# Patient Record
Sex: Female | Born: 1950 | Race: Black or African American | Hispanic: No | Smoking: Never smoker
Health system: Southern US, Community
[De-identification: ages and names within clinical notes are randomized; demographics above are authoritative.]

## PROBLEM LIST (undated history)

## (undated) DIAGNOSIS — J189 Pneumonia, unspecified organism: Secondary | ICD-10-CM

## (undated) DIAGNOSIS — E785 Hyperlipidemia, unspecified: Secondary | ICD-10-CM

## (undated) DIAGNOSIS — Z7902 Long term (current) use of antithrombotics/antiplatelets: Secondary | ICD-10-CM

## (undated) DIAGNOSIS — I2510 Atherosclerotic heart disease of native coronary artery without angina pectoris: Secondary | ICD-10-CM

## (undated) DIAGNOSIS — I447 Left bundle-branch block, unspecified: Secondary | ICD-10-CM

## (undated) DIAGNOSIS — R001 Bradycardia, unspecified: Secondary | ICD-10-CM

## (undated) DIAGNOSIS — K862 Cyst of pancreas: Secondary | ICD-10-CM

## (undated) DIAGNOSIS — M810 Age-related osteoporosis without current pathological fracture: Secondary | ICD-10-CM

## (undated) DIAGNOSIS — I441 Atrioventricular block, second degree: Secondary | ICD-10-CM

## (undated) DIAGNOSIS — I110 Hypertensive heart disease with heart failure: Secondary | ICD-10-CM

## (undated) DIAGNOSIS — D649 Anemia, unspecified: Secondary | ICD-10-CM

## (undated) DIAGNOSIS — Z7982 Long term (current) use of aspirin: Secondary | ICD-10-CM

## (undated) DIAGNOSIS — I5020 Unspecified systolic (congestive) heart failure: Secondary | ICD-10-CM

## (undated) DIAGNOSIS — R519 Headache, unspecified: Secondary | ICD-10-CM

## (undated) DIAGNOSIS — R5382 Chronic fatigue, unspecified: Secondary | ICD-10-CM

## (undated) DIAGNOSIS — I2089 Other forms of angina pectoris: Secondary | ICD-10-CM

## (undated) DIAGNOSIS — M47814 Spondylosis without myelopathy or radiculopathy, thoracic region: Secondary | ICD-10-CM

## (undated) DIAGNOSIS — D490 Neoplasm of unspecified behavior of digestive system: Secondary | ICD-10-CM

## (undated) DIAGNOSIS — Z950 Presence of cardiac pacemaker: Secondary | ICD-10-CM

## (undated) DIAGNOSIS — I509 Heart failure, unspecified: Secondary | ICD-10-CM

## (undated) DIAGNOSIS — I480 Paroxysmal atrial fibrillation: Secondary | ICD-10-CM

## (undated) DIAGNOSIS — M069 Rheumatoid arthritis, unspecified: Secondary | ICD-10-CM

## (undated) DIAGNOSIS — E559 Vitamin D deficiency, unspecified: Secondary | ICD-10-CM

## (undated) DIAGNOSIS — I10 Essential (primary) hypertension: Secondary | ICD-10-CM

## (undated) DIAGNOSIS — K8689 Other specified diseases of pancreas: Secondary | ICD-10-CM

## (undated) DIAGNOSIS — N6002 Solitary cyst of left breast: Secondary | ICD-10-CM

## (undated) DIAGNOSIS — I700 Atherosclerosis of aorta: Secondary | ICD-10-CM

## (undated) DIAGNOSIS — K219 Gastro-esophageal reflux disease without esophagitis: Secondary | ICD-10-CM

## (undated) DIAGNOSIS — I4729 Other ventricular tachycardia: Secondary | ICD-10-CM

## (undated) DIAGNOSIS — M1990 Unspecified osteoarthritis, unspecified site: Secondary | ICD-10-CM

## (undated) DIAGNOSIS — G720 Drug-induced myopathy: Secondary | ICD-10-CM

## (undated) HISTORY — PX: OTHER SURGICAL HISTORY: SHX169

## (undated) HISTORY — DX: Cyst of pancreas: K86.2

## (undated) HISTORY — DX: Essential (primary) hypertension: I10

## (undated) HISTORY — PX: HAND SURGERY: SHX662

## (undated) HISTORY — DX: Heart failure, unspecified: I50.9

---

## 1983-09-23 HISTORY — PX: ABDOMINAL HYSTERECTOMY: SHX81

## 2000-04-08 ENCOUNTER — Ambulatory Visit (HOSPITAL_COMMUNITY): Admission: RE | Admit: 2000-04-08 | Discharge: 2000-04-08 | Payer: Self-pay | Admitting: Internal Medicine

## 2000-09-22 HISTORY — PX: BREAST BIOPSY: SHX20

## 2002-08-31 ENCOUNTER — Encounter: Payer: Self-pay | Admitting: Internal Medicine

## 2002-08-31 ENCOUNTER — Ambulatory Visit (HOSPITAL_COMMUNITY): Admission: RE | Admit: 2002-08-31 | Discharge: 2002-08-31 | Payer: Self-pay | Admitting: Internal Medicine

## 2004-07-12 ENCOUNTER — Other Ambulatory Visit: Payer: Self-pay

## 2004-07-19 ENCOUNTER — Ambulatory Visit: Payer: Self-pay | Admitting: Internal Medicine

## 2004-08-22 ENCOUNTER — Ambulatory Visit: Payer: Self-pay | Admitting: Specialist

## 2005-02-03 ENCOUNTER — Ambulatory Visit: Payer: Self-pay | Admitting: Internal Medicine

## 2005-02-04 ENCOUNTER — Ambulatory Visit: Payer: Self-pay | Admitting: Internal Medicine

## 2005-03-12 ENCOUNTER — Ambulatory Visit: Payer: Self-pay

## 2006-01-22 ENCOUNTER — Ambulatory Visit: Payer: Self-pay

## 2006-02-12 ENCOUNTER — Ambulatory Visit: Payer: Self-pay | Admitting: Gastroenterology

## 2006-02-16 ENCOUNTER — Ambulatory Visit: Payer: Self-pay | Admitting: Vascular Surgery

## 2006-03-19 ENCOUNTER — Ambulatory Visit: Payer: Self-pay | Admitting: Gastroenterology

## 2006-04-08 ENCOUNTER — Ambulatory Visit: Payer: Self-pay

## 2006-12-10 ENCOUNTER — Ambulatory Visit: Payer: Self-pay

## 2007-04-06 ENCOUNTER — Ambulatory Visit: Payer: Self-pay

## 2007-05-15 ENCOUNTER — Emergency Department: Payer: Self-pay | Admitting: Emergency Medicine

## 2007-09-25 ENCOUNTER — Ambulatory Visit: Payer: Self-pay | Admitting: Family Medicine

## 2007-12-24 ENCOUNTER — Observation Stay: Payer: Self-pay | Admitting: Internal Medicine

## 2007-12-24 ENCOUNTER — Other Ambulatory Visit: Payer: Self-pay

## 2008-04-04 ENCOUNTER — Ambulatory Visit: Payer: Self-pay | Admitting: Internal Medicine

## 2008-09-22 HISTORY — PX: INCISION AND DRAINAGE: SHX5863

## 2009-04-18 ENCOUNTER — Ambulatory Visit: Payer: Self-pay | Admitting: Family Medicine

## 2009-07-10 ENCOUNTER — Ambulatory Visit: Payer: Self-pay

## 2009-12-13 ENCOUNTER — Ambulatory Visit: Payer: Self-pay | Admitting: Internal Medicine

## 2010-04-18 ENCOUNTER — Ambulatory Visit: Payer: Self-pay | Admitting: Internal Medicine

## 2010-07-01 ENCOUNTER — Ambulatory Visit: Payer: Self-pay | Admitting: Internal Medicine

## 2010-09-25 ENCOUNTER — Emergency Department: Payer: Self-pay | Admitting: Emergency Medicine

## 2011-04-22 ENCOUNTER — Ambulatory Visit: Payer: Self-pay | Admitting: Internal Medicine

## 2011-06-10 ENCOUNTER — Emergency Department: Payer: Self-pay | Admitting: Unknown Physician Specialty

## 2011-07-21 ENCOUNTER — Ambulatory Visit: Payer: Self-pay | Admitting: Gastroenterology

## 2011-08-25 ENCOUNTER — Ambulatory Visit: Payer: Self-pay | Admitting: Gastroenterology

## 2011-11-12 IMAGING — NM NUCLEAR MEDICINE HEPATOHBILIARY INCLUDE GB
3 series · 20 of 20 positions shown · non-contrast
Comparison: none

REASON FOR EXAM: abd pain LUQ  unspecified constipation  nausea w vomiting
COMMENTS:

[Series 1000: gallbladder dynamic · 4.80mm/px · 6 of 60 frames shown]
[frame 6/60]
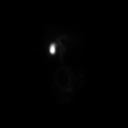
[frame 16/60]
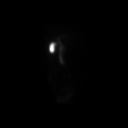
[frame 26/60]
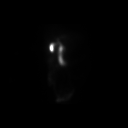
[frame 36/60]
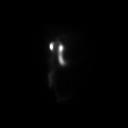
[frame 46/60]
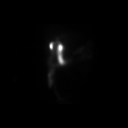
[frame 56/60]
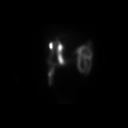

[Series 1000: gallbladder dynamic (results) · 4.80mm/px · 6 of 60 frames shown]
[frame 6/60]
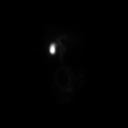
[frame 16/60]
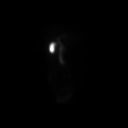
[frame 26/60]
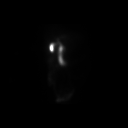
[frame 36/60]
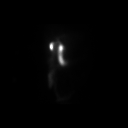
[frame 46/60]
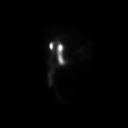
[frame 56/60]
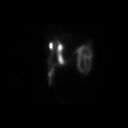

[Series 1000: gallbladder statics · 4.80mm/px · 8 of 8 slices shown]
[im 1/8]
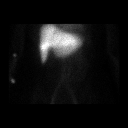
[im 2/8]
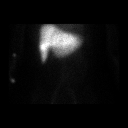
[im 3/8]
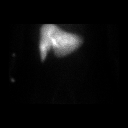
[im 4/8]
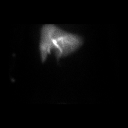
[im 5/8]
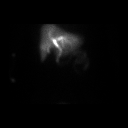
[im 6/8]
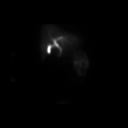
[im 7/8]
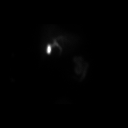
[im 8/8]
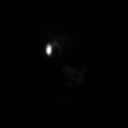

[20 of 20 positions shown; findings below may reference images not displayed]

PROCEDURE:     KNM - KNM HEPATO W/GB EJECT FRACTION  - July 21, 2011 [DATE]

RESULT:     Following intravenous administration of 8.29 mCi technetium 99m
Choletec, there is noted prompt visualization of tracer activity in the
liver at 3 minutes. At 30 minutes, tracer activity is visualized in the
gallbladder, common duct and proximal small bowel.

The gallbladder ejection fraction measures 85% which is in the normal range.
IMPRESSION: 1.  Normal Hepatobiliary Scan.
2.  Gallbladder ejection fraction measures 85% which is in the normal range.

## 2012-03-24 ENCOUNTER — Ambulatory Visit: Payer: Self-pay | Admitting: Sports Medicine

## 2012-04-22 ENCOUNTER — Ambulatory Visit: Payer: Self-pay | Admitting: Internal Medicine

## 2012-12-22 ENCOUNTER — Ambulatory Visit: Payer: Self-pay | Admitting: Family Medicine

## 2013-01-11 ENCOUNTER — Ambulatory Visit: Payer: Self-pay | Admitting: Internal Medicine

## 2013-03-31 ENCOUNTER — Ambulatory Visit: Payer: Self-pay | Admitting: Internal Medicine

## 2013-04-26 ENCOUNTER — Ambulatory Visit: Payer: Self-pay | Admitting: Internal Medicine

## 2013-12-23 ENCOUNTER — Ambulatory Visit: Payer: Self-pay | Admitting: Internal Medicine

## 2013-12-28 ENCOUNTER — Ambulatory Visit: Payer: Self-pay | Admitting: Internal Medicine

## 2014-07-12 ENCOUNTER — Ambulatory Visit: Payer: Self-pay | Admitting: Internal Medicine

## 2014-07-13 ENCOUNTER — Ambulatory Visit: Payer: Self-pay | Admitting: Internal Medicine

## 2014-07-13 DIAGNOSIS — I639 Cerebral infarction, unspecified: Secondary | ICD-10-CM

## 2014-07-13 HISTORY — DX: Cerebral infarction, unspecified: I63.9

## 2014-07-21 DIAGNOSIS — I340 Nonrheumatic mitral (valve) insufficiency: Secondary | ICD-10-CM | POA: Insufficient documentation

## 2014-07-28 ENCOUNTER — Ambulatory Visit: Payer: Self-pay | Admitting: Internal Medicine

## 2014-08-11 DIAGNOSIS — Z8739 Personal history of other diseases of the musculoskeletal system and connective tissue: Secondary | ICD-10-CM | POA: Insufficient documentation

## 2014-09-01 ENCOUNTER — Ambulatory Visit: Payer: Self-pay | Admitting: Internal Medicine

## 2014-09-08 DIAGNOSIS — R1013 Epigastric pain: Secondary | ICD-10-CM | POA: Insufficient documentation

## 2014-09-08 DIAGNOSIS — K8689 Other specified diseases of pancreas: Secondary | ICD-10-CM | POA: Insufficient documentation

## 2014-09-08 DIAGNOSIS — R932 Abnormal findings on diagnostic imaging of liver and biliary tract: Secondary | ICD-10-CM | POA: Insufficient documentation

## 2014-09-13 DIAGNOSIS — R001 Bradycardia, unspecified: Secondary | ICD-10-CM | POA: Insufficient documentation

## 2014-09-18 ENCOUNTER — Ambulatory Visit: Payer: Self-pay | Admitting: Internal Medicine

## 2014-09-18 ENCOUNTER — Ambulatory Visit: Payer: Self-pay | Admitting: Unknown Physician Specialty

## 2014-09-22 HISTORY — PX: FOOT SURGERY: SHX648

## 2014-10-19 ENCOUNTER — Ambulatory Visit: Payer: Self-pay | Admitting: Gastroenterology

## 2014-10-23 LAB — AMYLASE, BODY FLUID: Amylase, Body Fluid: 9970 U/L

## 2014-12-25 DIAGNOSIS — K862 Cyst of pancreas: Secondary | ICD-10-CM | POA: Insufficient documentation

## 2015-01-15 LAB — SURGICAL PATHOLOGY

## 2015-01-15 LAB — CYTOLOGY - NON PAP

## 2015-03-07 DIAGNOSIS — I10 Essential (primary) hypertension: Secondary | ICD-10-CM | POA: Insufficient documentation

## 2015-03-20 DIAGNOSIS — R42 Dizziness and giddiness: Secondary | ICD-10-CM | POA: Insufficient documentation

## 2015-03-20 DIAGNOSIS — I493 Ventricular premature depolarization: Secondary | ICD-10-CM | POA: Insufficient documentation

## 2015-03-28 ENCOUNTER — Encounter: Payer: Self-pay | Admitting: General Surgery

## 2015-03-28 ENCOUNTER — Ambulatory Visit (INDEPENDENT_AMBULATORY_CARE_PROVIDER_SITE_OTHER): Payer: PRIVATE HEALTH INSURANCE | Admitting: General Surgery

## 2015-03-28 VITALS — BP 140/80 | HR 74 | Resp 12 | Ht 66.0 in | Wt 193.0 lb

## 2015-03-28 DIAGNOSIS — N611 Abscess of the breast and nipple: Secondary | ICD-10-CM

## 2015-03-28 DIAGNOSIS — N61 Inflammatory disorders of breast: Secondary | ICD-10-CM

## 2015-03-28 NOTE — Progress Notes (Signed)
Patient ID: Sara Hodges, female   DOB: 12/31/1950, 64 y.o.   MRN: [id number]  Chief Complaint  Patient presents with  . Other    cyst under left breast    HPI Sara Hodges is a 64 y.o. female here for a recurrent inflamed sebaceous cyst under left breast. She is currently on antibiotics for this. Patient states she noticed this about a week ago. The area is sore but not draining. Patient has had this area drained at [facility] about 5 years ago.  HPI  Past Medical History  Diagnosis Date  . Pancreatic cyst   . Hypertension   . CHF (congestive heart failure)     Past Surgical History  Procedure Laterality Date  . Abdominal hysterectomy  1985  . Hand surgery    . Foot surgery  2016  . Incision and drainage Left 2010    breast     Family History  Problem Relation Age of Onset  . Lung cancer Mother     Social History History  Substance Use Topics  . Smoking status: Never Smoker   . Smokeless tobacco: Never Used  . Alcohol Use: No    Allergies  Allergen Reactions  . Amoxicillin-Pot Clavulanate Nausea Only    Current Outpatient Prescriptions  Medication Sig Dispense Refill  . aspirin EC 81 MG tablet Take 81 mg by mouth daily.     Marland Kitchen doxycycline (VIBRAMYCIN) 100 MG capsule Take 100 mg by mouth 2 (two) times daily.     . folic acid (FOLVITE) 1 MG tablet Take 1 mg by mouth daily.     . furosemide (LASIX) 20 MG tablet Take 20 mg by mouth as needed.     Marland Kitchen lisinopril (PRINIVIL,ZESTRIL) 20 MG tablet Take 20 mg by mouth daily.     Marland Kitchen scopolamine (TRANSDERM-SCOP) 1 MG/3DAYS Place 1 patch onto the skin once as needed.     Marland Kitchen spironolactone (ALDACTONE) 25 MG tablet Take 25 mg by mouth daily.     . Vitamin D, Ergocalciferol, (DRISDOL) 50000 UNITS CAPS capsule Take by mouth.     No current facility-administered medications for this visit.    Review of Systems Review of Systems  Constitutional: Negative.   Respiratory: Negative.   Cardiovascular: Negative.     Blood pressure  140/80, pulse 74, resp. rate 12, height  (1.676 m), weight 193 lb (87.544 kg).  Physical Exam Physical Exam  Constitutional: She is oriented to person, place, and time. She appears well-developed and well-nourished.  HENT:  Mouth/Throat: Oropharynx is clear and moist.  Eyes: Conjunctivae are normal. No scleral icterus.  Neck: Neck supple.  Pulmonary/Chest: Effort normal and breath sounds normal. Left breast exhibits mass. Left breast exhibits no inverted nipple, no nipple discharge and no skin change.    Lymphadenopathy:    She has no cervical adenopathy.    She has axillary adenopathy (left mid-axillary node 2.5 cm firm, mobile. Mildly tender.).  Neurological: She is alert and oriented to person, place, and time.  Skin: Skin is warm and dry.    Data Reviewed    Assessment    Abscess of left breast-likely cutaneous cyst.  I and D recommended and pt was agreeable.    Plan   Procedure note.                               The abscess area was prepped with chloro prep and draped. 1ml 1%  xylocaine instilled. Cruciate incision was made and 3ml of thick yellow pus and some cyst contents drained. Corners of incision trimmed.  Dressed with 4/4s.  Continue doxycycline BID x 10 days.      PCP: Dr Fraser DinJeffrey Sparks   Hudsyn Barich G 03/28/2015, 6:29 PM

## 2015-03-28 NOTE — Patient Instructions (Signed)
Continue antibiotics. Keep dressing on it. The patient is aware to call back for any questions or concerns.

## 2015-04-11 ENCOUNTER — Encounter: Payer: Self-pay | Admitting: General Surgery

## 2015-04-11 ENCOUNTER — Ambulatory Visit (INDEPENDENT_AMBULATORY_CARE_PROVIDER_SITE_OTHER): Payer: PRIVATE HEALTH INSURANCE | Admitting: General Surgery

## 2015-04-11 VITALS — BP 152/84 | HR 82 | Resp 12 | Ht 66.0 in | Wt 192.0 lb

## 2015-04-11 DIAGNOSIS — L723 Sebaceous cyst: Secondary | ICD-10-CM | POA: Diagnosis not present

## 2015-04-11 DIAGNOSIS — L089 Local infection of the skin and subcutaneous tissue, unspecified: Secondary | ICD-10-CM

## 2015-04-11 MED ORDER — DOXYCYCLINE HYCLATE 100 MG PO CAPS: 100.0000 mg | ORAL_CAPSULE | Freq: Two times a day (BID) | ORAL | Status: DC

## 2015-04-11 NOTE — Patient Instructions (Signed)
The patient is aware to call back for any questions or concerns.  

## 2015-04-11 NOTE — Progress Notes (Signed)
Here today for follow up left breast abscess. She has finished the antibiotics. She states the area is still draining some, soreness is improving. The prior large abscess is now down to a 1.5cm cutaneous cyst-small opening where some cyst contents noted. Also saw a couple of drops of pus.  Infected sebaceous cyst.  Doxycycline 100 mg for 2 weeks. Follow up in 2 weeks for excision.

## 2015-04-26 ENCOUNTER — Ambulatory Visit (INDEPENDENT_AMBULATORY_CARE_PROVIDER_SITE_OTHER): Payer: PRIVATE HEALTH INSURANCE | Admitting: General Surgery

## 2015-04-26 ENCOUNTER — Encounter: Payer: Self-pay | Admitting: General Surgery

## 2015-04-26 VITALS — BP 142/64 | HR 80 | Resp 12 | Ht 66.0 in | Wt 195.0 lb

## 2015-04-26 DIAGNOSIS — N6082 Other benign mammary dysplasias of left breast: Secondary | ICD-10-CM

## 2015-04-26 NOTE — Patient Instructions (Addendum)
Monitor the area Call if symptoms appear The patient is aware to call back for any questions or concerns.

## 2015-04-26 NOTE — Progress Notes (Signed)
Here today for excision of a left breast mass. Pt states the area in left breast is no longer visible or palpable. Exam shows no residual cyst in left breast where she had recent infection.  Only a 4mm scar is seen at site of prior drainage.No underlying palpable finding. Advised to call if  there is any recurrence of the same.

## 2015-06-03 DIAGNOSIS — K862 Cyst of pancreas: Secondary | ICD-10-CM | POA: Insufficient documentation

## 2015-08-02 DIAGNOSIS — I43 Cardiomyopathy in diseases classified elsewhere: Secondary | ICD-10-CM

## 2015-08-02 DIAGNOSIS — I110 Hypertensive heart disease with heart failure: Secondary | ICD-10-CM | POA: Insufficient documentation

## 2015-08-31 ENCOUNTER — Other Ambulatory Visit: Payer: Self-pay | Admitting: Internal Medicine

## 2015-08-31 DIAGNOSIS — Z1231 Encounter for screening mammogram for malignant neoplasm of breast: Secondary | ICD-10-CM

## 2015-09-20 ENCOUNTER — Ambulatory Visit
Admission: RE | Admit: 2015-09-20 | Discharge: 2015-09-20 | Disposition: A | Discharge status: Home / Self Care | Payer: Medicare Other | Source: Ambulatory Visit | Attending: Internal Medicine | Admitting: Internal Medicine

## 2015-09-20 DIAGNOSIS — Z1231 Encounter for screening mammogram for malignant neoplasm of breast: Secondary | ICD-10-CM | POA: Diagnosis present

## 2015-11-19 ENCOUNTER — Other Ambulatory Visit: Payer: Self-pay | Admitting: Emergency Medicine

## 2015-11-20 LAB — CULTURE, BLOOD (ROUTINE X 2)
CULTURE: NO GROWTH
CULTURE: NO GROWTH
Culture: NO GROWTH
Culture: NO GROWTH

## 2015-11-20 LAB — URINE CULTURE: Culture: >=100000

## 2015-11-21 LAB — CULTURE, BLOOD (ROUTINE X 2)
CULTURE: NO GROWTH
CULTURE: NO GROWTH
Culture: NO GROWTH
Culture: NO GROWTH
Culture: NO GROWTH

## 2015-11-22 LAB — URINE CULTURE
Culture: >=100000
Culture: >=100000

## 2015-11-23 LAB — CULTURE, BLOOD (ROUTINE X 2)
CULTURE: NO GROWTH
Culture: NO GROWTH

## 2015-11-24 LAB — CULTURE, BLOOD (ROUTINE X 2)
CULTURE: NO GROWTH
CULTURE: NO GROWTH
CULTURE: NO GROWTH
Culture: NO GROWTH

## 2016-03-25 ENCOUNTER — Encounter: Payer: Self-pay | Admitting: Emergency Medicine

## 2016-03-25 ENCOUNTER — Emergency Department: Payer: Medicare HMO

## 2016-03-25 ENCOUNTER — Inpatient Hospital Stay
Admission: EM | Admit: 2016-03-25 | Discharge: 2016-03-27 | DRG: 871 | Disposition: A | Discharge status: Home / Self Care | Payer: Medicare HMO | Attending: Internal Medicine | Admitting: Internal Medicine

## 2016-03-25 DIAGNOSIS — J189 Pneumonia, unspecified organism: Secondary | ICD-10-CM | POA: Diagnosis present

## 2016-03-25 DIAGNOSIS — I110 Hypertensive heart disease with heart failure: Secondary | ICD-10-CM | POA: Diagnosis present

## 2016-03-25 DIAGNOSIS — K862 Cyst of pancreas: Secondary | ICD-10-CM | POA: Diagnosis present

## 2016-03-25 DIAGNOSIS — A419 Sepsis, unspecified organism: Secondary | ICD-10-CM | POA: Diagnosis present

## 2016-03-25 DIAGNOSIS — E785 Hyperlipidemia, unspecified: Secondary | ICD-10-CM | POA: Diagnosis present

## 2016-03-25 DIAGNOSIS — I2510 Atherosclerotic heart disease of native coronary artery without angina pectoris: Secondary | ICD-10-CM | POA: Diagnosis present

## 2016-03-25 DIAGNOSIS — Z801 Family history of malignant neoplasm of trachea, bronchus and lung: Secondary | ICD-10-CM | POA: Diagnosis not present

## 2016-03-25 DIAGNOSIS — I5042 Chronic combined systolic (congestive) and diastolic (congestive) heart failure: Secondary | ICD-10-CM | POA: Diagnosis present

## 2016-03-25 DIAGNOSIS — I10 Essential (primary) hypertension: Secondary | ICD-10-CM | POA: Diagnosis present

## 2016-03-25 DIAGNOSIS — J690 Pneumonitis due to inhalation of food and vomit: Secondary | ICD-10-CM

## 2016-03-25 DIAGNOSIS — K529 Noninfective gastroenteritis and colitis, unspecified: Secondary | ICD-10-CM

## 2016-03-25 DIAGNOSIS — Z880 Allergy status to penicillin: Secondary | ICD-10-CM

## 2016-03-25 DIAGNOSIS — Z7982 Long term (current) use of aspirin: Secondary | ICD-10-CM | POA: Diagnosis not present

## 2016-03-25 HISTORY — DX: Hyperlipidemia, unspecified: E78.5

## 2016-03-25 HISTORY — DX: Atherosclerotic heart disease of native coronary artery without angina pectoris: I25.10

## 2016-03-25 HISTORY — DX: Other specified diseases of pancreas: K86.89

## 2016-03-25 LAB — COMPREHENSIVE METABOLIC PANEL
ALBUMIN: 3.7 g/dL (ref 3.5–5.0)
ALK PHOS: 71 U/L (ref 38–126)
ALT: 56 U/L — AB (ref 14–54)
ANION GAP: 13 (ref 5–15)
AST: 54 U/L — AB (ref 15–41)
BUN: 16 mg/dL (ref 6–20)
CALCIUM: 8.9 mg/dL (ref 8.9–10.3)
CO2: 24 mmol/L (ref 22–32)
CREATININE: 1.06 mg/dL — AB (ref 0.44–1.00)
Chloride: 98 mmol/L — ABNORMAL LOW (ref 101–111)
GFR calc Af Amer: >60 mL/min (ref >60)
GFR calc non Af Amer: 54 mL/min — ABNORMAL LOW (ref >60)
GLUCOSE: 118 mg/dL — AB (ref 65–99)
Potassium: 3.8 mmol/L (ref 3.5–5.1)
SODIUM: 135 mmol/L (ref 135–145)
Total Bilirubin: 1.2 mg/dL (ref 0.3–1.2)
Total Protein: 8.6 g/dL — ABNORMAL HIGH (ref 6.5–8.1)

## 2016-03-25 LAB — URINALYSIS COMPLETE WITH MICROSCOPIC (ARMC ONLY)
Bilirubin Urine: NEGATIVE
Cellular Cast, UA: 3
Glucose, UA: 50 mg/dL — AB
Nitrite: NEGATIVE
PH: 5 (ref 5.0–8.0)
SPECIFIC GRAVITY, URINE: 1.024 (ref 1.005–1.030)

## 2016-03-25 LAB — CBC WITH DIFFERENTIAL/PLATELET
Basophils Absolute: 0 10*3/uL (ref 0–0.1)
Basophils Relative: 0 %
EOS ABS: 0 10*3/uL (ref 0–0.7)
Eosinophils Relative: 0 %
HCT: 38 % (ref 35.0–47.0)
HEMOGLOBIN: 13 g/dL (ref 12.0–16.0)
LYMPHS ABS: 0.5 10*3/uL — AB (ref 1.0–3.6)
Lymphocytes Relative: 6 %
MCH: 32.6 pg (ref 26.0–34.0)
MCHC: 34.3 g/dL (ref 32.0–36.0)
MCV: 95.1 fL (ref 80.0–100.0)
MONO ABS: 0.7 10*3/uL (ref 0.2–0.9)
MONOS PCT: 9 %
NEUTROS PCT: 85 %
Neutro Abs: 6.3 10*3/uL (ref 1.4–6.5)
Platelets: 164 10*3/uL (ref 150–440)
RBC: 4 MIL/uL (ref 3.80–5.20)
RDW: 12.9 % (ref 11.5–14.5)
WBC: 7.4 10*3/uL (ref 3.6–11.0)

## 2016-03-25 LAB — TROPONIN I
TROPONIN I: 0.06 ng/mL — AB (ref <0.03)
Troponin I: <0.03 ng/mL (ref <0.03)

## 2016-03-25 LAB — LACTIC ACID, PLASMA: LACTIC ACID, VENOUS: 0.7 mmol/L (ref 0.5–1.9)

## 2016-03-25 LAB — BRAIN NATRIURETIC PEPTIDE: B NATRIURETIC PEPTIDE 5: 133 pg/mL — AB (ref 0.0–100.0)

## 2016-03-25 LAB — LIPASE, BLOOD: Lipase: 22 U/L (ref 11–51)

## 2016-03-25 MED ORDER — ENOXAPARIN SODIUM 40 MG/0.4ML ~~LOC~~ SOLN
40.0000 mg | SUBCUTANEOUS | Status: DC
  Administered 2016-03-25 – 2016-03-26 (×2): 40 mg via SUBCUTANEOUS
  Filled 2016-03-25 (×2): qty 0.4

## 2016-03-25 MED ORDER — ONDANSETRON 8 MG PO TBDP: 8.0000 mg | ORAL_TABLET | Freq: Once | ORAL | Status: DC

## 2016-03-25 MED ORDER — ONDANSETRON HCL 4 MG PO TABS: 4.0000 mg | ORAL_TABLET | Freq: Four times a day (QID) | ORAL | Status: DC | PRN

## 2016-03-25 MED ORDER — ONDANSETRON HCL 4 MG/2ML IJ SOLN
4.0000 mg | Freq: Once | INTRAMUSCULAR | Status: AC
  Administered 2016-03-25: 4 mg via INTRAVENOUS
  Filled 2016-03-25: qty 2

## 2016-03-25 MED ORDER — ACETAMINOPHEN 650 MG RE SUPP: 650.0000 mg | Freq: Four times a day (QID) | RECTAL | Status: DC | PRN

## 2016-03-25 MED ORDER — BENZONATATE 100 MG PO CAPS
200.0000 mg | ORAL_CAPSULE | Freq: Three times a day (TID) | ORAL | Status: DC | PRN
  Administered 2016-03-26: 200 mg via ORAL
  Filled 2016-03-25: qty 2

## 2016-03-25 MED ORDER — ACETAMINOPHEN 325 MG PO TABS
650.0000 mg | ORAL_TABLET | Freq: Four times a day (QID) | ORAL | Status: DC | PRN
Start: 2016-03-25 — End: 2016-03-27
  Administered 2016-03-26 – 2016-03-27 (×2): 650 mg via ORAL
  Filled 2016-03-25 (×2): qty 2

## 2016-03-25 MED ORDER — DEXTROSE 5 % IV SOLN
500.0000 mg | INTRAVENOUS | Status: DC
  Administered 2016-03-26: 23:00:00 500 mg via INTRAVENOUS
  Filled 2016-03-25 (×2): qty 500

## 2016-03-25 MED ORDER — SODIUM CHLORIDE 0.9% FLUSH
3.0000 mL | Freq: Two times a day (BID) | INTRAVENOUS | Status: DC
  Administered 2016-03-25 – 2016-03-26 (×2): 3 mL via INTRAVENOUS

## 2016-03-25 MED ORDER — SODIUM CHLORIDE 0.9 % IV BOLUS (SEPSIS)
1000.0000 mL | Freq: Once | INTRAVENOUS | Status: AC
  Administered 2016-03-25: 1000 mL via INTRAVENOUS

## 2016-03-25 MED ORDER — AZITHROMYCIN 500 MG IV SOLR
500.0000 mg | Freq: Once | INTRAVENOUS | Status: AC
  Administered 2016-03-25: 500 mg via INTRAVENOUS
  Filled 2016-03-25: qty 500

## 2016-03-25 MED ORDER — ONDANSETRON HCL 4 MG/2ML IJ SOLN: 4.0000 mg | Freq: Four times a day (QID) | INTRAMUSCULAR | Status: DC | PRN

## 2016-03-25 MED ORDER — ACETAMINOPHEN 325 MG PO TABS
650.0000 mg | ORAL_TABLET | Freq: Once | ORAL | Status: AC
  Administered 2016-03-25: 650 mg via ORAL
  Filled 2016-03-25: qty 2

## 2016-03-25 MED ORDER — DEXTROSE 5 % IV SOLN
1.0000 g | Freq: Once | INTRAVENOUS | Status: AC
  Administered 2016-03-25: 1 g via INTRAVENOUS
  Filled 2016-03-25: qty 10

## 2016-03-25 MED ORDER — DEXTROSE 5 % IV SOLN
1.0000 g | INTRAVENOUS | Status: DC
  Administered 2016-03-26: 1 g via INTRAVENOUS
  Filled 2016-03-25 (×2): qty 10

## 2016-03-25 MED ORDER — IPRATROPIUM-ALBUTEROL 0.5-2.5 (3) MG/3ML IN SOLN: 3.0000 mL | RESPIRATORY_TRACT | Status: DC | PRN

## 2016-03-25 MED ORDER — OXYCODONE HCL 5 MG PO TABS
5.0000 mg | ORAL_TABLET | ORAL | Status: DC | PRN
Start: 2016-03-25 — End: 2016-03-27

## 2016-03-25 NOTE — ED Notes (Signed)
Pt reports having had a fever at home since Saturday that has been treatment with tylenol with short periods of decrease but fever continues to come back.

## 2016-03-25 NOTE — ED Notes (Signed)
Pt presents with vomiting x 3 days. Pt reports fever and chills, stating she took tylenol last night. Pt states she cannot keep anything down. Congestion, cough, body aches also. NAD noted.

## 2016-03-25 NOTE — Progress Notes (Signed)
ANTIBIOTIC CONSULT NOTE - INITIAL  Pharmacy Consult for Ceftriaxone  Indication: pneumonia  Allergies  Allergen Reactions  . Amoxicillin-Pot Clavulanate Nausea Only and Other (See Comments)    Has patient had a PCN reaction causing immediate rash, facial/tongue/throat swelling, SOB or lightheadedness with hypotension: No Has patient had a PCN reaction causing severe rash involving mucus membranes or skin necrosis: No Has patient had a PCN reaction that required hospitalization No Has patient had a PCN reaction occurring within the last 10 years: unknown If all of the above answers are "NO", then may proceed with Cephalosporin use.     Patient Measurements: Height: 5' 6.5" (168.9 cm) Weight: 195 lb (88.451 kg) IBW/kg (Calculated) : 60.45 Adjusted Body Weight:   Vital Signs: Temp: 98.6 F (37 C) (07/04 2230) Temp Source: Oral (07/04 2230) BP: 124/62 mmHg (07/04 2230) Pulse Rate: 78 (07/04 2230) Intake/Output from previous day:   Intake/Output from this shift:    Labs:  Recent Labs  03/25/16 1837  WBC 7.4  HGB 13.0  PLT 164  CREATININE 1.06*   Estimated Creatinine Clearance: 59.9 mL/min (by C-G formula based on Cr of 1.06). No results for input(s): VANCOTROUGH, VANCOPEAK, VANCORANDOM, GENTTROUGH, GENTPEAK, GENTRANDOM, TOBRATROUGH, TOBRAPEAK, TOBRARND, AMIKACINPEAK, AMIKACINTROU, AMIKACIN in the last 72 hours.   Microbiology: No results found for this or any previous visit (from the past 720 hour(s)).  Medical History: Past Medical History  Diagnosis Date  . Pancreatic cyst   . Hypertension   . CHF (congestive heart failure) (HCC)   . Pancreatic mass   . HLD (hyperlipidemia)   . CAD (coronary artery disease)     Medications:  Prescriptions prior to admission  Medication Sig Dispense Refill Last Dose  . aspirin EC 81 MG tablet Take 81 mg by mouth daily.    03/24/2016 at Unknown time  . ENTRESTO 97-103 MG Take 1 tablet by mouth 2 (two) times daily.   03/24/2016  at Unknown time  . folic acid (FOLVITE) 1 MG tablet Take 1 mg by mouth daily.   03/24/2016 at Unknown time  . furosemide (LASIX) 20 MG tablet Take 20 mg by mouth daily as needed for fluid.   prn at prn  . spironolactone (ALDACTONE) 25 MG tablet Take 25 mg by mouth daily.   03/24/2016 at Unknown time  . Vitamin D, Ergocalciferol, (DRISDOL) 50000 UNITS CAPS capsule Take 50,000 Units by mouth every 7 (seven) days. Takes on Sunday   Past Week at Unknown time   Assessment: CrCl = 59.9 ml/min   Goal of Therapy:  resolution of infection  Plan:  Ceftriaxone 1 gm IV Q24H currently ordered.  No dose adjustment needed.   Sara Hodges D 03/25/2016,10:33 PM

## 2016-03-25 NOTE — ED Provider Notes (Addendum)
[facility] Emergency Department Provider Note  ____________________________________________   I have reviewed the triage vital signs and the nursing notes.   HISTORY  Chief Complaint Emesis    HPI Sara Hodges is a 65 y.o. female with a history of CHF, and a cyst on her pancreas which is not known to be cancerous, presents today with pain with nausea vomiting diarrhea for 3 days. She has had fever and chills. No recent travel or recent antibiotics. Patient has also had a runny nose and cough. She states she does not feels markedly short of breath. She has had multiple episodes of nonbloody and non-melanotic diarrhea,and she has had multiple episodes of nonbloody nonbilious vomiting. She vomited a few times today, had 1 episode of loose stool today. She denies any chest pain or shortness of breath. Denies significant headache. Denies numbness or weakness. States that she has no particular abdominal pain just occasional cramping before the diarrhea. No sick contacts.      Past Medical History  Diagnosis Date  . Pancreatic cyst   . Hypertension   . CHF (congestive heart failure) (HCC)   . Pancreatic mass     There are no active problems to display for this patient.   Past Surgical History  Procedure Laterality Date  . Abdominal hysterectomy  1985  . Hand surgery    . Foot surgery  2016  . Incision and drainage Left 2010    breast   . Breast biopsy Right 2002    negative    Current Outpatient Rx  Name  Route  Sig  Dispense  Refill  . aspirin EC 81 MG tablet   Oral   Take 81 mg by mouth daily.          . Vitamin D, Ergocalciferol, (DRISDOL) 50000 UNITS CAPS capsule   Oral   Take by mouth.         . doxycycline (VIBRAMYCIN) 100 MG capsule   Oral   Take 1 capsule (100 mg total) by mouth 2 (two) times daily.   14 capsule   0   . EXPIRED: furosemide (LASIX) 20 MG tablet   Oral   Take 20 mg by mouth as needed.          Marland Kitchen. EXPIRED:  lisinopril (PRINIVIL,ZESTRIL) 20 MG tablet   Oral   Take 20 mg by mouth daily.          Marland Kitchen. EXPIRED: spironolactone (ALDACTONE) 25 MG tablet   Oral   Take 25 mg by mouth daily.            Allergies Amoxicillin-pot clavulanate  Family History  Problem Relation Age of Onset  . Lung cancer Mother     Social History Social History  Substance Use Topics  . Smoking status: Never Smoker   . Smokeless tobacco: Never Used  . Alcohol Use: No    Review of Systems Constitutional: Positive fever/chills Eyes: No visual changes. ENT: No sore throat. No stiff neck no neck pain Cardiovascular: Denies chest pain. Respiratory: Denies shortness of breath. Positive cough Gastrointestinal:   See history of present illness Genitourinary: Negative for dysuria. Musculoskeletal: Negative lower extremity swelling Skin: Negative for rash. Neurological: Negative for headaches, focal weakness or numbness. 10-point ROS otherwise negative.  ____________________________________________   PHYSICAL EXAM:  VITAL SIGNS: ED Triage Vitals  Enc Vitals Group     BP 03/25/16 1713 144/75 mmHg     Pulse Rate 03/25/16 1713 107     Resp  03/25/16 1713 20     Temp 03/25/16 1713 101.4 F (38.6 C)     Temp Source 03/25/16 1713 Oral     SpO2 03/25/16 1713 98 %     Weight 03/25/16 1713 195 lb (88.451 kg)     Height 03/25/16 1713 5' 6.5" (1.689 m)     Head Cir --      Peak Flow --      Pain Score 03/25/16 1714 8     Pain Loc --      Pain Edu? --      Excl. in GC? --     Constitutional: Alert and oriented. Well appearing and in no acute distress.Appears that she does not feel well but does not appear toxic Eyes: Conjunctivae are normal. PERRL. EOMI. Head: Atraumatic. Nose: No congestion/rhinnorhea. Mouth/Throat: Mucous membranes are Somewhat dry.  Oropharynx non-erythematous. Neck: No stridor.   Nontender with no meningismus Cardiovascular: Normal rate, regular rhythm. Grossly normal heart sounds.   Good peripheral circulation. Respiratory: Normal respiratory effort.  No retractions. Lungs CTAB. Occasional cough noted no focal abnormality detected Abdominal: Soft and nontender. No distention. No guarding no rebound Back:  There is no focal tenderness or step off there is no midline tenderness there are no lesions noted. there is no CVA tenderness Musculoskeletal: No lower extremity tenderness. No joint effusions, no DVT signs strong distal pulses no edema Neurologic:  Normal speech and language. No gross focal neurologic deficits are appreciated.  Skin:  Skin is warm, dry and intact. No rash noted. Psychiatric: Mood and affect are normal. Speech and behavior are normal.  ____________________________________________   LABS (all labs ordered are listed, but only abnormal results are displayed)  Labs Reviewed  COMPREHENSIVE METABOLIC PANEL  LIPASE, BLOOD  CBC WITH DIFFERENTIAL/PLATELET  TROPONIN I   ____________________________________________  EKG  I personally interpreted any EKGs ordered by me or triage Sinus rhythm at 90 bpm left bundle-branch block, no acute ST elevation or depression ____________________________________________  RADIOLOGY  I reviewed any imaging ordered by me or triage that were performed during my shift and, if possible, patient and/or family made aware of any abnormal findings. ____________________________________________   PROCEDURES  Procedure(s) performed: None  Critical Care performed: None  ____________________________________________   INITIAL IMPRESSION / ASSESSMENT AND PLAN / ED COURSE  Pertinent labs & imaging results that were available during my care of the patient were reviewed by me and considered in my medical decision making (see chart for details).  Patient with nausea vomiting diarrhea and fever. Most likely viral. She also has a cough. Abdomen does not betray any focal abdominal pathology despite 3 days of symptoms, and at  this time we will defer CT scan. We will give her IV fluid, I'll check a chest x-ray because she has had a cough, we'll give her nausea medication, Tylenol for the fever, and we'll check diffuse blood work as a precaution and reassess. Patient nontoxic  ----------------------------------------- 7:43 PM on 03/25/2016 ----------------------------------------- Patient appears to have a pneumonia, her sats are good but she is febrile having diarrhea and vomiting. She feels dehydrated and weak. We will reassess now that she has gotten fluids.  ____________________________________________   FINAL CLINICAL IMPRESSION(S) / ED DIAGNOSES  Final diagnoses:  None      This chart was dictated using voice recognition software.  Despite best efforts to proofread,  errors can occur which can change meaning.     Jeanmarie PlantJames A Meilech Virts, MD 03/25/16 1806  Jeanmarie PlantJames A Luisdaniel Kenton, MD 03/25/16  1943 

## 2016-03-25 NOTE — H&P (Signed)
[facility] [place] [facility]   PATIENT NAME: Sara Hodges    MR#:  [id number]  DATE OF BIRTH:  Aug 23, 1951  DATE OF ADMISSION:  03/25/2016  PRIMARY CARE PHYSICIAN: Marguarite ArbourSPARKS,JEFFREY D, MD   REQUESTING/REFERRING PHYSICIAN: Alphonzo LemmingsMcShane, MD  CHIEF COMPLAINT:   Chief Complaint  Patient presents with  . Emesis    HISTORY OF PRESENT ILLNESS:  Sara Hodges  is a 65 y.o. female who presents with 4 days progressive symptoms. She began having malaise, chills, diarrhea, productive cough about 4 days ago. Symptoms are gotten worse over this time, and she decided to come to the ED today for evaluation. Here she was found to be febrile, tachycardic, and on chest x-ray showed have pneumonia. Hospitalists were called for admission.  PAST MEDICAL HISTORY:   Past Medical History  Diagnosis Date  . Pancreatic cyst   . Hypertension   . CHF (congestive heart failure) (HCC)   . Pancreatic mass   . HLD (hyperlipidemia)   . CAD (coronary artery disease)     PAST SURGICAL HISTORY:   Past Surgical History  Procedure Laterality Date  . Abdominal hysterectomy  1985  . Hand surgery    . Foot surgery  2016  . Incision and drainage Left 2010    breast   . Breast biopsy Right 2002    negative    SOCIAL HISTORY:   Social History  Substance Use Topics  . Smoking status: Never Smoker   . Smokeless tobacco: Never Used  . Alcohol Use: No    FAMILY HISTORY:   Family History  Problem Relation Age of Onset  . Lung cancer Mother     DRUG ALLERGIES:   Allergies  Allergen Reactions  . Amoxicillin-Pot Clavulanate Nausea Only and Other (See Comments)    Has patient had a PCN reaction causing immediate rash, facial/tongue/throat swelling, SOB or lightheadedness with hypotension: No Has patient had a PCN reaction causing severe rash involving mucus membranes or skin necrosis: No Has patient had a PCN reaction that required hospitalization No Has patient had a PCN  reaction occurring within the last 10 years: unknown If all of the above answers are "NO", then may proceed with Cephalosporin use.     MEDICATIONS AT HOME:   Prior to Admission medications   Medication Sig Start Date End Date Taking? Authorizing Provider  aspirin EC 81 MG tablet Take 81 mg by mouth daily.  01/25/15  Yes Historical Provider, MD  Vitamin D, Ergocalciferol, (DRISDOL) 50000 UNITS CAPS capsule Take by mouth.   Yes Historical Provider, MD  ENTRESTO 97-103 MG Take 1 tablet by mouth 2 (two) times daily. 03/24/16   Historical Provider, MD  folic acid (FOLVITE) 1 MG tablet Take 1 mg by mouth daily. 02/23/16   Historical Provider, MD  furosemide (LASIX) 20 MG tablet Take 20 mg by mouth as needed.  04/21/14 04/21/15  Historical Provider, MD  spironolactone (ALDACTONE) 25 MG tablet Take 25 mg by mouth daily.  03/20/15 03/19/16  Historical Provider, MD    REVIEW OF SYSTEMS:  Review of Systems  Constitutional: Positive for fever, chills and malaise/fatigue. Negative for weight loss.  HENT: Negative for ear pain, hearing loss and tinnitus.   Eyes: Negative for blurred vision, double vision, pain and redness.  Respiratory: Positive for cough and sputum production. Negative for hemoptysis and shortness of breath.   Cardiovascular: Negative for chest pain, palpitations, orthopnea and leg swelling.  Gastrointestinal: Positive for nausea and diarrhea. Negative for vomiting,  abdominal pain and constipation.  Genitourinary: Negative for dysuria, frequency and hematuria.  Musculoskeletal: Negative for back pain, joint pain and neck pain.  Skin:       No acne, rash, or lesions  Neurological: Negative for dizziness, tremors, focal weakness and weakness.  Endo/Heme/Allergies: Negative for polydipsia. Does not bruise/bleed easily.  Psychiatric/Behavioral: Negative for depression. The patient is not nervous/anxious and does not have insomnia.      VITAL SIGNS:   Filed Vitals:   03/25/16 1713 03/25/16  1916 03/25/16 2116  BP: 144/75 127/73 119/76  Pulse: 107 92 83  Temp: 101.4 F (38.6 C)    TempSrc: Oral    Resp: Height: 5' 6.5" (1.689 m)    Weight: 88.451 kg (195 lb)    SpO2: 98% 97% 96%   Wt Readings from Last 3 Encounters:  03/25/16 88.451 kg (195 lb)  04/26/15 88.451 kg (195 lb)  04/11/15 87.091 kg (192 lb)    PHYSICAL EXAMINATION:  Physical Exam  Vitals reviewed. Constitutional: She is oriented to person, place, and time. She appears well-developed and well-nourished. No distress.  HENT:  Head: Normocephalic and atraumatic.  Mouth/Throat: Oropharynx is clear and moist.  Eyes: Conjunctivae and EOM are normal. Pupils are equal, round, and reactive to light. No scleral icterus.  Neck: Normal range of motion. Neck supple. No JVD present. No thyromegaly present.  Cardiovascular: Normal rate, regular rhythm and intact distal pulses.  Exam reveals no gallop and no friction rub.   No murmur heard. Respiratory: Effort normal. No respiratory distress. She has no wheezes. She has no rales.  Course breath sounds  GI: Soft. Bowel sounds are normal. She exhibits no distension. There is tenderness.  Musculoskeletal: Normal range of motion. She exhibits no edema.  No arthritis, no gout  Lymphadenopathy:    She has no cervical adenopathy.  Neurological: She is alert and oriented to person, place, and time. No cranial nerve deficit.  No dysarthria, no aphasia  Skin: Skin is warm and dry. No rash noted. No erythema.  Psychiatric: She has a normal mood and affect. Her behavior is normal. Judgment and thought content normal.    LABORATORY PANEL:   CBC  Recent Labs Lab 03/25/16 1837  WBC 7.4  HGB 13.0  HCT 38.0  PLT 164   ------------------------------------------------------------------------------------------------------------------  Chemistries   Recent Labs Lab 03/25/16 1837  NA 135  K 3.8  CL 98*  CO2 24  GLUCOSE 118*  BUN 16  CREATININE 1.06*   CALCIUM 8.9  AST 54*  ALT 56*  ALKPHOS 71  BILITOT 1.2   ------------------------------------------------------------------------------------------------------------------  Cardiac Enzymes  Recent Labs Lab 03/25/16 1837  TROPONINI 0.06*   ------------------------------------------------------------------------------------------------------------------  RADIOLOGY:  Dg Chest 2 View  03/25/2016  CLINICAL DATA:  Cough. Nausea, vomiting and diarrhea for 3 days. Fever and chills. EXAM: CHEST  2 VIEW COMPARISON:  Report from radiographs 11/19/2015, images not available. FINDINGS: Heart is normal in size. Mild tortuosity atherosclerosis of the thoracic aorta. Ill-defined patchy opacity in the left midlung zone. Linear opacity at the left lung base and lingula. Right lung is clear. No pulmonary edema, pleural effusion or pneumothorax. No acute osseous abnormalities are seen. IMPRESSION: 1. Ill-defined patchy opacities in the left mid lung suspicious for pneumonia. Followup PA and lateral chest X-ray is recommended in 3-4 weeks following trial of antibiotic therapy to ensure resolution and exclude underlying malignancy. 2. Mild aortic atherosclerosis. Electronically Signed   By: Rubye Oaks M.D.   On:  03/25/2016 18:28    EKG:   Orders placed or performed during the hospital encounter of 03/25/16  . ED EKG  . ED EKG  . EKG 12-Lead  . EKG 12-Lead    IMPRESSION AND PLAN:  Principal Problem:   Sepsis (HCC) - hemodynamically stable, IV antibiotics started in the ED and continued on admission. We'll check a lactic acid. Cultures sent from the ED. Active Problems:   CAP (community acquired pneumonia) - antibiotics as above. When necessary duo nebs. When necessary antitussives. She had sputum culture.   Chronic combined systolic and diastolic CHF (congestive heart failure) (HCC) - continue home meds   HTN (hypertension) - continue home meds   HLD (hyperlipidemia) - continue home meds  All  the records are reviewed and case discussed with ED provider. Management plans discussed with the patient and/or family.  DVT PROPHYLAXIS: SubQ lovenox  GI PROPHYLAXIS: None  ADMISSION STATUS: Inpatient  CODE STATUS: Full Code Status History    This patient does not have a recorded code status. Please follow your organizational policy for patients in this situation.      TOTAL TIME TAKING CARE OF THIS PATIENT: 45 minutes.    Elmin Wiederholt FIELDING 03/25/2016, 9:26 PM  [facility]  Office  [phone]  CC: Primary care physician; Marguarite ArbourSPARKS,JEFFREY D, MD

## 2016-03-26 ENCOUNTER — Inpatient Hospital Stay
Admit: 2016-03-26 | Discharge: 2016-03-26 | Disposition: A | Discharge status: Home / Self Care | Payer: Medicare HMO | Attending: Internal Medicine | Admitting: Internal Medicine

## 2016-03-26 LAB — CBC
HEMATOCRIT: 35.7 % (ref 35.0–47.0)
HEMOGLOBIN: 12.5 g/dL (ref 12.0–16.0)
MCH: 33 pg (ref 26.0–34.0)
MCHC: 35 g/dL (ref 32.0–36.0)
MCV: 94.4 fL (ref 80.0–100.0)
PLATELETS: 163 10*3/uL (ref 150–440)
RBC: 3.78 MIL/uL — AB (ref 3.80–5.20)
RDW: 12.7 % (ref 11.5–14.5)
WBC: 6.1 10*3/uL (ref 3.6–11.0)

## 2016-03-26 LAB — BASIC METABOLIC PANEL
Anion gap: 9 (ref 5–15)
BUN: 16 mg/dL (ref 6–20)
CHLORIDE: 100 mmol/L — AB (ref 101–111)
CO2: 28 mmol/L (ref 22–32)
CREATININE: 1.19 mg/dL — AB (ref 0.44–1.00)
Calcium: 8.4 mg/dL — ABNORMAL LOW (ref 8.9–10.3)
GFR calc non Af Amer: 47 mL/min — ABNORMAL LOW (ref >60)
GFR, EST AFRICAN AMERICAN: 54 mL/min — AB (ref >60)
Glucose, Bld: 110 mg/dL — ABNORMAL HIGH (ref 65–99)
POTASSIUM: 3.5 mmol/L (ref 3.5–5.1)
SODIUM: 137 mmol/L (ref 135–145)

## 2016-03-26 LAB — TROPONIN I: Troponin I: <0.03 ng/mL (ref <0.03)

## 2016-03-26 LAB — LACTIC ACID, PLASMA: Lactic Acid, Venous: 1.7 mmol/L (ref 0.5–1.9)

## 2016-03-26 MED ORDER — FUROSEMIDE 20 MG PO TABS: 20.0000 mg | ORAL_TABLET | Freq: Every day | ORAL | Status: DC | PRN

## 2016-03-26 MED ORDER — VITAMIN D (ERGOCALCIFEROL) 1.25 MG (50000 UNIT) PO CAPS: 50000.0000 [IU] | ORAL_CAPSULE | ORAL | Status: DC

## 2016-03-26 MED ORDER — SPIRONOLACTONE 25 MG PO TABS
25.0000 mg | ORAL_TABLET | Freq: Every day | ORAL | Status: DC
  Administered 2016-03-26 – 2016-03-27 (×2): 25 mg via ORAL
  Filled 2016-03-26 (×3): qty 1

## 2016-03-26 MED ORDER — ASPIRIN EC 81 MG PO TBEC
81.0000 mg | DELAYED_RELEASE_TABLET | Freq: Every day | ORAL | Status: DC
  Administered 2016-03-26 – 2016-03-27 (×2): 81 mg via ORAL
  Filled 2016-03-26 (×3): qty 1

## 2016-03-26 MED ORDER — SACUBITRIL-VALSARTAN 97-103 MG PO TABS
1.0000 | ORAL_TABLET | Freq: Two times a day (BID) | ORAL | Status: DC
  Filled 2016-03-26: qty 1

## 2016-03-26 MED ORDER — SACUBITRIL-VALSARTAN 49-51 MG PO TABS
2.0000 | ORAL_TABLET | Freq: Two times a day (BID) | ORAL | Status: DC
Start: 2016-03-26 — End: 2016-03-27
  Administered 2016-03-26: 23:00:00 2 via ORAL
  Administered 2016-03-26: 1 via ORAL
  Administered 2016-03-27: 2 via ORAL
  Filled 2016-03-26 (×4): qty 2

## 2016-03-26 MED ORDER — FOLIC ACID 1 MG PO TABS
1.0000 mg | ORAL_TABLET | Freq: Every day | ORAL | Status: DC
  Administered 2016-03-26 – 2016-03-27 (×2): 1 mg via ORAL
  Filled 2016-03-26 (×3): qty 1

## 2016-03-26 NOTE — Progress Notes (Signed)
*  PRELIMINARY RESULTS* Echocardiogram 2D Echocardiogram has been performed.  Cristela BlueHege, Alann Avey 03/26/2016, 9:25 AM

## 2016-03-26 NOTE — Progress Notes (Signed)
Pt oriented to room, sputum instructions given.  Pt is independent/low falls risk.

## 2016-03-26 NOTE — Progress Notes (Signed)
[facility] - [place] at [facility]   PATIENT NAME: Sara Hodges    MR#:  [id number]  DATE OF BIRTH:  July 19, 1951  SUBJECTIVE:   Patient here with weakness and cough. She is also had diarrhea over the past few days   REVIEW OF SYSTEMS:    Review of Systems  Constitutional: Positive for fever and malaise/fatigue. Negative for chills.  HENT: Negative for ear discharge, ear pain, hearing loss, nosebleeds and sore throat.   Eyes: Negative for blurred vision and pain.  Respiratory: Positive for cough and shortness of breath. Negative for hemoptysis and wheezing.   Cardiovascular: Negative for chest pain, palpitations and leg swelling.  Gastrointestinal: Positive for diarrhea. Negative for nausea, vomiting, abdominal pain and blood in stool.  Genitourinary: Negative for dysuria.  Musculoskeletal: Negative for back pain.  Neurological: Positive for weakness. Negative for dizziness, tremors, speech change, focal weakness, seizures and headaches.  Endo/Heme/Allergies: Does not bruise/bleed easily.  Psychiatric/Behavioral: Negative for depression, suicidal ideas and hallucinations.    Tolerating Diet:yes      DRUG ALLERGIES:   Allergies  Allergen Reactions  . Amoxicillin-Pot Clavulanate Nausea Only and Other (See Comments)    Has patient had a PCN reaction causing immediate rash, facial/tongue/throat swelling, SOB or lightheadedness with hypotension: No Has patient had a PCN reaction causing severe rash involving mucus membranes or skin necrosis: No Has patient had a PCN reaction that required hospitalization No Has patient had a PCN reaction occurring within the last 10 years: unknown If all of the above answers are "NO", then may proceed with Cephalosporin use.     VITALS:  Blood pressure 120/55, pulse 80, temperature 101.2 F (38.4 C), temperature source Oral, resp. rate 18, height 5' 6.5" (1.689 m), weight 88.451 kg (195 lb), SpO2 100 %.  PHYSICAL  EXAMINATION:   Physical Exam  Constitutional: She is oriented to person, place, and time and well-developed, well-nourished, and in no distress. No distress.  HENT:  Head: Normocephalic.  Eyes: No scleral icterus.  Neck: Normal range of motion. Neck supple. No JVD present. No tracheal deviation present.  Cardiovascular: Normal rate, regular rhythm and normal heart sounds.  Exam reveals no gallop and no friction rub.   No murmur heard. Pulmonary/Chest: Effort normal and breath sounds normal. No respiratory distress. She has no wheezes. She has no rales. She exhibits no tenderness.  Abdominal: Soft. Bowel sounds are normal. She exhibits no distension and no mass. There is no tenderness. There is no rebound and no guarding.  Musculoskeletal: Normal range of motion. She exhibits no edema.  Neurological: She is alert and oriented to person, place, and time.  Skin: Skin is warm. No rash noted. No erythema.  Psychiatric: Affect and judgment normal.      LABORATORY PANEL:   CBC  Recent Labs Lab 03/26/16 0420  WBC 6.1  HGB 12.5  HCT 35.7  PLT 163   ------------------------------------------------------------------------------------------------------------------  Chemistries   Recent Labs Lab 03/25/16 1837 03/26/16 0420  NA 135 137  K 3.8 3.5  CL 98* 100*  CO2 24 28  GLUCOSE 118* 110*  BUN 16 16  CREATININE 1.06* 1.19*  CALCIUM 8.9 8.4*  AST 54*  --   ALT 56*  --   ALKPHOS 71  --   BILITOT 1.2  --    ------------------------------------------------------------------------------------------------------------------  Cardiac Enzymes  Recent Labs Lab 03/25/16 1837 03/25/16 2238 03/26/16 0420  TROPONINI 0.06* <0.03 <0.03   ------------------------------------------------------------------------------------------------------------------  RADIOLOGY:  Dg Chest 2 View  03/25/2016  CLINICAL DATA:  Cough. Nausea, vomiting and diarrhea for 3 days. Fever and chills. EXAM:  CHEST  2 VIEW COMPARISON:  Report from radiographs 11/19/2015, images not available. FINDINGS: Heart is normal in size. Mild tortuosity atherosclerosis of the thoracic aorta. Ill-defined patchy opacity in the left midlung zone. Linear opacity at the left lung base and lingula. Right lung is clear. No pulmonary edema, pleural effusion or pneumothorax. No acute osseous abnormalities are seen. IMPRESSION: 1. Ill-defined patchy opacities in the left mid lung suspicious for pneumonia. Followup PA and lateral chest X-ray is recommended in 3-4 weeks following trial of antibiotic therapy to ensure resolution and exclude underlying malignancy. 2. Mild aortic atherosclerosis. Electronically Signed   By: Rubye OaksMelanie  Ehinger M.D.   On: 03/25/2016 18:28     ASSESSMENT AND PLAN:    65 year old female with a history essential hypertension who presented with cough and fever and found to have community-acquired pneumonia.  1. Sepsis: Patient presents with tachycardia and fever due to left midlung pneumonia. Sepsis is improving. Continue Rocephin and azithromycin. Follow up on blood culture.  2. CAP: Antibiotics as above. She needs repeat CXR in 4 weeks.  3. Chronic combined systolic and diastolic heart failure: Echocardiogram was ordered by admitting physician which I will follow-up on. No signs of exacerbation at this time. Continue Entresto, Aldactone and Lasix.      Management plans discussed with the patient and she is in agreement.  CODE STATUS: full  TOTAL TIME TAKING CARE OF THIS PATIENT: 30 minutes.     POSSIBLE D/C 1-2 days, DEPENDING ON CLINICAL CONDITION.   Reyli Schroth M.D on 03/26/2016 at 11:19 AM  Between 7am to 6pm - Pager - [phone] After 6pm go to [email or web address] - password [employer] [email or web address]  [facility] [place]  Office  [phone]  CC: Primary care physician; Marguarite ArbourSPARKS,JEFFREY D, MD  Note: This dictation was prepared with Dragon dictation along with smaller phrase technology.  Any transcriptional errors that result from this process are unintentional.

## 2016-03-27 LAB — ECHOCARDIOGRAM COMPLETE
AVAREAVTI: 2.25 cm2
AVPG: 7 mmHg
AVPKVEL: 133 cm/s
Ao pk vel: 0.72 m/s
CHL CUP AV PEAK INDEX: 1.13
EERAT: 12.25
EWDT: 169 ms
FS: 30 % (ref 28–44)
Height: 66.5 in
IVS/LV PW RATIO, ED: 0.92
LA diam end sys: 37 mm
LA vol: 45.2 mL
LADIAMINDEX: 1.86 cm/m2
LASIZE: 37 mm
LAVOLA4C: 38.4 mL
LAVOLIN: 22.7 mL/m2
LV E/e' medial: 12.25
LV TDI E'MEDIAL: 6.31
LV e' LATERAL: 4.79 cm/s
LVEEAVG: 12.25
LVOT area: 3.14 cm2
LVOT diameter: 20 mm
LVOTPV: 95.1 cm/s
MV Dec: 169
MV pk A vel: 90.2 m/s
MVPKEVEL: 58.7 m/s
PW: 14.5 mm — AB (ref 0.6–1.1)
RV TAPSE: 12.6 mm
TDI e' lateral: 4.79
Weight: 3120 oz

## 2016-03-27 LAB — BASIC METABOLIC PANEL
ANION GAP: 7 (ref 5–15)
BUN: 13 mg/dL (ref 6–20)
CHLORIDE: 101 mmol/L (ref 101–111)
CO2: 27 mmol/L (ref 22–32)
Calcium: 8.3 mg/dL — ABNORMAL LOW (ref 8.9–10.3)
Creatinine, Ser: 1.04 mg/dL — ABNORMAL HIGH (ref 0.44–1.00)
GFR, EST NON AFRICAN AMERICAN: 55 mL/min — AB (ref >60)
Glucose, Bld: 105 mg/dL — ABNORMAL HIGH (ref 65–99)
POTASSIUM: 3.6 mmol/L (ref 3.5–5.1)
SODIUM: 135 mmol/L (ref 135–145)

## 2016-03-27 MED ORDER — LEVOFLOXACIN 750 MG PO TABS: 750.0000 mg | ORAL_TABLET | Freq: Every day | ORAL | Status: DC

## 2016-03-27 MED ORDER — BENZONATATE 200 MG PO CAPS: 200.0000 mg | ORAL_CAPSULE | Freq: Three times a day (TID) | ORAL | Status: DC | PRN

## 2016-03-27 NOTE — Care Management Important Message (Signed)
Important Message  Patient Details  Name: Sara Hodges MRN: [id number] Date of Birth: Apr 06, 1951   Medicare Important Message Given:  Yes    Olegario MessierKathy A Kirti Carl 03/27/2016, 10:29 AM

## 2016-03-27 NOTE — Discharge Summary (Signed)
[facility] - [place] at [facility]   PATIENT NAME: Howard Pouchnnie Hosman    MR#:  [id number]  DATE OF BIRTH:  Apr 16, 1951  DATE OF ADMISSION:  03/25/2016 ADMITTING PHYSICIAN: Oralia Manisavid Willis, MD  DATE OF DISCHARGE: 03/27/2016  PRIMARY CARE PHYSICIAN: SPARKS,JEFFREY D, MD    ADMISSION DIAGNOSIS:  Gastroenteritis [K52.9] Aspiration pneumonia, unspecified aspiration pneumonia type, unspecified laterality, unspecified part of lung (HCC) [J69.0]  DISCHARGE DIAGNOSIS:  Principal Problem:   Sepsis (HCC) Active Problems:   CAP (community acquired pneumonia)   Chronic combined systolic and diastolic CHF (congestive heart failure) (HCC)   HTN (hypertension)   HLD (hyperlipidemia)   SECONDARY DIAGNOSIS:   Past Medical History  Diagnosis Date  . Pancreatic cyst   . Hypertension   . CHF (congestive heart failure) (HCC)   . Pancreatic mass   . HLD (hyperlipidemia)   . CAD (coronary artery disease)     HOSPITAL COURSE:    65 year old female with a history essential hypertension who presented with cough and fever and found to have community-acquired pneumonia.  1. Sepsis: Patient presented with tachycardia and fever due to left midlung pneumonia. Sepsis has improved she was initially placed on Rocephin and azithromycin.  She will be discharged on Levaquin. 2. CAP: Antibiotics as above. She needs repeat CXR in 4 weeks.  3. Chronic combined systolic and diastolic heart failure: Echocardiogram shows normal ejection fraction without major valvular abnormalities.  Continue Entresto, Aldactone and Lasix.   DISCHARGE CONDITIONS AND DIET:   Stable for discharge on heart healthy diet  CONSULTS OBTAINED:     DRUG ALLERGIES:   Allergies  Allergen Reactions  . Amoxicillin-Pot Clavulanate Nausea Only and Other (See Comments)    Has patient had a PCN reaction causing immediate rash, facial/tongue/throat swelling, SOB or lightheadedness with hypotension: No Has patient had a  PCN reaction causing severe rash involving mucus membranes or skin necrosis: No Has patient had a PCN reaction that required hospitalization No Has patient had a PCN reaction occurring within the last 10 years: unknown If all of the above answers are "NO", then may proceed with Cephalosporin use.     DISCHARGE MEDICATIONS:   Current Discharge Medication List    START taking these medications   Details  benzonatate (TESSALON) 200 MG capsule Take 1 capsule (200 mg total) by mouth 3 (three) times daily as needed for cough. Qty: 20 capsule, Refills: 0    levofloxacin (LEVAQUIN) 750 MG tablet Take 1 tablet (750 mg total) by mouth daily. Qty: 5 tablet, Refills: 0      CONTINUE these medications which have NOT CHANGED   Details  aspirin EC 81 MG tablet Take 81 mg by mouth daily.     ENTRESTO 97-103 MG Take 1 tablet by mouth 2 (two) times daily.    folic acid (FOLVITE) 1 MG tablet Take 1 mg by mouth daily.    furosemide (LASIX) 20 MG tablet Take 20 mg by mouth daily as needed for fluid.    spironolactone (ALDACTONE) 25 MG tablet Take 25 mg by mouth daily.    Vitamin D, Ergocalciferol, (DRISDOL) 50000 UNITS CAPS capsule Take 50,000 Units by mouth every 7 (seven) days. Takes on Sunday              Today   CHIEF COMPLAINT:  Patient doing well this morning. She has a cough and mild headache but overall has improved.   VITAL SIGNS:  Blood pressure 111/62, pulse 72, temperature 99.3 F (37.4 C), temperature source  Oral, resp. rate 18, height 5' 6.5" (1.689 m), weight 88.451 kg (195 lb), SpO2 100 %.   REVIEW OF SYSTEMS:  Review of Systems  Constitutional: Negative for fever, chills and malaise/fatigue.  HENT: Negative for ear discharge, ear pain, hearing loss, nosebleeds and sore throat.   Eyes: Negative for blurred vision and pain.  Respiratory: Positive for cough. Negative for hemoptysis, shortness of breath and wheezing.   Cardiovascular: Negative for chest pain,  palpitations and leg swelling.  Gastrointestinal: Negative for nausea, vomiting, abdominal pain, diarrhea and blood in stool.  Genitourinary: Negative for dysuria.  Musculoskeletal: Negative for back pain.  Neurological: Positive for headaches. Negative for dizziness, tremors, speech change, focal weakness and seizures.  Endo/Heme/Allergies: Does not bruise/bleed easily.  Psychiatric/Behavioral: Negative for depression, suicidal ideas and hallucinations.     PHYSICAL EXAMINATION:  GENERAL:  65 y.o.-year-old patient lying in the bed with no acute distress.  NECK:  Supple, no jugular venous distention. No thyroid enlargement, no tenderness.  LUNGS: Normal breath sounds bilaterally, no wheezing, rales,rhonchi  No use of accessory muscles of respiration.  CARDIOVASCULAR: S1, S2 normal. No murmurs, rubs, or gallops.  ABDOMEN: Soft, non-tender, non-distended. Bowel sounds present. No organomegaly or mass.  EXTREMITIES: No pedal edema, cyanosis, or clubbing.  PSYCHIATRIC: The patient is alert and oriented x 3.  SKIN: No obvious rash, lesion, or ulcer.   DATA REVIEW:   CBC  Recent Labs Lab 03/26/16 0420  WBC 6.1  HGB 12.5  HCT 35.7  PLT 163    Chemistries   Recent Labs Lab 03/25/16 1837  03/27/16 0506  NA 135  < > 135  K 3.8  < > 3.6  CL 98*  < > 101  CO2 24  < > 27  GLUCOSE 118*  < > 105*  BUN 16  < > 13  CREATININE 1.06*  < > 1.04*  CALCIUM 8.9  < > 8.3*  AST 54*  --   --   ALT 56*  --   --   ALKPHOS 71  --   --   BILITOT 1.2  --   --   < > = values in this interval not displayed.  Cardiac Enzymes  Recent Labs Lab 03/25/16 1837 03/25/16 2238 03/26/16 0420  TROPONINI 0.06* <0.03 <0.03    Microbiology Results  @  RADIOLOGY:  Dg Chest 2 View  03/25/2016  CLINICAL DATA:  Cough. Nausea, vomiting and diarrhea for 3 days. Fever and chills. EXAM: CHEST  2 VIEW COMPARISON:  Report from radiographs 11/19/2015, images not available. FINDINGS: Heart is  normal in size. Mild tortuosity atherosclerosis of the thoracic aorta. Ill-defined patchy opacity in the left midlung zone. Linear opacity at the left lung base and lingula. Right lung is clear. No pulmonary edema, pleural effusion or pneumothorax. No acute osseous abnormalities are seen. IMPRESSION: 1. Ill-defined patchy opacities in the left mid lung suspicious for pneumonia. Followup PA and lateral chest X-ray is recommended in 3-4 weeks following trial of antibiotic therapy to ensure resolution and exclude underlying malignancy. 2. Mild aortic atherosclerosis. Electronically Signed   By: Rubye Oaks M.D.   On: 03/25/2016 18:28      Management plans discussed with the patient and She is in agreement. Stable for discharge home  Patient should follow up with PCP 1 week CODE STATUS:     Code Status Orders        Start     Ordered   03/25/16 2224  Full code  Continuous     03/25/16 2224    Code Status History    Date Active Date Inactive Code Status Order ID Comments User Context   This patient has a current code status but no historical code status.    Advance Directive Documentation        Most Recent Value   Type of Advance Directive  Living will   Pre-existing out of facility DNR order (yellow form or pink MOST form)     "MOST" Form in Place?        TOTAL TIME TAKING CARE OF THIS PATIENT: 35 minutes.    Note: This dictation was prepared with Dragon dictation along with smaller phrase technology. Any transcriptional errors that result from this process are unintentional.  Louanne Calvillo M.D on 03/27/2016 at 11:05 AM  Between 7am to 6pm - Pager - [phone] After 6pm go to [email or web address] - password [email or web address]  [facility]  Office  [phone]  CC: Primary care physician; Marguarite ArbourSPARKS,JEFFREY D, MD

## 2016-03-27 NOTE — Progress Notes (Signed)
Pt given d/c instructions r/t activity, diet, self care, weights daily, when to call MD and medications. Pt informed that 2 prescriptions were submitted to [facility], voiced understanding, pt d/c home via wheelchair escorted by staff and family

## 2016-03-30 LAB — CULTURE, BLOOD (ROUTINE X 2)
CULTURE: NO GROWTH
Culture: NO GROWTH

## 2016-04-22 ENCOUNTER — Encounter: Payer: Medicare HMO | Attending: Cardiovascular Disease | Admitting: Respiratory Therapy

## 2016-04-22 VITALS — Ht 64.5 in | Wt 198.0 lb

## 2016-04-22 DIAGNOSIS — E785 Hyperlipidemia, unspecified: Secondary | ICD-10-CM | POA: Insufficient documentation

## 2016-04-22 DIAGNOSIS — I2510 Atherosclerotic heart disease of native coronary artery without angina pectoris: Secondary | ICD-10-CM | POA: Diagnosis present

## 2016-04-22 DIAGNOSIS — Z7982 Long term (current) use of aspirin: Secondary | ICD-10-CM | POA: Diagnosis not present

## 2016-04-22 DIAGNOSIS — I10 Essential (primary) hypertension: Secondary | ICD-10-CM | POA: Insufficient documentation

## 2016-04-22 DIAGNOSIS — I5042 Chronic combined systolic (congestive) and diastolic (congestive) heart failure: Secondary | ICD-10-CM

## 2016-04-22 NOTE — Progress Notes (Signed)
Pulmonary Individual Treatment Plan  Patient Details  Name: Sara Hodges MRN: [id number] Date of Birth: 27-Jan-1951 Referring Provider:   Flowsheet Row Pulmonary Rehab from 04/22/2016 in [facility] Cardiac and Pulmonary Rehab  Referring Provider  Youlanda Mighty MD      Initial Encounter Date:  Flowsheet Row Pulmonary Rehab from 04/22/2016 in [facility] Cardiac and Pulmonary Rehab  Date  04/22/16  Referring Provider  Youlanda Mighty MD      Visit Diagnosis: Chronic combined systolic and diastolic congestive heart failure (HCC)  Patient's Home Medications on Admission:  Current Outpatient Prescriptions:    aspirin EC 81 MG tablet, Take 81 mg by mouth daily. , Disp: , Rfl:    benzonatate (TESSALON) 200 MG capsule, Take 1 capsule (200 mg total) by mouth 3 (three) times daily as needed for cough., Disp: 20 capsule, Rfl: 0   ENTRESTO 97-103 MG, Take 1 tablet by mouth 2 (two) times daily., Disp: , Rfl:    folic acid (FOLVITE) 1 MG tablet, Take 1 mg by mouth daily., Disp: , Rfl:    furosemide (LASIX) 20 MG tablet, Take 20 mg by mouth daily as needed for fluid., Disp: , Rfl:    levofloxacin (LEVAQUIN) 750 MG tablet, Take 1 tablet (750 mg total) by mouth daily., Disp: 5 tablet, Rfl: 0   spironolactone (ALDACTONE) 25 MG tablet, Take 25 mg by mouth daily., Disp: , Rfl:    Vitamin D, Ergocalciferol, (DRISDOL) 50000 UNITS CAPS capsule, Take 50,000 Units by mouth every 7 (seven) days. Takes on Sunday, Disp: , Rfl:   Past Medical History: Past Medical History:  Diagnosis Date   CAD (coronary artery disease)    CHF (congestive heart failure) (HCC)    HLD (hyperlipidemia)    Hypertension    Pancreatic cyst    Pancreatic mass     Tobacco Use: History  Smoking Status   Never Smoker  Smokeless Tobacco   Never Used    Labs: Recent Review Flowsheet Data    There is no flowsheet data to display.       ADL UCSD:     Pulmonary Assessment Scores    Row Name 04/22/16 1225          ADL UCSD   ADL Phase Entry     SOB Score total 13     Rest 0     Walk 1     Stairs 2     Bath 0     Dress 0     Shop 1        Pulmonary Function Assessment:     Pulmonary Function Assessment - 04/22/16 1224      Pulmonary Function Tests   FVC% 99 %   FEV1% 109 %   FEV1/FVC Ratio 86.1     Initial Spirometry Results   Comments Test date 04/23/2016     Breath   Bilateral Breath Sounds Clear   Shortness of Breath No      Exercise Target Goals: Date: 04/22/16  Exercise Program Goal: Individual exercise prescription set with THRR, safety & activity barriers. Participant demonstrates ability to understand and report RPE using BORG scale, to self-measure pulse accurately, and to acknowledge the importance of the exercise prescription.  Exercise Prescription Goal: Starting with aerobic activity 30 plus minutes a day, 3 days per week for initial exercise prescription. Provide home exercise prescription and guidelines that participant acknowledges understanding prior to discharge.  Activity Barriers & Risk Stratification:     Activity Barriers & Cardiac  Risk Stratification - 04/22/16 1222      Activity Barriers & Cardiac Risk Stratification   Activity Barriers Deconditioning;Arthritis   Cardiac Risk Stratification Low      6 Minute Walk:     6 Minute Walk    Row Name 04/22/16 1312         6 Minute Walk   Distance 1546 feet     Walk Time 6 minutes     # of Rest Breaks 0     MPH 2.93     METS 3.8     RPE 12     Perceived Dyspnea  2     VO2 Peak 13.3     Symptoms Yes (comment)     Comments Right knee pain 4/10     Resting HR 80 bpm     Resting BP 120/70     Max Ex. HR 148 bpm     Max Ex. BP 140/70       Interval HR   Baseline HR 80     3 Minute HR 148     6 Minute HR 91     2 Minute Post HR 82     Interval Heart Rate? Yes       Interval Oxygen   Interval Oxygen? Yes     Baseline Oxygen Saturation % 100 %     Baseline Liters of Oxygen 0 L   Room Air     1 Minute Liters of Oxygen 0 L     2 Minute Liters of Oxygen 0 L     3 Minute Oxygen Saturation % 94 %     3 Minute Liters of Oxygen 0 L     4 Minute Liters of Oxygen 0 L     5 Minute Liters of Oxygen 0 L     6 Minute Oxygen Saturation % 100 %     6 Minute Liters of Oxygen 0 L     2 Minute Post Oxygen Saturation % 100 %     2 Minute Post Liters of Oxygen 0 L        Initial Exercise Prescription:     Initial Exercise Prescription - 04/22/16 1300      Date of Initial Exercise RX and Referring Provider   Date 04/22/16   Referring Provider Youlanda Mighty MD     Treadmill   MPH 2.5   Grade 1   Minutes 15   METs 3.26     NuStep   Level 2   Minutes 15   METs 2     Recumbant Elliptical   Level 1   RPM 50   Minutes 15   METs 2     Prescription Details   Frequency (times per week) 3   Duration Progress to 45 minutes of aerobic exercise without signs/symptoms of physical distress     Intensity   THRR 40-80% of Max Heartrate 110-140   Ratings of Perceived Exertion 11-15   Perceived Dyspnea 0-4     Progression   Progression Continue to progress workloads to maintain intensity without signs/symptoms of physical distress.     Resistance Training   Training Prescription Yes   Weight 2 lbs   Reps 10-12      Perform Capillary Blood Glucose checks as needed.  Exercise Prescription Changes:   Exercise Comments:   Discharge Exercise Prescription (Final Exercise Prescription Changes):    Nutrition:  Target Goals: Understanding of nutrition guidelines, daily intake of sodium 1500mg ,  cholesterol 200mg , calories 30% from fat and 7% or less from saturated fats, daily to have 5 or more servings of fruits and vegetables.  Biometrics:     Pre Biometrics - 04/22/16 1322      Pre Biometrics   Height 5' 4.5" (1.638 m)   Weight 198 lb (89.8 kg)   Waist Circumference 38 inches   Hip Circumference 46 inches   Waist to Hip Ratio 0.83 %   BMI  (Calculated) 33.5       Nutrition Therapy Plan and Nutrition Goals:     Nutrition Therapy & Goals - 04/22/16 1231      Nutrition Therapy   Diet Sara Hodges may meet with the dietitian. She rarely drinks soda, watches her salt, and loves vegetables. She states that exercising will help her loss weight.      Nutrition Discharge: Rate Your Plate Scores:   Psychosocial: Target Goals: Acknowledge presence or absence of depression, maximize coping skills, provide positive support system. Participant is able to verbalize types and ability to use techniques and skills needed for reducing stress and depression.  Initial Review & Psychosocial Screening:     Initial Psych Review & Screening - 04/22/16 1241      Family Dynamics   Good Support System? Yes   Comments Sara Hodges states she does not have depression. She does care for her husband who does have alzheimer's, but has help from her daughter and friends. She is ready to feel better and be more active better and      Barriers   Psychosocial barriers to participate in program The patient should benefit from training in stress management and relaxation.     Screening Interventions   Interventions Encouraged to exercise;Program counselor consult      Quality of Life Scores:     Quality of Life - 04/22/16 1246      Quality of Life Scores   Health/Function Pre 20.6 %   Socioeconomic Pre 19.67 %   Psych/Spiritual Pre 20.71 %   Family Pre 21 %   GLOBAL Pre 20.52 %      PHQ-9: Recent Review Flowsheet Data    Depression screen [facility] 2/9 04/22/2016   Decreased Interest 0   Down, Depressed, Hopeless 0   PHQ - 2 Score 0   Altered sleeping 0   Tired, decreased energy 1   Change in appetite 1   Feeling bad or failure about yourself  0   Trouble concentrating 0   Moving slowly or fidgety/restless 0   Suicidal thoughts 0   PHQ-9 Score 2   Difficult doing work/chores Not difficult at all      Psychosocial Evaluation and  Intervention:   Psychosocial Re-Evaluation:  Education: Education Goals: Education classes will be provided on a weekly basis, covering required topics. Participant will state understanding/return demonstration of topics presented.  Learning Barriers/Preferences:     Learning Barriers/Preferences - 04/22/16 1223      Learning Barriers/Preferences   Learning Barriers None   Learning Preferences Group Instruction;Individual Instruction;Pictoral;Skilled Demonstration;Verbal Instruction;Video;Written Material      Education Topics: Initial Evaluation Education: - Verbal, written and demonstration of respiratory meds, RPE/PD scales, oximetry and breathing techniques. Instruction on use of nebulizers and MDIs: cleaning and proper use, rinsing mouth with steroid doses and importance of monitoring MDI activations. Flowsheet Row Pulmonary Rehab from 04/22/2016 in [facility] Cardiac and Pulmonary Rehab  Date  04/22/16  Educator  LB  Instruction Review Code  2- meets goals/outcomes  General Nutrition Guidelines/Fats and Fiber: -Group instruction provided by verbal, written material, models and posters to present the general guidelines for heart healthy nutrition. Gives an explanation and review of dietary fats and fiber.   Controlling Sodium/Reading Food Labels: -Group verbal and written material supporting the discussion of sodium use in heart healthy nutrition. Review and explanation with models, verbal and written materials for utilization of the food label.   Exercise Physiology & Risk Factors: - Group verbal and written instruction with models to review the exercise physiology of the cardiovascular system and associated critical values. Details cardiovascular disease risk factors and the goals associated with each risk factor.   Aerobic Exercise & Resistance Training: - Gives group verbal and written discussion on the health impact of inactivity. On the components of aerobic and  resistive training programs and the benefits of this training and how to safely progress through these programs.   Flexibility, Balance, General Exercise Guidelines: - Provides group verbal and written instruction on the benefits of flexibility and balance training programs. Provides general exercise guidelines with specific guidelines to those with heart or lung disease. Demonstration and skill practice provided.   Stress Management: - Provides group verbal and written instruction about the health risks of elevated stress, cause of high stress, and healthy ways to reduce stress.   Depression: - Provides group verbal and written instruction on the correlation between heart/lung disease and depressed mood, treatment options, and the stigmas associated with seeking treatment.   Exercise & Equipment Safety: - Individual verbal instruction and demonstration of equipment use and safety with use of the equipment.   Infection Prevention: - Provides verbal and written material to individual with discussion of infection control including proper hand washing and proper equipment cleaning during exercise session. Flowsheet Row Pulmonary Rehab from 04/22/2016 in [facility] Cardiac and Pulmonary Rehab  Date  04/22/16  Educator  LB  Instruction Review Code  2- meets goals/outcomes      Falls Prevention: - Provides verbal and written material to individual with discussion of falls prevention and safety. Flowsheet Row Pulmonary Rehab from 04/22/2016 in [facility] Cardiac and Pulmonary Rehab  Date  04/22/16  Educator  LB  Instruction Review Code  2- meets goals/outcomes      Diabetes: - Individual verbal and written instruction to review signs/symptoms of diabetes, desired ranges of glucose level fasting, after meals and with exercise. Advice that pre and post exercise glucose checks will be done for 3 sessions at entry of program.   Chronic Lung Diseases: - Group verbal and written instruction to review  new updates, new respiratory medications, new advancements in procedures and treatments. Provide informative websites and "800" numbers of self-education.   Lung Procedures: - Group verbal and written instruction to describe testing methods done to diagnose lung disease. Review the outcome of test results. Describe the treatment choices: Pulmonary Function Tests, ABGs and oximetry.   Energy Conservation: - Provide group verbal and written instruction for methods to conserve energy, plan and organize activities. Instruct on pacing techniques, use of adaptive equipment and posture/positioning to relieve shortness of breath.   Triggers: - Group verbal and written instruction to review types of environmental controls: home humidity, furnaces, filters, dust mite/pet prevention, HEPA vacuums. To discuss weather changes, air quality and the benefits of nasal washing.   Exacerbations: - Group verbal and written instruction to provide: warning signs, infection symptoms, calling MD promptly, preventive modes, and value of vaccinations. Review: effective airway clearance, coughing and/or vibration techniques. Create an  Action Plan.   Oxygen: - Individual and group verbal and written instruction on oxygen therapy. Includes supplement oxygen, available portable oxygen systems, continuous and intermittent flow rates, oxygen safety, concentrators, and Medicare reimbursement for oxygen.   Respiratory Medications: - Group verbal and written instruction to review medications for lung disease. Drug class, frequency, complications, importance of spacers, rinsing mouth after steroid MDI's, and proper cleaning methods for nebulizers.   AED/CPR: - Group verbal and written instruction with the use of models to demonstrate the basic use of the AED with the basic ABC's of resuscitation.   Breathing Retraining: - Provides individuals verbal and written instruction on purpose, frequency, and proper technique of  diaphragmatic breathing and pursed-lipped breathing. Applies individual practice skills.   Anatomy and Physiology of the Lungs: - Group verbal and written instruction with the use of models to provide basic lung anatomy and physiology related to function, structure and complications of lung disease.   Heart Failure: - Group verbal and written instruction on the basics of heart failure: signs/symptoms, treatments, explanation of ejection fraction, enlarged heart and cardiomyopathy.   Sleep Apnea: - Individual verbal and written instruction to review Obstructive Sleep Apnea. Review of risk factors, methods for diagnosing and types of masks and machines for OSA.   Anxiety: - Provides group, verbal and written instruction on the correlation between heart/lung disease and anxiety, treatment options, and management of anxiety.   Relaxation: - Provides group, verbal and written instruction about the benefits of relaxation for patients with heart/lung disease. Also provides patients with examples of relaxation techniques.   Knowledge Questionnaire Score:     Knowledge Questionnaire Score - 04/22/16 1223      Knowledge Questionnaire Score   Pre Score 7/10       Core Components/Risk Factors/Patient Goals at Admission:     Personal Goals and Risk Factors at Admission - 04/22/16 1236      Core Components/Risk Factors/Patient Goals on Admission    Weight Management Yes   Intervention Weight Management: Develop a combined nutrition and exercise program designed to reach desired caloric intake, while maintaining appropriate intake of nutrient and fiber, sodium and fats, and appropriate energy expenditure required for the weight goal.;Weight Management: Provide education and appropriate resources to help participant work on and attain dietary goals.   Admit Weight 198 lb (89.8 kg)   Goal Weight: Short Term 190 lb (86.2 kg)   Goal Weight: Long Term 158 lb (71.7 kg)   Expected Outcomes  Short Term: Continue to assess and modify interventions until short term weight is achieved;Long Term: Adherence to nutrition and physical activity/exercise program aimed toward attainment of established weight goal;Understanding recommendations for meals to include 15-35% energy as protein, 25-35% energy from fat, 35-60% energy from carbohydrates, less than 200mg  of dietary cholesterol, 20-35 gm of total fiber daily;Understanding of distribution of calorie intake throughout the day with the consumption of 4-5 meals/snacks   Sedentary Yes  Sara Hodges has joined the [employer] and plans continue exercise at their facility. She also has a treadmill at home.   Intervention Provide advice, education, support and counseling about physical activity/exercise needs.;Develop an individualized exercise prescription for aerobic and resistive training based on initial evaluation findings, risk stratification, comorbidities and participant's personal goals.   Expected Outcomes Achievement of increased cardiorespiratory fitness and enhanced flexibility, muscular endurance and strength shown through measurements of functional capacity and personal statement of participant.   Increase Strength and Stamina Yes   Intervention Provide advice, education, support and counseling  about physical activity/exercise needs.;Develop an individualized exercise prescription for aerobic and resistive training based on initial evaluation findings, risk stratification, comorbidities and participant's personal goals.   Expected Outcomes Achievement of increased cardiorespiratory fitness and enhanced flexibility, muscular endurance and strength shown through measurements of functional capacity and personal statement of participant.   Improve shortness of breath with ADL's Yes   Intervention Provide education, individualized exercise plan and daily activity instruction to help decrease symptoms of SOB with activities of daily living.   Expected  Outcomes Short Term: Achieves a reduction of symptoms when performing activities of daily living.   Develop more efficient breathing techniques such as purse lipped breathing and diaphragmatic breathing; and practicing self-pacing with activity Yes   Intervention Provide education, demonstration and support about specific breathing techniuqes utilized for more efficient breathing. Include techniques such as pursed lipped breathing, diaphragmatic breathing and self-pacing activity.   Expected Outcomes Short Term: Participant will be able to demonstrate and use breathing techniques as needed throughout daily activities.   Heart Failure Yes   Intervention Provide a combined exercise and nutrition program that is supplemented with education, support and counseling about heart failure. Directed toward relieving symptoms such as shortness of breath, decreased exercise tolerance, and extremity edema.   Expected Outcomes Improve functional capacity of life;Short term: Attendance in program 2-3 days a week with increased exercise capacity. Reported lower sodium intake. Reported increased fruit and vegetable intake. Reports medication compliance.;Short term: Daily weights obtained and reported for increase. Utilizing diuretic protocols set by physician.;Long term: Adoption of self-care skills and reduction of barriers for early signs and symptoms recognition and intervention leading to self-care maintenance.      Core Components/Risk Factors/Patient Goals Review:    Core Components/Risk Factors/Patient Goals at Discharge (Final Review):    ITP Comments:   Comments: Sara Hodges plans to start [facility] on 04/22/2016 and attend 2 days/week. She does have a co-pay with each session and will graduate early. 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

## 2016-04-22 NOTE — Patient Instructions (Signed)
Patient Instructions  Patient Details  Name: Sara Hodges MRN: [id number] Date of Birth: 09/03/1951 Referring Provider:  Francesco Runner, MD  Below are the personal goals you chose as well as exercise and nutrition goals. Our goal is to help you keep on track towards obtaining and maintaining your goals. We will be discussing your progress on these goals with you throughout the program.  Initial Exercise Prescription:     Initial Exercise Prescription - 04/22/16 1300      Date of Initial Exercise RX and Referring Provider   Date 04/22/16   Referring Provider Sara Mighty MD     Treadmill   MPH 2.5   Grade 1   Minutes 15   METs 3.26     NuStep   Level 2   Minutes 15   METs 2     Recumbant Elliptical   Level 1   RPM 50   Minutes 15   METs 2     Prescription Details   Frequency (times per week) 3   Duration Progress to 45 minutes of aerobic exercise without signs/symptoms of physical distress     Intensity   THRR 40-80% of Max Heartrate 110-140   Ratings of Perceived Exertion 11-15   Perceived Dyspnea 0-4     Progression   Progression Continue to progress workloads to maintain intensity without signs/symptoms of physical distress.     Resistance Training   Training Prescription Yes   Weight 2 lbs   Reps 10-12      Exercise Goals: Frequency: Be able to perform aerobic exercise three times per week working toward 3-5 days per week.  Intensity: Work with a perceived exertion of 11 (fairly light) - 15 (hard) as tolerated. Follow your new exercise prescription and watch for changes in prescription as you progress with the program. Changes will be reviewed with you when they are made.  Duration: You should be able to do 30 minutes of continuous aerobic exercise in addition to a 5 minute warm-up and a 5 minute cool-down routine.  Nutrition Goals: Your personal nutrition goals will be established when you do your nutrition analysis with the dietician.  The  following are nutrition guidelines to follow: Cholesterol < 200mg /day Sodium < 1500mg /day Fiber: Women over 50 yrs - 21 grams per day  Personal Goals:     Personal Goals and Risk Factors at Admission - 04/22/16 1236      Core Components/Risk Factors/Patient Goals on Admission    Weight Management Yes   Intervention Weight Management: Develop a combined nutrition and exercise program designed to reach desired caloric intake, while maintaining appropriate intake of nutrient and fiber, sodium and fats, and appropriate energy expenditure required for the weight goal.;Weight Management: Provide education and appropriate resources to help participant work on and attain dietary goals.   Admit Weight 198 lb (89.8 kg)   Goal Weight: Short Term 190 lb (86.2 kg)   Goal Weight: Long Term 158 lb (71.7 kg)   Expected Outcomes Short Term: Continue to assess and modify interventions until short term weight is achieved;Long Term: Adherence to nutrition and physical activity/exercise program aimed toward attainment of established weight goal;Understanding recommendations for meals to include 15-35% energy as protein, 25-35% energy from fat, 35-60% energy from carbohydrates, less than 200mg  of dietary cholesterol, 20-35 gm of total fiber daily;Understanding of distribution of calorie intake throughout the day with the consumption of 4-5 meals/snacks   Sedentary Yes  Sara Hodges has joined the [facility] and plans  continue exercise at their facility. She also has a treadmill at home.   Intervention Provide advice, education, support and counseling about physical activity/exercise needs.;Develop an individualized exercise prescription for aerobic and resistive training based on initial evaluation findings, risk stratification, comorbidities and participant's personal goals.   Expected Outcomes Achievement of increased cardiorespiratory fitness and enhanced flexibility, muscular endurance and strength shown through  measurements of functional capacity and personal statement of participant.   Increase Strength and Stamina Yes   Intervention Provide advice, education, support and counseling about physical activity/exercise needs.;Develop an individualized exercise prescription for aerobic and resistive training based on initial evaluation findings, risk stratification, comorbidities and participant's personal goals.   Expected Outcomes Achievement of increased cardiorespiratory fitness and enhanced flexibility, muscular endurance and strength shown through measurements of functional capacity and personal statement of participant.   Improve shortness of breath with ADL's Yes   Intervention Provide education, individualized exercise plan and daily activity instruction to help decrease symptoms of SOB with activities of daily living.   Expected Outcomes Short Term: Achieves a reduction of symptoms when performing activities of daily living.   Develop more efficient breathing techniques such as purse lipped breathing and diaphragmatic breathing; and practicing self-pacing with activity Yes   Intervention Provide education, demonstration and support about specific breathing techniuqes utilized for more efficient breathing. Include techniques such as pursed lipped breathing, diaphragmatic breathing and self-pacing activity.   Expected Outcomes Short Term: Participant will be able to demonstrate and use breathing techniques as needed throughout daily activities.   Heart Failure Yes   Intervention Provide a combined exercise and nutrition program that is supplemented with education, support and counseling about heart failure. Directed toward relieving symptoms such as shortness of breath, decreased exercise tolerance, and extremity edema.   Expected Outcomes Improve functional capacity of life;Short term: Attendance in program 2-3 days a week with increased exercise capacity. Reported lower sodium intake. Reported increased  fruit and vegetable intake. Reports medication compliance.;Short term: Daily weights obtained and reported for increase. Utilizing diuretic protocols set by physician.;Long term: Adoption of self-care skills and reduction of barriers for early signs and symptoms recognition and intervention leading to self-care maintenance.      Tobacco Use Initial Evaluation: History  Smoking Status   Never Smoker  Smokeless Tobacco   Never Used    Copy of goals given to participant.

## 2016-04-28 ENCOUNTER — Encounter: Payer: Medicare HMO | Admitting: *Deleted

## 2016-04-28 DIAGNOSIS — I5042 Chronic combined systolic (congestive) and diastolic (congestive) heart failure: Secondary | ICD-10-CM

## 2016-04-28 DIAGNOSIS — E785 Hyperlipidemia, unspecified: Secondary | ICD-10-CM | POA: Diagnosis not present

## 2016-04-28 NOTE — Progress Notes (Signed)
Daily Session Note  Patient Details  Name: Sara Hodges MRN: [id number] Date of Birth: 1951/09/19 Referring Provider:   Flowsheet Row Pulmonary Rehab from 04/22/2016 in [facility]  Referring Provider  Cloretta Ned MD      Encounter Date: 04/28/2016  Check In:     Session Check In - 04/28/16 1410      Check-In   Staff Present Carson Myrtle, BS, RRT, Respiratory Bertis Ruddy, BS, ACSM CEP, Exercise Physiologist;Jessica Luan Pulling, MA, ACSM RCEP, Exercise Physiologist   Supervising physician immediately available to respond to emergencies [facility] immediately available ER MD   Physician(s) Burlene Arnt and Kinner   Medication changes reported     No   Fall or balance concerns reported    No   Warm-up and Cool-down Performed on first and last piece of equipment   Resistance Training Performed Yes   VAD Patient? No     Pain Assessment   Currently in Pain? No/denies   Multiple Pain Sites No           Exercise Prescription Changes - 04/28/16 1400      Response to Exercise   Blood Pressure (Admit) 110/64   Blood Pressure (Exercise) 128/60   Blood Pressure (Exit) 126/64   Heart Rate (Admit) 80 bpm   Heart Rate (Exercise) 112 bpm   Heart Rate (Exit) 81 bpm   Oxygen Saturation (Admit) 100 %   Oxygen Saturation (Exercise) 100 %   Oxygen Saturation (Exit) 100 %   Rating of Perceived Exertion (Exercise) 11   Perceived Dyspnea (Exercise) 2   Duration Progress to 30 minutes of continuous aerobic without signs/symptoms of physical distress   Intensity THRR unchanged  110-140     Progression   Progression Continue progressive overload as per policy without signs/symptoms or physical distress.   Average METs 2.4     Resistance Training   Training Prescription Yes   Weight 2   Reps 10-12     Treadmill   MPH 2.5   Grade 1   Minutes 15   METs 3.26     NuStep   Level 2   Minutes 15   METs 2     Recumbant Elliptical   Level 1   RPM 50   Minutes 15   METs 2      Goals Met:  Proper associated with RPD/PD & O2 Sat Exercise tolerated well Personal goals reviewed Queuing for purse lip breathing Strength training completed today  Goals Unmet:  Not Applicable  Comments: First full day of exercise!  Patient was oriented to gym and equipment including functions, settings, policies, and procedures.  Patient's individual exercise prescription and treatment plan were reviewed.  All starting workloads were established based on the results of the 6 minute walk test done at initial orientation visit.  The plan for exercise progression was also introduced and progression will be customized based on patient's performance and goals.    Dr. Emily Filbert is Medical Director for [place] and [facility].

## 2016-04-28 NOTE — Progress Notes (Signed)
Pulmonary Individual Treatment Plan  Patient Details  Name: Sara Hodges MRN: [id number] Date of Birth: 01-03-1951 Referring Provider:   Flowsheet Row Pulmonary Rehab from 04/22/2016 in [facility] Cardiac and Pulmonary Rehab  Referring Provider  Cloretta Ned MD      Initial Encounter Date:  Flowsheet Row Pulmonary Rehab from 04/22/2016 in [facility] Cardiac and Pulmonary Rehab  Date  04/22/16  Referring Provider  Cloretta Ned MD      Visit Diagnosis: Chronic combined systolic and diastolic congestive heart failure ([place])  Patient's Home Medications on Admission:  Current Outpatient Prescriptions:  .  aspirin EC 81 MG tablet, Take 81 mg by mouth daily. , Disp: , Rfl:  .  benzonatate (TESSALON) 200 MG capsule, Take 1 capsule (200 mg total) by mouth 3 (three) times daily as needed for cough., Disp: 20 capsule, Rfl: 0 .  ENTRESTO 97-103 MG, Take 1 tablet by mouth 2 (two) times daily., Disp: , Rfl:  .  folic acid (FOLVITE) 1 MG tablet, Take 1 mg by mouth daily., Disp: , Rfl:  .  furosemide (LASIX) 20 MG tablet, Take 20 mg by mouth daily as needed for fluid., Disp: , Rfl:  .  levofloxacin (LEVAQUIN) 750 MG tablet, Take 1 tablet (750 mg total) by mouth daily., Disp: 5 tablet, Rfl: 0 .  spironolactone (ALDACTONE) 25 MG tablet, Take 25 mg by mouth daily., Disp: , Rfl:  .  Vitamin D, Ergocalciferol, (DRISDOL) 50000 UNITS CAPS capsule, Take 50,000 Units by mouth every 7 (seven) days. Takes on Sunday, Disp: , Rfl:   Past Medical History: Past Medical History:  Diagnosis Date  . CAD (coronary artery disease)   . CHF (congestive heart failure) ([place])   . HLD (hyperlipidemia)   . Hypertension   . Pancreatic cyst   . Pancreatic mass     Tobacco Use: History  Smoking Status  . Never Smoker  Smokeless Tobacco  . Never Used    Labs: Recent Review Flowsheet Data    There is no flowsheet data to display.       ADL UCSD:     Pulmonary Assessment Scores    Row Name 04/22/16 1225          ADL UCSD   ADL Phase Entry     SOB Score total 13     Rest 0     Walk 1     Stairs 2     Bath 0     Dress 0     Shop 1        Pulmonary Function Assessment:     Pulmonary Function Assessment - 04/22/16 1224      Pulmonary Function Tests   FVC% 99 %   FEV1% 109 %   FEV1/FVC Ratio 86.1     Initial Spirometry Results   Comments Test date 04/23/2016     Breath   Bilateral Breath Sounds Clear   Shortness of Breath No      Exercise Target Goals:    Exercise Program Goal: Individual exercise prescription set with THRR, safety & activity barriers. Participant demonstrates ability to understand and report RPE using BORG scale, to self-measure pulse accurately, and to acknowledge the importance of the exercise prescription.  Exercise Prescription Goal: Starting with aerobic activity 30 plus minutes a day, 3 days per week for initial exercise prescription. Provide home exercise prescription and guidelines that participant acknowledges understanding prior to discharge.  Activity Barriers & Risk Stratification:     Activity Barriers & Cardiac  Risk Stratification - 04/22/16 1222      Activity Barriers & Cardiac Risk Stratification   Activity Barriers Deconditioning;Arthritis   Cardiac Risk Stratification Low      6 Minute Walk:     6 Minute Walk    Row Name 04/22/16 1312         6 Minute Walk   Distance 1546 feet     Walk Time 6 minutes     # of Rest Breaks 0     MPH 2.93     METS 3.8     RPE 12     Perceived Dyspnea  2     VO2 Peak 13.3     Symptoms Yes (comment)     Comments Right knee pain 4/10     Resting HR 80 bpm     Resting BP 120/70     Max Ex. HR 148 bpm     Max Ex. BP 140/70       Interval HR   Baseline HR 80     3 Minute HR 148     6 Minute HR 91     2 Minute Post HR 82     Interval Heart Rate? Yes       Interval Oxygen   Interval Oxygen? Yes     Baseline Oxygen Saturation % 100 %     Baseline Liters of Oxygen 0 L  Room Air     1  Minute Liters of Oxygen 0 L     2 Minute Liters of Oxygen 0 L     3 Minute Oxygen Saturation % 94 %     3 Minute Liters of Oxygen 0 L     4 Minute Liters of Oxygen 0 L     5 Minute Liters of Oxygen 0 L     6 Minute Oxygen Saturation % 100 %     6 Minute Liters of Oxygen 0 L     2 Minute Post Oxygen Saturation % 100 %     2 Minute Post Liters of Oxygen 0 L        Initial Exercise Prescription:     Initial Exercise Prescription - 04/22/16 1300      Date of Initial Exercise RX and Referring Provider   Date 04/22/16   Referring Provider Cloretta Ned MD     Treadmill   MPH 2.5   Grade 1   Minutes 15   METs 3.26     NuStep   Level 2   Minutes 15   METs 2     Recumbant Elliptical   Level 1   RPM 50   Minutes 15   METs 2     Prescription Details   Frequency (times per week) 3   Duration Progress to 45 minutes of aerobic exercise without signs/symptoms of physical distress     Intensity   THRR 40-80% of Max Heartrate 110-140   Ratings of Perceived Exertion 11-15   Perceived Dyspnea 0-4     Progression   Progression Continue to progress workloads to maintain intensity without signs/symptoms of physical distress.     Resistance Training   Training Prescription Yes   Weight 2 lbs   Reps 10-12      Perform Capillary Blood Glucose checks as needed.  Exercise Prescription Changes:     Exercise Prescription Changes    Row Name 04/28/16 1400             Response to Exercise  Blood Pressure (Admit) 110/64       Blood Pressure (Exercise) 128/60       Blood Pressure (Exit) 126/64       Heart Rate (Admit) 80 bpm       Heart Rate (Exercise) 112 bpm       Heart Rate (Exit) 81 bpm       Oxygen Saturation (Admit) 100 %       Oxygen Saturation (Exercise) 100 %       Oxygen Saturation (Exit) 100 %       Rating of Perceived Exertion (Exercise) 11       Perceived Dyspnea (Exercise) 2       Duration Progress to 30 minutes of continuous aerobic without  signs/symptoms of physical distress       Intensity THRR unchanged  110-140         Progression   Progression Continue progressive overload as per policy without signs/symptoms or physical distress.       Average METs 2.4         Resistance Training   Training Prescription Yes       Weight 2       Reps 10-12         Treadmill   MPH 2.5       Grade 1       Minutes 15       METs 3.26         NuStep   Level 2       Minutes 15       METs 2         Recumbant Elliptical   Level 1       RPM 50       Minutes 15       METs 2          Exercise Comments:     Exercise Comments    Row Name 04/28/16 1423           Exercise Comments First full day of exercise!  Patient was oriented to gym and equipment including functions, settings, policies, and procedures.  Patient's individual exercise prescription and treatment plan were reviewed.  All starting workloads were established based on the results of the 6 minute walk test done at initial orientation visit.  The plan for exercise progression was also introduced and progression will be customized based on patient's performance and goals          Discharge Exercise Prescription (Final Exercise Prescription Changes):     Exercise Prescription Changes - 04/28/16 1400      Response to Exercise   Blood Pressure (Admit) 110/64   Blood Pressure (Exercise) 128/60   Blood Pressure (Exit) 126/64   Heart Rate (Admit) 80 bpm   Heart Rate (Exercise) 112 bpm   Heart Rate (Exit) 81 bpm   Oxygen Saturation (Admit) 100 %   Oxygen Saturation (Exercise) 100 %   Oxygen Saturation (Exit) 100 %   Rating of Perceived Exertion (Exercise) 11   Perceived Dyspnea (Exercise) 2   Duration Progress to 30 minutes of continuous aerobic without signs/symptoms of physical distress   Intensity THRR unchanged  110-140     Progression   Progression Continue progressive overload as per policy without signs/symptoms or physical distress.   Average METs  2.4     Resistance Training   Training Prescription Yes   Weight 2   Reps 10-12     Treadmill   MPH 2.5  Grade 1   Minutes 15   METs 3.26     NuStep   Level 2   Minutes 15   METs 2     Recumbant Elliptical   Level 1   RPM 50   Minutes 15   METs 2       Nutrition:  Target Goals: Understanding of nutrition guidelines, daily intake of sodium 1500mg , cholesterol 200mg , calories 30% from fat and 7% or less from saturated fats, daily to have 5 or more servings of fruits and vegetables.  Biometrics:     Pre Biometrics - 04/22/16 1322      Pre Biometrics   Height 5' 4.5" (1.638 m)   Weight 198 lb (89.8 kg)   Waist Circumference 38 inches   Hip Circumference 46 inches   Waist to Hip Ratio 0.83 %   BMI (Calculated) 33.5       Nutrition Therapy Plan and Nutrition Goals:     Nutrition Therapy & Goals - 04/22/16 1231      Nutrition Therapy   Diet Ms Paris LoreWiley may meet with the dietitian. She rarely drinks soda, watches her salt, and loves vegetables. She states that exercising will help her loss weight.      Nutrition Discharge: Rate Your Plate Scores:   Psychosocial: Target Goals: Acknowledge presence or absence of depression, maximize coping skills, provide positive support system. Participant is able to verbalize types and ability to use techniques and skills needed for reducing stress and depression.  Initial Review & Psychosocial Screening:     Initial Psych Review & Screening - 04/22/16 1241      Family Dynamics   Good Support System? Yes   Comments Ms Paris LoreWiley states she does not have depression. She does care for her husband who does have alzheimer's, but has help from her daughter and friends. She is ready to feel better and be more active better and      Barriers   Psychosocial barriers to participate in program The patient should benefit from training in stress management and relaxation.     Screening Interventions   Interventions Encouraged to  exercise;Program counselor consult      Quality of Life Scores:     Quality of Life - 04/22/16 1246      Quality of Life Scores   Health/Function Pre 20.6 %   Socioeconomic Pre 19.67 %   Psych/Spiritual Pre 20.71 %   Family Pre 21 %   GLOBAL Pre 20.52 %      PHQ-9: Recent Review Flowsheet Data    [facility] 04/[facility]   Decreased Interest 0   Down, Depressed, Hopeless 0   PHQ - 2 Score 0   Altered sleeping 0   Tired, decreased energy 1   Change in appetite 1   Feeling bad or failure about yourself  0   Trouble concentrating 0   Moving slowly or fidgety/restless 0   Suicidal thoughts 0   PHQ-9 Score 2   Difficult doing work/chores Not difficult at all      Psychosocial Evaluation and Intervention:   Psychosocial Re-Evaluation:  Education: Education Goals: Education classes will be provided on a weekly basis, covering required topics. Participant will state understanding/return demonstration of topics presented.  Learning Barriers/Preferences:     Learning Barriers/Preferences - 04/22/16 1223      Learning Barriers/Preferences   Learning Barriers None   Learning Preferences Group Instruction;Individual Instruction;Pictoral;Skilled Demonstration;Verbal Instruction;Video;Written Material      Education Topics: Initial Evaluation  Education: - Verbal, written and demonstration of respiratory meds, RPE/PD scales, oximetry and breathing techniques. Instruction on use of nebulizers and MDIs: cleaning and proper use, rinsing mouth with steroid doses and importance of monitoring MDI activations. Flowsheet Row Pulmonary Rehab from 04/28/2016 in [facility] Cardiac and Pulmonary Rehab  Date  04/22/16  Educator  LB  Instruction Review Code  2- meets goals/outcomes      General Nutrition Guidelines/Fats and Fiber: -Group instruction provided by verbal, written material, models and posters to present the general guidelines for heart healthy nutrition. Gives an  explanation and review of dietary fats and fiber.   Controlling Sodium/Reading Food Labels: -Group verbal and written material supporting the discussion of sodium use in heart healthy nutrition. Review and explanation with models, verbal and written materials for utilization of the food label.   Exercise Physiology & Risk Factors: - Group verbal and written instruction with models to review the exercise physiology of the cardiovascular system and associated critical values. Details cardiovascular disease risk factors and the goals associated with each risk factor.   Aerobic Exercise & Resistance Training: - Gives group verbal and written discussion on the health impact of inactivity. On the components of aerobic and resistive training programs and the benefits of this training and how to safely progress through these programs.   Flexibility, Balance, General Exercise Guidelines: - Provides group verbal and written instruction on the benefits of flexibility and balance training programs. Provides general exercise guidelines with specific guidelines to those with heart or lung disease. Demonstration and skill practice provided.   Stress Management: - Provides group verbal and written instruction about the health risks of elevated stress, cause of high stress, and healthy ways to reduce stress.   Depression: - Provides group verbal and written instruction on the correlation between heart/lung disease and depressed mood, treatment options, and the stigmas associated with seeking treatment.   Exercise & Equipment Safety: - Individual verbal instruction and demonstration of equipment use and safety with use of the equipment. Flowsheet Row Pulmonary Rehab from 04/28/2016 in [facility] Cardiac and Pulmonary Rehab  Date  04/28/16  Educator  [facility]  Instruction Review Code  2- meets goals/outcomes      Infection Prevention: - Provides verbal and written material to individual with discussion of  infection control including proper hand washing and proper equipment cleaning during exercise session. Flowsheet Row Pulmonary Rehab from 04/28/2016 in [facility] Cardiac and Pulmonary Rehab  Date  04/28/16  Educator  [facility]  Instruction Review Code  2- meets goals/outcomes      Falls Prevention: - Provides verbal and written material to individual with discussion of falls prevention and safety. Flowsheet Row Pulmonary Rehab from 04/28/2016 in [facility] Cardiac and Pulmonary Rehab  Date  04/22/16  Educator  LB  Instruction Review Code  2- meets goals/outcomes      Diabetes: - Individual verbal and written instruction to review signs/symptoms of diabetes, desired ranges of glucose level fasting, after meals and with exercise. Advice that pre and post exercise glucose checks will be done for 3 sessions at entry of program.   Chronic Lung Diseases: - Group verbal and written instruction to review new updates, new respiratory medications, new advancements in procedures and treatments. Provide informative websites and "800" numbers of self-education.   Lung Procedures: - Group verbal and written instruction to describe testing methods done to diagnose lung disease. Review the outcome of test results. Describe the treatment choices: Pulmonary Function Tests, ABGs and oximetry.   Energy Conservation: -  Provide group verbal and written instruction for methods to conserve energy, plan and organize activities. Instruct on pacing techniques, use of adaptive equipment and posture/positioning to relieve shortness of breath.   Triggers: - Group verbal and written instruction to review types of environmental controls: home humidity, furnaces, filters, dust mite/pet prevention, HEPA vacuums. To discuss weather changes, air quality and the benefits of nasal washing.   Exacerbations: - Group verbal and written instruction to provide: warning signs, infection symptoms, calling MD promptly, preventive modes, and  value of vaccinations. Review: effective airway clearance, coughing and/or vibration techniques. Create an Sport and exercise psychologist.   Oxygen: - Individual and group verbal and written instruction on oxygen therapy. Includes supplement oxygen, available portable oxygen systems, continuous and intermittent flow rates, oxygen safety, concentrators, and Medicare reimbursement for oxygen.   Respiratory Medications: - Group verbal and written instruction to review medications for lung disease. Drug class, frequency, complications, importance of spacers, rinsing mouth after steroid MDI's, and proper cleaning methods for nebulizers.   AED/CPR: - Group verbal and written instruction with the use of models to demonstrate the basic use of the AED with the basic ABC's of resuscitation.   Breathing Retraining: - Provides individuals verbal and written instruction on purpose, frequency, and proper technique of diaphragmatic breathing and pursed-lipped breathing. Applies individual practice skills. Flowsheet Row Pulmonary Rehab from 04/28/2016 in [facility] Cardiac and Pulmonary Rehab  Date  04/28/16  Educator  [facility]  Instruction Review Code  2- meets goals/outcomes      Anatomy and Physiology of the Lungs: - Group verbal and written instruction with the use of models to provide basic lung anatomy and physiology related to function, structure and complications of lung disease.   Heart Failure: - Group verbal and written instruction on the basics of heart failure: signs/symptoms, treatments, explanation of ejection fraction, enlarged heart and cardiomyopathy.   Sleep Apnea: - Individual verbal and written instruction to review Obstructive Sleep Apnea. Review of risk factors, methods for diagnosing and types of masks and machines for OSA.   Anxiety: - Provides group, verbal and written instruction on the correlation between heart/lung disease and anxiety, treatment options, and management of  anxiety.   Relaxation: - Provides group, verbal and written instruction about the benefits of relaxation for patients with heart/lung disease. Also provides patients with examples of relaxation techniques.   Knowledge Questionnaire Score:     Knowledge Questionnaire Score - 04/22/16 1223      Knowledge Questionnaire Score   Pre Score 7/10       Core Components/Risk Factors/Patient Goals at Admission:     Personal Goals and Risk Factors at Admission - 04/22/16 1236      Core Components/Risk Factors/Patient Goals on Admission    Weight Management Yes   Intervention Weight Management: Develop a combined nutrition and exercise program designed to reach desired caloric intake, while maintaining appropriate intake of nutrient and fiber, sodium and fats, and appropriate energy expenditure required for the weight goal.;Weight Management: Provide education and appropriate resources to help participant work on and attain dietary goals.   Admit Weight 198 lb (89.8 kg)   Goal Weight: Short Term 190 lb (86.2 kg)   Goal Weight: Long Term 158 lb (71.7 kg)   Expected Outcomes Short Term: Continue to assess and modify interventions until short term weight is achieved;Long Term: Adherence to nutrition and physical activity/exercise program aimed toward attainment of established weight goal;Understanding recommendations for meals to include 15-35% energy as protein, 25-35% energy from fat, 35-60%  energy from carbohydrates, less than 200mg  of dietary cholesterol, 20-35 gm of total fiber daily;Understanding of distribution of calorie intake throughout the day with the consumption of 4-5 meals/snacks   Sedentary Yes  Ms Facemire has joined the [employer] and plans continue exercise at their facility. She also has a treadmill at home.   Intervention Provide advice, education, support and counseling about physical activity/exercise needs.;Develop an individualized exercise prescription for aerobic and resistive  training based on initial evaluation findings, risk stratification, comorbidities and participant's personal goals.   Expected Outcomes Achievement of increased cardiorespiratory fitness and enhanced flexibility, muscular endurance and strength shown through measurements of functional capacity and personal statement of participant.   Increase Strength and Stamina Yes   Intervention Provide advice, education, support and counseling about physical activity/exercise needs.;Develop an individualized exercise prescription for aerobic and resistive training based on initial evaluation findings, risk stratification, comorbidities and participant's personal goals.   Expected Outcomes Achievement of increased cardiorespiratory fitness and enhanced flexibility, muscular endurance and strength shown through measurements of functional capacity and personal statement of participant.   Improve shortness of breath with ADL's Yes   Intervention Provide education, individualized exercise plan and daily activity instruction to help decrease symptoms of SOB with activities of daily living.   Expected Outcomes Short Term: Achieves a reduction of symptoms when performing activities of daily living.   Develop more efficient breathing techniques such as purse lipped breathing and diaphragmatic breathing; and practicing self-pacing with activity Yes   Intervention Provide education, demonstration and support about specific breathing techniuqes utilized for more efficient breathing. Include techniques such as pursed lipped breathing, diaphragmatic breathing and self-pacing activity.   Expected Outcomes Short Term: Participant will be able to demonstrate and use breathing techniques as needed throughout daily activities.   Heart Failure Yes   Intervention Provide a combined exercise and nutrition program that is supplemented with education, support and counseling about heart failure. Directed toward relieving symptoms such as  shortness of breath, decreased exercise tolerance, and extremity edema.   Expected Outcomes Improve functional capacity of life;Short term: Attendance in program 2-3 days a week with increased exercise capacity. Reported lower sodium intake. Reported increased fruit and vegetable intake. Reports medication compliance.;Short term: Daily weights obtained and reported for increase. Utilizing diuretic protocols set by physician.;Long term: Adoption of self-care skills and reduction of barriers for early signs and symptoms recognition and intervention leading to self-care maintenance.      Core Components/Risk Factors/Patient Goals Review:      Goals and Risk Factor Review    Row Name 04/28/16 1419             Core Components/Risk Factors/Patient Goals Review   Personal Goals Review Develop more efficient breathing techniques such as purse lipped breathing and diaphragmatic breathing and practicing self-pacing with activity.       Review Discussed with patient pursed lip breathing techniques and how this can help control SOB during exercise and daily activities. Patient demonstrated understanding of this concept.       Expected Outcomes Patient will use pursed lip breathing during exercise class and daily activities to help control SOB and thus be able to increase strength and stamina.           Core Components/Risk Factors/Patient Goals at Discharge (Final Review):      Goals and Risk Factor Review - 04/28/16 1419      Core Components/Risk Factors/Patient Goals Review   Personal Goals Review Develop more efficient breathing techniques such as  purse lipped breathing and diaphragmatic breathing and practicing self-pacing with activity.   Review Discussed with patient pursed lip breathing techniques and how this can help control SOB during exercise and daily activities. Patient demonstrated understanding of this concept.   Expected Outcomes Patient will use pursed lip breathing during exercise  class and daily activities to help control SOB and thus be able to increase strength and stamina.       ITP Comments:   Comments: Patient's first day of class/30 day review

## 2016-04-30 DIAGNOSIS — E785 Hyperlipidemia, unspecified: Secondary | ICD-10-CM | POA: Diagnosis not present

## 2016-04-30 DIAGNOSIS — I5042 Chronic combined systolic (congestive) and diastolic (congestive) heart failure: Secondary | ICD-10-CM

## 2016-04-30 NOTE — Progress Notes (Signed)
Daily Session Note  Patient Details  Name: Sara Hodges MRN: [id number] Date of Birth: Dec 14, 1950 Referring Provider:   Flowsheet Row Pulmonary Rehab from 04/22/2016 in [facility]  Referring Provider  Cloretta Ned MD      Encounter Date: 04/30/2016  Check In:     Session Check In - 04/30/16 1252      Check-In   Location [facility]   Staff Present Heath Lark, RN, BSN, CCRP;Laureen Owens Shark, BS, RRT, Respiratory Dareen Piano, BA, ACSM CEP, Exercise Physiologist   Supervising physician immediately available to respond to emergencies [facility] immediately available ER MD   Physician(s) Marcelene Butte and Jimmye Norman   Medication changes reported     No   Fall or balance concerns reported    No   Warm-up and Cool-down Performed on first and last piece of equipment   Resistance Training Performed Yes   VAD Patient? No     Pain Assessment   Currently in Pain? No/denies         Goals Met:  Proper O2 sat Independence with exercise equipment Execise tolerated well Strength training completed   Goals Unmet:  Not Applicable  Comments: Pt able to follow exercise prescription today without complaint.  Will continue to monitor for progression.    Dr. Emily Filbert is Medical Director for [facility] and [facility].

## 2016-05-05 ENCOUNTER — Telehealth: Payer: Self-pay | Admitting: Respiratory Therapy

## 2016-05-05 ENCOUNTER — Encounter: Payer: Self-pay | Admitting: Respiratory Therapy

## 2016-05-05 DIAGNOSIS — I5042 Chronic combined systolic (congestive) and diastolic (congestive) heart failure: Secondary | ICD-10-CM

## 2016-05-12 ENCOUNTER — Encounter: Payer: Self-pay | Admitting: Respiratory Therapy

## 2016-05-12 ENCOUNTER — Telehealth: Payer: Self-pay | Admitting: Respiratory Therapy

## 2016-05-12 DIAGNOSIS — I5042 Chronic combined systolic (congestive) and diastolic (congestive) heart failure: Secondary | ICD-10-CM

## 2016-05-21 ENCOUNTER — Encounter: Payer: Self-pay | Admitting: *Deleted

## 2016-05-21 DIAGNOSIS — I5042 Chronic combined systolic (congestive) and diastolic (congestive) heart failure: Secondary | ICD-10-CM

## 2016-05-27 ENCOUNTER — Encounter: Payer: Self-pay | Admitting: *Deleted

## 2016-05-27 DIAGNOSIS — I5042 Chronic combined systolic (congestive) and diastolic (congestive) heart failure: Secondary | ICD-10-CM

## 2016-05-27 NOTE — Progress Notes (Signed)
Pulmonary Individual Treatment Plan  Patient Details  Name: OLANDA BOUGHNER MRN: [id number] Date of Birth: 66-14-1952 Referring Provider:   Flowsheet Row Pulmonary Rehab from 04/22/2016 in [facility] Cardiac and Pulmonary Rehab  Referring Provider  Cloretta Ned MD      Initial Encounter Date:  Flowsheet Row Pulmonary Rehab from 04/22/2016 in [facility] Cardiac and Pulmonary Rehab  Date  04/22/16  Referring Provider  Cloretta Ned MD      Visit Diagnosis: Chronic combined systolic and diastolic congestive heart failure ([place])  Patient's Home Medications on Admission:  Current Outpatient Prescriptions:  .  aspirin EC 81 MG tablet, Take 81 mg by mouth daily. , Disp: , Rfl:  .  benzonatate (TESSALON) 200 MG capsule, Take 1 capsule (200 mg total) by mouth 3 (three) times daily as needed for cough., Disp: 20 capsule, Rfl: 0 .  ENTRESTO 97-103 MG, Take 1 tablet by mouth 2 (two) times daily., Disp: , Rfl:  .  folic acid (FOLVITE) 1 MG tablet, Take 1 mg by mouth daily., Disp: , Rfl:  .  furosemide (LASIX) 20 MG tablet, Take 20 mg by mouth daily as needed for fluid., Disp: , Rfl:  .  levofloxacin (LEVAQUIN) 750 MG tablet, Take 1 tablet (750 mg total) by mouth daily., Disp: 5 tablet, Rfl: 0 .  spironolactone (ALDACTONE) 25 MG tablet, Take 25 mg by mouth daily., Disp: , Rfl:  .  Vitamin D, Ergocalciferol, (DRISDOL) 50000 UNITS CAPS capsule, Take 50,000 Units by mouth every 7 (seven) days. Takes on Sunday, Disp: , Rfl:   Past Medical History: Past Medical History:  Diagnosis Date  . CAD (coronary artery disease)   . CHF (congestive heart failure) ([place])   . HLD (hyperlipidemia)   . Hypertension   . Pancreatic cyst   . Pancreatic mass     Tobacco Use: History  Smoking Status  . Never Smoker  Smokeless Tobacco  . Never Used    Labs: Recent Review Flowsheet Data    There is no flowsheet data to display.       ADL UCSD:     Pulmonary Assessment Scores    Row Name 04/22/16 1225          ADL UCSD   ADL Phase Entry     SOB Score total 13     Rest 0     Walk 1     Stairs 2     Bath 0     Dress 0     Shop 1        Pulmonary Function Assessment:     Pulmonary Function Assessment - 04/22/16 1224      Pulmonary Function Tests   FVC% 99 %   FEV1% 109 %   FEV1/FVC Ratio 86.1     Initial Spirometry Results   Comments Test date 04/23/2016     Breath   Bilateral Breath Sounds Clear   Shortness of Breath No      Exercise Target Goals:    Exercise Program Goal: Individual exercise prescription set with THRR, safety & activity barriers. Participant demonstrates ability to understand and report RPE using BORG scale, to self-measure pulse accurately, and to acknowledge the importance of the exercise prescription.  Exercise Prescription Goal: Starting with aerobic activity 30 plus minutes a day, 3 days per week for initial exercise prescription. Provide home exercise prescription and guidelines that participant acknowledges understanding prior to discharge.  Activity Barriers & Risk Stratification:     Activity Barriers & Cardiac  Risk Stratification - 04/22/16 1222      Activity Barriers & Cardiac Risk Stratification   Activity Barriers Deconditioning;Arthritis   Cardiac Risk Stratification Low      6 Minute Walk:     6 Minute Walk    Row Name 04/22/16 1312         6 Minute Walk   Distance 1546 feet     Walk Time 6 minutes     # of Rest Breaks 0     MPH 2.93     METS 3.8     RPE 12     Perceived Dyspnea  2     VO2 Peak 13.3     Symptoms Yes (comment)     Comments Right knee pain 4/10     Resting HR 80 bpm     Resting BP 120/70     Max Ex. HR 148 bpm     Max Ex. BP 140/70       Interval HR   Baseline HR 80     3 Minute HR 148     6 Minute HR 91     2 Minute Post HR 82     Interval Heart Rate? Yes       Interval Oxygen   Interval Oxygen? Yes     Baseline Oxygen Saturation % 100 %     Baseline Liters of Oxygen 0 L  Room Air     1  Minute Liters of Oxygen 0 L     2 Minute Liters of Oxygen 0 L     3 Minute Oxygen Saturation % 94 %     3 Minute Liters of Oxygen 0 L     4 Minute Liters of Oxygen 0 L     5 Minute Liters of Oxygen 0 L     6 Minute Oxygen Saturation % 100 %     6 Minute Liters of Oxygen 0 L     2 Minute Post Oxygen Saturation % 100 %     2 Minute Post Liters of Oxygen 0 L        Initial Exercise Prescription:     Initial Exercise Prescription - 04/22/16 1300      Date of Initial Exercise RX and Referring Provider   Date 04/22/16   Referring Provider Cloretta Ned MD     Treadmill   MPH 2.5   Grade 1   Minutes 15   METs 3.26     NuStep   Level 2   Minutes 15   METs 2     Recumbant Elliptical   Level 1   RPM 50   Minutes 15   METs 2     Prescription Details   Frequency (times per week) 3   Duration Progress to 45 minutes of aerobic exercise without signs/symptoms of physical distress     Intensity   THRR 40-80% of Max Heartrate 110-140   Ratings of Perceived Exertion 11-15   Perceived Dyspnea 0-4     Progression   Progression Continue to progress workloads to maintain intensity without signs/symptoms of physical distress.     Resistance Training   Training Prescription Yes   Weight 2 lbs   Reps 10-12      Perform Capillary Blood Glucose checks as needed.  Exercise Prescription Changes:     Exercise Prescription Changes    Row Name 04/28/16 1400             Response to Exercise  Blood Pressure (Admit) 110/64       Blood Pressure (Exercise) 128/60       Blood Pressure (Exit) 126/64       Heart Rate (Admit) 80 bpm       Heart Rate (Exercise) 112 bpm       Heart Rate (Exit) 81 bpm       Oxygen Saturation (Admit) 100 %       Oxygen Saturation (Exercise) 100 %       Oxygen Saturation (Exit) 100 %       Rating of Perceived Exertion (Exercise) 11       Perceived Dyspnea (Exercise) 2       Duration Progress to 30 minutes of continuous aerobic without  signs/symptoms of physical distress       Intensity THRR unchanged  110-140         Progression   Progression Continue progressive overload as per policy without signs/symptoms or physical distress.       Average METs 2.4         Resistance Training   Training Prescription Yes       Weight 2       Reps 10-12         Treadmill   MPH 2.5       Grade 1       Minutes 15       METs 3.26         NuStep   Level 2       Minutes 15       METs 2         Recumbant Elliptical   Level 1       RPM 50       Minutes 15       METs 2          Exercise Comments:     Exercise Comments    Row Name 04/28/16 1423 05/21/16 1536         Exercise Comments First full day of exercise!  Patient was oriented to gym and equipment including functions, settings, policies, and procedures.  Patient's individual exercise prescription and treatment plan were reviewed.  All starting workloads were established based on the results of the 6 minute walk test done at initial orientation visit.  The plan for exercise progression was also introduced and progression will be customized based on patient's performance and goals Betsy has been out due to knee pain issues.  She has a follow up appointment with orthopedic on 9/1         Discharge Exercise Prescription (Final Exercise Prescription Changes):     Exercise Prescription Changes - 04/28/16 1400      Response to Exercise   Blood Pressure (Admit) 110/64   Blood Pressure (Exercise) 128/60   Blood Pressure (Exit) 126/64   Heart Rate (Admit) 80 bpm   Heart Rate (Exercise) 112 bpm   Heart Rate (Exit) 81 bpm   Oxygen Saturation (Admit) 100 %   Oxygen Saturation (Exercise) 100 %   Oxygen Saturation (Exit) 100 %   Rating of Perceived Exertion (Exercise) 11   Perceived Dyspnea (Exercise) 2   Duration Progress to 30 minutes of continuous aerobic without signs/symptoms of physical distress   Intensity THRR unchanged  110-140     Progression    Progression Continue progressive overload as per policy without signs/symptoms or physical distress.   Average METs 2.4     Resistance Training   Training Prescription  Yes   Weight 2   Reps 10-12     Treadmill   MPH 2.5   Grade 1   Minutes 15   METs 3.26     NuStep   Level 2   Minutes 15   METs 2     Recumbant Elliptical   Level 1   RPM 50   Minutes 15   METs 2       Nutrition:  Target Goals: Understanding of nutrition guidelines, daily intake of sodium '1500mg'$ , cholesterol '200mg'$ , calories 30% from fat and 7% or less from saturated fats, daily to have 5 or more servings of fruits and vegetables.  Biometrics:     Pre Biometrics - 04/22/16 1322      Pre Biometrics   Height 5' 4.5" (1.638 m)   Weight 198 lb (89.8 kg)   Waist Circumference 38 inches   Hip Circumference 46 inches   Waist to Hip Ratio 0.83 %   BMI (Calculated) 33.5       Nutrition Therapy Plan and Nutrition Goals:     Nutrition Therapy & Goals - 04/22/16 1231      Nutrition Therapy   Diet Ms Mandell may meet with the dietitian. She rarely drinks soda, watches her salt, and loves vegetables. She states that exercising will help her loss weight.      Nutrition Discharge: Rate Your Plate Scores:   Psychosocial: Target Goals: Acknowledge presence or absence of depression, maximize coping skills, provide positive support system. Participant is able to verbalize types and ability to use techniques and skills needed for reducing stress and depression.  Initial Review & Psychosocial Screening:     Initial Psych Review & Screening - 04/22/16 1241      Family Dynamics   Good Support System? Yes   Comments Ms Kabler states she does not have depression. She does care for her husband who does have alzheimer's, but has help from her daughter and friends. She is ready to feel better and be more active better and      Barriers   Psychosocial barriers to participate in program The patient should  benefit from training in stress management and relaxation.     Screening Interventions   Interventions Encouraged to exercise;Program counselor consult      Quality of Life Scores:     Quality of Life - 04/22/16 1246      Quality of Life Scores   Health/Function Pre 20.6 %   Socioeconomic Pre 19.67 %   Psych/Spiritual Pre 20.71 %   Family Pre 21 %   GLOBAL Pre 20.52 %      PHQ-9: Recent Review Flowsheet Data    Depression screen [facility] 2/9 04/22/2016   Decreased Interest 0   Down, Depressed, Hopeless 0   PHQ - 2 Score 0   Altered sleeping 0   Tired, decreased energy 1   Change in appetite 1   Feeling bad or failure about yourself  0   Trouble concentrating 0   Moving slowly or fidgety/restless 0   Suicidal thoughts 0   PHQ-9 Score 2   Difficult doing work/chores Not difficult at all      Psychosocial Evaluation and Intervention:     Psychosocial Evaluation - 04/30/16 1105      Psychosocial Evaluation & Interventions   Interventions Encouraged to exercise with the program and follow exercise prescription   Comments Counselor met with Ms. Featherston today for initial psychosocial evaluation.  She is a 65 year old  who was recently in the hospital for pneumonia and her Dr. recommended this program for rehabilitation.  She has a strong support system with daughters who lives close by; friends and active involvement in her local church community.  Ms. Dardis has been married for 98 years and her spouse has dementia so she cares for him primarily.  She states she sleeps "okay" with 5-7 hours per night typically.  She has a good appetite and denies a history or current symptoms of depression or anxiety.  She states she is typically in a positive mood and other than caring for her spouse, she has minimal stress in her life.  Ms. Champa has goals to strengthen her heart; lose some weight and just get healthier in general.  Counselor recommended she consider OTC Melatonin to help with getting  to sleep earlier, and will follow with Ms. Coddington throughout the course of this program.        Psychosocial Re-Evaluation:  Education: Education Goals: Education classes will be provided on a weekly basis, covering required topics. Participant will state understanding/return demonstration of topics presented.  Learning Barriers/Preferences:     Learning Barriers/Preferences - 04/22/16 1223      Learning Barriers/Preferences   Learning Barriers None   Learning Preferences Group Instruction;Individual Instruction;Pictoral;Skilled Demonstration;Verbal Instruction;Video;Written Material      Education Topics: Initial Evaluation Education: - Verbal, written and demonstration of respiratory meds, RPE/PD scales, oximetry and breathing techniques. Instruction on use of nebulizers and MDIs: cleaning and proper use, rinsing mouth with steroid doses and importance of monitoring MDI activations. Flowsheet Row Pulmonary Rehab from 04/30/2016 in [facility] Cardiac and Pulmonary Rehab  Date  04/22/16  Educator  LB  Instruction Review Code  2- meets goals/outcomes      General Nutrition Guidelines/Fats and Fiber: -Group instruction provided by verbal, written material, models and posters to present the general guidelines for heart healthy nutrition. Gives an explanation and review of dietary fats and fiber.   Controlling Sodium/Reading Food Labels: -Group verbal and written material supporting the discussion of sodium use in heart healthy nutrition. Review and explanation with models, verbal and written materials for utilization of the food label.   Exercise Physiology & Risk Factors: - Group verbal and written instruction with models to review the exercise physiology of the cardiovascular system and associated critical values. Details cardiovascular disease risk factors and the goals associated with each risk factor.   Aerobic Exercise & Resistance Training: - Gives group verbal and written  discussion on the health impact of inactivity. On the components of aerobic and resistive training programs and the benefits of this training and how to safely progress through these programs.   Flexibility, Balance, General Exercise Guidelines: - Provides group verbal and written instruction on the benefits of flexibility and balance training programs. Provides general exercise guidelines with specific guidelines to those with heart or lung disease. Demonstration and skill practice provided.   Stress Management: - Provides group verbal and written instruction about the health risks of elevated stress, cause of high stress, and healthy ways to reduce stress.   Depression: - Provides group verbal and written instruction on the correlation between heart/lung disease and depressed mood, treatment options, and the stigmas associated with seeking treatment.   Exercise & Equipment Safety: - Individual verbal instruction and demonstration of equipment use and safety with use of the equipment. Flowsheet Row Pulmonary Rehab from 04/30/2016 in [facility] Cardiac and Pulmonary Rehab  Date  04/28/16  Educator  [facility]  Instruction Review  Code  2- meets goals/outcomes      Infection Prevention: - Provides verbal and written material to individual with discussion of infection control including proper hand washing and proper equipment cleaning during exercise session. Flowsheet Row Pulmonary Rehab from 04/30/2016 in [facility] Cardiac and Pulmonary Rehab  Date  04/28/16  Educator  [facility]  Instruction Review Code  2- meets goals/outcomes      Falls Prevention: - Provides verbal and written material to individual with discussion of falls prevention and safety. Flowsheet Row Pulmonary Rehab from 04/30/2016 in [facility] Cardiac and Pulmonary Rehab  Date  04/22/16  Educator  LB  Instruction Review Code  2- meets goals/outcomes      Diabetes: - Individual verbal and written instruction to review signs/symptoms of diabetes,  desired ranges of glucose level fasting, after meals and with exercise. Advice that pre and post exercise glucose checks will be done for 3 sessions at entry of program.   Chronic Lung Diseases: - Group verbal and written instruction to review new updates, new respiratory medications, new advancements in procedures and treatments. Provide informative websites and "800" numbers of self-education.   Lung Procedures: - Group verbal and written instruction to describe testing methods done to diagnose lung disease. Review the outcome of test results. Describe the treatment choices: Pulmonary Function Tests, ABGs and oximetry.   Energy Conservation: - Provide group verbal and written instruction for methods to conserve energy, plan and organize activities. Instruct on pacing techniques, use of adaptive equipment and posture/positioning to relieve shortness of breath.   Triggers: - Group verbal and written instruction to review types of environmental controls: home humidity, furnaces, filters, dust mite/pet prevention, HEPA vacuums. To discuss weather changes, air quality and the benefits of nasal washing.   Exacerbations: - Group verbal and written instruction to provide: warning signs, infection symptoms, calling MD promptly, preventive modes, and value of vaccinations. Review: effective airway clearance, coughing and/or vibration techniques. Create an Sports administrator.   Oxygen: - Individual and group verbal and written instruction on oxygen therapy. Includes supplement oxygen, available portable oxygen systems, continuous and intermittent flow rates, oxygen safety, concentrators, and Medicare reimbursement for oxygen.   Respiratory Medications: - Group verbal and written instruction to review medications for lung disease. Drug class, frequency, complications, importance of spacers, rinsing mouth after steroid MDI's, and proper cleaning methods for nebulizers.   AED/CPR: - Group verbal and  written instruction with the use of models to demonstrate the basic use of the AED with the basic ABC's of resuscitation.   Breathing Retraining: - Provides individuals verbal and written instruction on purpose, frequency, and proper technique of diaphragmatic breathing and pursed-lipped breathing. Applies individual practice skills. Flowsheet Row Pulmonary Rehab from 04/30/2016 in [facility] Cardiac and Pulmonary Rehab  Date  04/28/16  Educator  [facility]  Instruction Review Code  2- meets goals/outcomes      Anatomy and Physiology of the Lungs: - Group verbal and written instruction with the use of models to provide basic lung anatomy and physiology related to function, structure and complications of lung disease.   Heart Failure: - Group verbal and written instruction on the basics of heart failure: signs/symptoms, treatments, explanation of ejection fraction, enlarged heart and cardiomyopathy.   Sleep Apnea: - Individual verbal and written instruction to review Obstructive Sleep Apnea. Review of risk factors, methods for diagnosing and types of masks and machines for OSA.   Anxiety: - Provides group, verbal and written instruction on the correlation between heart/lung disease and anxiety, treatment  options, and management of anxiety.   Relaxation: - Provides group, verbal and written instruction about the benefits of relaxation for patients with heart/lung disease. Also provides patients with examples of relaxation techniques. Flowsheet Row Pulmonary Rehab from 04/30/2016 in [facility] Cardiac and Pulmonary Rehab  Date  04/30/16  Educator  [facility]  Instruction Review Code  2- Meets goals/outcomes      Knowledge Questionnaire Score:     Knowledge Questionnaire Score - 04/22/16 1223      Knowledge Questionnaire Score   Pre Score 7/10       Core Components/Risk Factors/Patient Goals at Admission:     Personal Goals and Risk Factors at Admission - 04/22/16 1236      Core Components/Risk  Factors/Patient Goals on Admission    Weight Management Yes   Intervention Weight Management: Develop a combined nutrition and exercise program designed to reach desired caloric intake, while maintaining appropriate intake of nutrient and fiber, sodium and fats, and appropriate energy expenditure required for the weight goal.;Weight Management: Provide education and appropriate resources to help participant work on and attain dietary goals.   Admit Weight 198 lb (89.8 kg)   Goal Weight: Short Term 190 lb (86.2 kg)   Goal Weight: Long Term 158 lb (71.7 kg)   Expected Outcomes Short Term: Continue to assess and modify interventions until short term weight is achieved;Long Term: Adherence to nutrition and physical activity/exercise program aimed toward attainment of established weight goal;Understanding recommendations for meals to include 15-35% energy as protein, 25-35% energy from fat, 35-60% energy from carbohydrates, less than '200mg'$  of dietary cholesterol, 20-35 gm of total fiber daily;Understanding of distribution of calorie intake throughout the day with the consumption of 4-5 meals/snacks   Sedentary Yes  Ms Oscarson has joined the [facility] and plans continue exercise at their facility. She also has a treadmill at home.   Intervention Provide advice, education, support and counseling about physical activity/exercise needs.;Develop an individualized exercise prescription for aerobic and resistive training based on initial evaluation findings, risk stratification, comorbidities and participant's personal goals.   Expected Outcomes Achievement of increased cardiorespiratory fitness and enhanced flexibility, muscular endurance and strength shown through measurements of functional capacity and personal statement of participant.   Increase Strength and Stamina Yes   Intervention Provide advice, education, support and counseling about physical activity/exercise needs.;Develop an individualized exercise  prescription for aerobic and resistive training based on initial evaluation findings, risk stratification, comorbidities and participant's personal goals.   Expected Outcomes Achievement of increased cardiorespiratory fitness and enhanced flexibility, muscular endurance and strength shown through measurements of functional capacity and personal statement of participant.   Improve shortness of breath with ADL's Yes   Intervention Provide education, individualized exercise plan and daily activity instruction to help decrease symptoms of SOB with activities of daily living.   Expected Outcomes Short Term: Achieves a reduction of symptoms when performing activities of daily living.   Develop more efficient breathing techniques such as purse lipped breathing and diaphragmatic breathing; and practicing self-pacing with activity Yes   Intervention Provide education, demonstration and support about specific breathing techniuqes utilized for more efficient breathing. Include techniques such as pursed lipped breathing, diaphragmatic breathing and self-pacing activity.   Expected Outcomes Short Term: Participant will be able to demonstrate and use breathing techniques as needed throughout daily activities.   Heart Failure Yes   Intervention Provide a combined exercise and nutrition program that is supplemented with education, support and counseling about heart failure. Directed toward relieving symptoms such as  shortness of breath, decreased exercise tolerance, and extremity edema.   Expected Outcomes Improve functional capacity of life;Short term: Attendance in program 2-3 days a week with increased exercise capacity. Reported lower sodium intake. Reported increased fruit and vegetable intake. Reports medication compliance.;Short term: Daily weights obtained and reported for increase. Utilizing diuretic protocols set by physician.;Long term: Adoption of self-care skills and reduction of barriers for early signs and  symptoms recognition and intervention leading to self-care maintenance.      Core Components/Risk Factors/Patient Goals Review:      Goals and Risk Factor Review    Row Name 04/28/16 1419             Core Components/Risk Factors/Patient Goals Review   Personal Goals Review Develop more efficient breathing techniques such as purse lipped breathing and diaphragmatic breathing and practicing self-pacing with activity.       Review Discussed with patient pursed lip breathing techniques and how this can help control SOB during exercise and daily activities. Patient demonstrated understanding of this concept.       Expected Outcomes Patient will use pursed lip breathing during exercise class and daily activities to help control SOB and thus be able to increase strength and stamina.           Core Components/Risk Factors/Patient Goals at Discharge (Final Review):      Goals and Risk Factor Review - 04/28/16 1419      Core Components/Risk Factors/Patient Goals Review   Personal Goals Review Develop more efficient breathing techniques such as purse lipped breathing and diaphragmatic breathing and practicing self-pacing with activity.   Review Discussed with patient pursed lip breathing techniques and how this can help control SOB during exercise and daily activities. Patient demonstrated understanding of this concept.   Expected Outcomes Patient will use pursed lip breathing during exercise class and daily activities to help control SOB and thus be able to increase strength and stamina.       ITP Comments:     ITP Comments    Row Name 05/05/16 0824 05/12/16 1529         ITP Comments Ms Qualls will miss class today. Her right knee is still swollen, and she is waiting for an appointment with her orthopedic physician. Ms Krul called and informed [facility] she will wait until her orthopedic appointment to return to class. Her appointment is on 05/23/16         Comments: 30 Day  Review

## 2016-05-28 ENCOUNTER — Encounter: Payer: Medicare HMO | Attending: Cardiovascular Disease

## 2016-05-28 DIAGNOSIS — E785 Hyperlipidemia, unspecified: Secondary | ICD-10-CM | POA: Insufficient documentation

## 2016-05-28 DIAGNOSIS — I10 Essential (primary) hypertension: Secondary | ICD-10-CM | POA: Insufficient documentation

## 2016-05-28 DIAGNOSIS — Z7982 Long term (current) use of aspirin: Secondary | ICD-10-CM | POA: Insufficient documentation

## 2016-06-03 ENCOUNTER — Encounter: Payer: Self-pay | Admitting: *Deleted

## 2016-06-03 DIAGNOSIS — I5042 Chronic combined systolic (congestive) and diastolic (congestive) heart failure: Secondary | ICD-10-CM

## 2016-06-10 ENCOUNTER — Encounter: Payer: Self-pay | Admitting: *Deleted

## 2016-06-10 ENCOUNTER — Telehealth: Payer: Self-pay | Admitting: *Deleted

## 2016-06-10 DIAGNOSIS — I5042 Chronic combined systolic (congestive) and diastolic (congestive) heart failure: Secondary | ICD-10-CM

## 2016-06-10 NOTE — Telephone Encounter (Signed)
Called to check on status to return to program.  She saw an orthopedist and does not have any injury.  She will need to see a neurologist as they are thinking it could be nerve damage.  To try starting Neurontin for pain.  She would like to hold her spot and will keep up posted on her status.

## 2016-06-23 ENCOUNTER — Encounter: Payer: Self-pay | Admitting: Respiratory Therapy

## 2016-06-23 ENCOUNTER — Encounter: Payer: Medicare HMO | Attending: Cardiovascular Disease

## 2016-06-23 DIAGNOSIS — I10 Essential (primary) hypertension: Secondary | ICD-10-CM | POA: Insufficient documentation

## 2016-06-23 DIAGNOSIS — Z7982 Long term (current) use of aspirin: Secondary | ICD-10-CM | POA: Insufficient documentation

## 2016-06-23 DIAGNOSIS — I5042 Chronic combined systolic (congestive) and diastolic (congestive) heart failure: Secondary | ICD-10-CM

## 2016-06-23 DIAGNOSIS — E785 Hyperlipidemia, unspecified: Secondary | ICD-10-CM | POA: Insufficient documentation

## 2016-06-23 NOTE — Progress Notes (Signed)
Pulmonary Individual Treatment Plan  Patient Details  Name: Sara Hodges MRN: [id number] Date of Birth: May 06, 1951 Referring Provider:   Flowsheet Row Pulmonary Rehab from 04/22/2016 in [facility] Cardiac and Pulmonary Rehab  Referring Provider  Cloretta Ned MD      Initial Encounter Date:  Flowsheet Row Pulmonary Rehab from 04/22/2016 in [facility] Cardiac and Pulmonary Rehab  Date  04/22/16  Referring Provider  Cloretta Ned MD      Visit Diagnosis: Chronic combined systolic and diastolic congestive heart failure ([place])  Patient's Home Medications on Admission:  Current Outpatient Prescriptions:    aspirin EC 81 MG tablet, Take 81 mg by mouth daily. , Disp: , Rfl:    benzonatate (TESSALON) 200 MG capsule, Take 1 capsule (200 mg total) by mouth 3 (three) times daily as needed for cough., Disp: 20 capsule, Rfl: 0   ENTRESTO 97-103 MG, Take 1 tablet by mouth 2 (two) times daily., Disp: , Rfl:    folic acid (FOLVITE) 1 MG tablet, Take 1 mg by mouth daily., Disp: , Rfl:    furosemide (LASIX) 20 MG tablet, Take 20 mg by mouth daily as needed for fluid., Disp: , Rfl:    levofloxacin (LEVAQUIN) 750 MG tablet, Take 1 tablet (750 mg total) by mouth daily., Disp: 5 tablet, Rfl: 0   spironolactone (ALDACTONE) 25 MG tablet, Take 25 mg by mouth daily., Disp: , Rfl:    Vitamin D, Ergocalciferol, (DRISDOL) 50000 UNITS CAPS capsule, Take 50,000 Units by mouth every 7 (seven) days. Takes on Sunday, Disp: , Rfl:   Past Medical History: Past Medical History:  Diagnosis Date   CAD (coronary artery disease)    CHF (congestive heart failure) ([place])    HLD (hyperlipidemia)    Hypertension    Pancreatic cyst    Pancreatic mass     Tobacco Use: History  Smoking Status   Never Smoker  Smokeless Tobacco   Never Used    Labs: Recent Review Flowsheet Data    There is no flowsheet data to display.       ADL UCSD:     Pulmonary Assessment Scores    Row Name 04/22/16 1225          ADL UCSD   ADL Phase Entry     SOB Score total 13     Rest 0     Walk 1     Stairs 2     Bath 0     Dress 0     Shop 1        Pulmonary Function Assessment:     Pulmonary Function Assessment - 04/22/16 1224      Pulmonary Function Tests   FVC% 99 %   FEV1% 109 %   FEV1/FVC Ratio 86.1     Initial Spirometry Results   Comments Test date 04/23/2016     Breath   Bilateral Breath Sounds Clear   Shortness of Breath No      Exercise Target Goals:    Exercise Program Goal: Individual exercise prescription set with THRR, safety & activity barriers. Participant demonstrates ability to understand and report RPE using BORG scale, to self-measure pulse accurately, and to acknowledge the importance of the exercise prescription.  Exercise Prescription Goal: Starting with aerobic activity 30 plus minutes a day, 3 days per week for initial exercise prescription. Provide home exercise prescription and guidelines that participant acknowledges understanding prior to discharge.  Activity Barriers & Risk Stratification:     Activity Barriers & Cardiac  Risk Stratification - 04/22/16 1222      Activity Barriers & Cardiac Risk Stratification   Activity Barriers Deconditioning;Arthritis   Cardiac Risk Stratification Low      6 Minute Walk:     6 Minute Walk    Row Name 04/22/16 1312         6 Minute Walk   Distance 1546 feet     Walk Time 6 minutes     # of Rest Breaks 0     MPH 2.93     METS 3.8     RPE 12     Perceived Dyspnea  2     VO2 Peak 13.3     Symptoms Yes (comment)     Comments Right knee pain 4/10     Resting HR 80 bpm     Resting BP 120/70     Max Ex. HR 148 bpm     Max Ex. BP 140/70       Interval HR   Baseline HR 80     3 Minute HR 148     6 Minute HR 91     2 Minute Post HR 82     Interval Heart Rate? Yes       Interval Oxygen   Interval Oxygen? Yes     Baseline Oxygen Saturation % 100 %     Baseline Liters of Oxygen 0 L  Room Air     1  Minute Liters of Oxygen 0 L     2 Minute Liters of Oxygen 0 L     3 Minute Oxygen Saturation % 94 %     3 Minute Liters of Oxygen 0 L     4 Minute Liters of Oxygen 0 L     5 Minute Liters of Oxygen 0 L     6 Minute Oxygen Saturation % 100 %     6 Minute Liters of Oxygen 0 L     2 Minute Post Oxygen Saturation % 100 %     2 Minute Post Liters of Oxygen 0 L        Initial Exercise Prescription:     Initial Exercise Prescription - 04/22/16 1300      Date of Initial Exercise RX and Referring Provider   Date 04/22/16   Referring Provider Cloretta Ned MD     Treadmill   MPH 2.5   Grade 1   Minutes 15   METs 3.26     NuStep   Level 2   Minutes 15   METs 2     Recumbant Elliptical   Level 1   RPM 50   Minutes 15   METs 2     Prescription Details   Frequency (times per week) 3   Duration Progress to 45 minutes of aerobic exercise without signs/symptoms of physical distress     Intensity   THRR 40-80% of Max Heartrate 110-140   Ratings of Perceived Exertion 11-15   Perceived Dyspnea 0-4     Progression   Progression Continue to progress workloads to maintain intensity without signs/symptoms of physical distress.     Resistance Training   Training Prescription Yes   Weight 2 lbs   Reps 10-12      Perform Capillary Blood Glucose checks as needed.  Exercise Prescription Changes:     Exercise Prescription Changes    Row Name 04/28/16 1400             Response to Exercise  Blood Pressure (Admit) 110/64       Blood Pressure (Exercise) 128/60       Blood Pressure (Exit) 126/64       Heart Rate (Admit) 80 bpm       Heart Rate (Exercise) 112 bpm       Heart Rate (Exit) 81 bpm       Oxygen Saturation (Admit) 100 %       Oxygen Saturation (Exercise) 100 %       Oxygen Saturation (Exit) 100 %       Rating of Perceived Exertion (Exercise) 11       Perceived Dyspnea (Exercise) 2       Duration Progress to 30 minutes of continuous aerobic without  signs/symptoms of physical distress       Intensity THRR unchanged  110-140         Progression   Progression Continue progressive overload as per policy without signs/symptoms or physical distress.       Average METs 2.4         Resistance Training   Training Prescription Yes       Weight 2       Reps 10-12         Treadmill   MPH 2.5       Grade 1       Minutes 15       METs 3.26         NuStep   Level 2       Minutes 15       METs 2         Recumbant Elliptical   Level 1       RPM 50       Minutes 15       METs 2          Exercise Comments:     Exercise Comments    Row Name 04/28/16 1423 05/21/16 1536 06/03/16 1544       Exercise Comments First full day of exercise!  Patient was oriented to gym and equipment including functions, settings, policies, and procedures.  Patient's individual exercise prescription and treatment plan were reviewed.  All starting workloads were established based on the results of the 6 minute walk test done at initial orientation visit.  The plan for exercise progression was also introduced and progression will be customized based on patient's performance and goals Brycelynn has been out due to knee pain issues.  She has a follow up appointment with orthopedic on 9/1 Pt has not attended since last review.        Discharge Exercise Prescription (Final Exercise Prescription Changes):     Exercise Prescription Changes - 04/28/16 1400      Response to Exercise   Blood Pressure (Admit) 110/64   Blood Pressure (Exercise) 128/60   Blood Pressure (Exit) 126/64   Heart Rate (Admit) 80 bpm   Heart Rate (Exercise) 112 bpm   Heart Rate (Exit) 81 bpm   Oxygen Saturation (Admit) 100 %   Oxygen Saturation (Exercise) 100 %   Oxygen Saturation (Exit) 100 %   Rating of Perceived Exertion (Exercise) 11   Perceived Dyspnea (Exercise) 2   Duration Progress to 30 minutes of continuous aerobic without signs/symptoms of physical distress   Intensity THRR  unchanged  110-140     Progression   Progression Continue progressive overload as per policy without signs/symptoms or physical distress.   Average METs 2.4  Resistance Training   Training Prescription Yes   Weight 2   Reps 10-12     Treadmill   MPH 2.5   Grade 1   Minutes 15   METs 3.26     NuStep   Level 2   Minutes 15   METs 2     Recumbant Elliptical   Level 1   RPM 50   Minutes 15   METs 2       Nutrition:  Target Goals: Understanding of nutrition guidelines, daily intake of sodium <1551m, cholesterol <2076m calories 30% from fat and 7% or less from saturated fats, daily to have 5 or more servings of fruits and vegetables.  Biometrics:     Pre Biometrics - 04/22/16 1322      Pre Biometrics   Height 5' 4.5" (1.638 m)   Weight 198 lb (89.8 kg)   Waist Circumference 38 inches   Hip Circumference 46 inches   Waist to Hip Ratio 0.83 %   BMI (Calculated) 33.5       Nutrition Therapy Plan and Nutrition Goals:     Nutrition Therapy & Goals - 04/22/16 1231      Nutrition Therapy   Diet Ms WiCahnay meet with the dietitian. She rarely drinks soda, watches her salt, and loves vegetables. She states that exercising will help her loss weight.      Nutrition Discharge: Rate Your Plate Scores:   Psychosocial: Target Goals: Acknowledge presence or absence of depression, maximize coping skills, provide positive support system. Participant is able to verbalize types and ability to use techniques and skills needed for reducing stress and depression.  Initial Review & Psychosocial Screening:     Initial Psych Review & Screening - 04/22/16 1241      Family Dynamics   Good Support System? Yes   Comments Ms WiSteinhausertates she does not have depression. She does care for her husband who does have alzheimer's, but has help from her daughter and friends. She is ready to feel better and be more active better and      Barriers   Psychosocial barriers to  participate in program The patient should benefit from training in stress management and relaxation.     Screening Interventions   Interventions Encouraged to exercise;Program counselor consult      Quality of Life Scores:     Quality of Life - 04/22/16 1246      Quality of Life Scores   Health/Function Pre 20.6 %   Socioeconomic Pre 19.67 %   Psych/Spiritual Pre 20.71 %   Family Pre 21 %   GLOBAL Pre 20.52 %      PHQ-9: Recent Review Flowsheet Data    [facility] 04/22[facility]2016   Decreased Interest 0   Down, Depressed, Hopeless 0   PHQ - 2 Score 0   Altered sleeping 0   Tired, decreased energy 1   Change in appetite 1   Feeling bad or failure about yourself  0   Trouble concentrating 0   Moving slowly or fidgety/restless 0   Suicidal thoughts 0   PHQ-9 Score 2   Difficult doing work/chores Not difficult at all      Psychosocial Evaluation and Intervention:     Psychosocial Evaluation - 04/30/16 1105      Psychosocial Evaluation & Interventions   Interventions Encouraged to exercise with the program and follow exercise prescription   Comments Counselor met with Ms. Tipler today for initial psychosocial evaluation.  She is a 65 year old who was recently in the hospital for pneumonia and her Dr. recommended this program for rehabilitation.  She has a strong support system with daughters who lives close by; friends and active involvement in her local church community.  Ms. Bolanos has been married for 34 years and her spouse has dementia so she cares for him primarily.  She states she sleeps "okay" with 5-7 hours per night typically.  She has a good appetite and denies a history or current symptoms of depression or anxiety.  She states she is typically in a positive mood and other than caring for her spouse, she has minimal stress in her life.  Ms. Pho has goals to strengthen her heart; lose some weight and just get healthier in general.  Counselor recommended she  consider OTC Melatonin to help with getting to sleep earlier, and will follow with Ms. Peyton throughout the course of this program.        Psychosocial Re-Evaluation:  Education: Education Goals: Education classes will be provided on a weekly basis, covering required topics. Participant will state understanding/return demonstration of topics presented.  Learning Barriers/Preferences:     Learning Barriers/Preferences - 04/22/16 1223      Learning Barriers/Preferences   Learning Barriers None   Learning Preferences Group Instruction;Individual Instruction;Pictoral;Skilled Demonstration;Verbal Instruction;Video;Written Material      Education Topics: Initial Evaluation Education: - Verbal, written and demonstration of respiratory meds, RPE/PD scales, oximetry and breathing techniques. Instruction on use of nebulizers and MDIs: cleaning and proper use, rinsing mouth with steroid doses and importance of monitoring MDI activations. Flowsheet Row Pulmonary Rehab from 04/30/2016 in [facility] Cardiac and Pulmonary Rehab  Date  04/22/16  Educator  LB  Instruction Review Code  2- meets goals/outcomes      General Nutrition Guidelines/Fats and Fiber: -Group instruction provided by verbal, written material, models and posters to present the general guidelines for heart healthy nutrition. Gives an explanation and review of dietary fats and fiber.   Controlling Sodium/Reading Food Labels: -Group verbal and written material supporting the discussion of sodium use in heart healthy nutrition. Review and explanation with models, verbal and written materials for utilization of the food label.   Exercise Physiology & Risk Factors: - Group verbal and written instruction with models to review the exercise physiology of the cardiovascular system and associated critical values. Details cardiovascular disease risk factors and the goals associated with each risk factor.   Aerobic Exercise & Resistance  Training: - Gives group verbal and written discussion on the health impact of inactivity. On the components of aerobic and resistive training programs and the benefits of this training and how to safely progress through these programs.   Flexibility, Balance, General Exercise Guidelines: - Provides group verbal and written instruction on the benefits of flexibility and balance training programs. Provides general exercise guidelines with specific guidelines to those with heart or lung disease. Demonstration and skill practice provided.   Stress Management: - Provides group verbal and written instruction about the health risks of elevated stress, cause of high stress, and healthy ways to reduce stress.   Depression: - Provides group verbal and written instruction on the correlation between heart/lung disease and depressed mood, treatment options, and the stigmas associated with seeking treatment.   Exercise & Equipment Safety: - Individual verbal instruction and demonstration of equipment use and safety with use of the equipment. Flowsheet Row Pulmonary Rehab from 04/30/2016 in [facility] Cardiac and Pulmonary Rehab  Date  04/28/16  Educator  [facility]  Instruction Review Code  2- meets goals/outcomes      Infection Prevention: - Provides verbal and written material to individual with discussion of infection control including proper hand washing and proper equipment cleaning during exercise session. Flowsheet Row Pulmonary Rehab from 04/30/2016 in [facility] Cardiac and Pulmonary Rehab  Date  04/28/16  Educator  [facility]  Instruction Review Code  2- meets goals/outcomes      Falls Prevention: - Provides verbal and written material to individual with discussion of falls prevention and safety. Flowsheet Row Pulmonary Rehab from 04/30/2016 in [facility] Cardiac and Pulmonary Rehab  Date  04/22/16  Educator  LB  Instruction Review Code  2- meets goals/outcomes      Diabetes: - Individual verbal and written  instruction to review signs/symptoms of diabetes, desired ranges of glucose level fasting, after meals and with exercise. Advice that pre and post exercise glucose checks will be done for 3 sessions at entry of program.   Chronic Lung Diseases: - Group verbal and written instruction to review new updates, new respiratory medications, new advancements in procedures and treatments. Provide informative websites and "800" numbers of self-education.   Lung Procedures: - Group verbal and written instruction to describe testing methods done to diagnose lung disease. Review the outcome of test results. Describe the treatment choices: Pulmonary Function Tests, ABGs and oximetry.   Energy Conservation: - Provide group verbal and written instruction for methods to conserve energy, plan and organize activities. Instruct on pacing techniques, use of adaptive equipment and posture/positioning to relieve shortness of breath.   Triggers: - Group verbal and written instruction to review types of environmental controls: home humidity, furnaces, filters, dust mite/pet prevention, HEPA vacuums. To discuss weather changes, air quality and the benefits of nasal washing.   Exacerbations: - Group verbal and written instruction to provide: warning signs, infection symptoms, calling MD promptly, preventive modes, and value of vaccinations. Review: effective airway clearance, coughing and/or vibration techniques. Create an Sports administrator.   Oxygen: - Individual and group verbal and written instruction on oxygen therapy. Includes supplement oxygen, available portable oxygen systems, continuous and intermittent flow rates, oxygen safety, concentrators, and Medicare reimbursement for oxygen.   Respiratory Medications: - Group verbal and written instruction to review medications for lung disease. Drug class, frequency, complications, importance of spacers, rinsing mouth after steroid MDI's, and proper cleaning methods for  nebulizers.   AED/CPR: - Group verbal and written instruction with the use of models to demonstrate the basic use of the AED with the basic ABC's of resuscitation.   Breathing Retraining: - Provides individuals verbal and written instruction on purpose, frequency, and proper technique of diaphragmatic breathing and pursed-lipped breathing. Applies individual practice skills. Flowsheet Row Pulmonary Rehab from 04/30/2016 in [facility] Cardiac and Pulmonary Rehab  Date  04/28/16  Educator  [facility]  Instruction Review Code  2- meets goals/outcomes      Anatomy and Physiology of the Lungs: - Group verbal and written instruction with the use of models to provide basic lung anatomy and physiology related to function, structure and complications of lung disease.   Heart Failure: - Group verbal and written instruction on the basics of heart failure: signs/symptoms, treatments, explanation of ejection fraction, enlarged heart and cardiomyopathy.   Sleep Apnea: - Individual verbal and written instruction to review Obstructive Sleep Apnea. Review of risk factors, methods for diagnosing and types of masks and machines for OSA.   Anxiety: - Provides group, verbal and written instruction on the correlation  between heart/lung disease and anxiety, treatment options, and management of anxiety.   Relaxation: - Provides group, verbal and written instruction about the benefits of relaxation for patients with heart/lung disease. Also provides patients with examples of relaxation techniques. Flowsheet Row Pulmonary Rehab from 04/30/2016 in [facility] Cardiac and Pulmonary Rehab  Date  04/30/16  Educator  [facility]  Instruction Review Code  2- Meets goals/outcomes      Knowledge Questionnaire Score:     Knowledge Questionnaire Score - 04/22/16 1223      Knowledge Questionnaire Score   Pre Score 7/10       Core Components/Risk Factors/Patient Goals at Admission:     Personal Goals and Risk Factors at Admission -  04/22/16 1236      Core Components/Risk Factors/Patient Goals on Admission    Weight Management Yes   Intervention Weight Management: Develop a combined nutrition and exercise program designed to reach desired caloric intake, while maintaining appropriate intake of nutrient and fiber, sodium and fats, and appropriate energy expenditure required for the weight goal.;Weight Management: Provide education and appropriate resources to help participant work on and attain dietary goals.   Admit Weight 198 lb (89.8 kg)   Goal Weight: Short Term 190 lb (86.2 kg)   Goal Weight: Long Term 158 lb (71.7 kg)   Expected Outcomes Short Term: Continue to assess and modify interventions until short term weight is achieved;Long Term: Adherence to nutrition and physical activity/exercise program aimed toward attainment of established weight goal;Understanding recommendations for meals to include 15-35% energy as protein, 25-35% energy from fat, 35-60% energy from carbohydrates, less than 220m of dietary cholesterol, 20-35 gm of total fiber daily;Understanding of distribution of calorie intake throughout the day with the consumption of 4-5 meals/snacks   Sedentary Yes  [place] [facility] [place] [facility] [place] She also has a treadmill at home.   Intervention Provide advice, education, support and counseling about physical activity/exercise needs.;Develop an individualized exercise prescription for aerobic and resistive training based on initial evaluation findings, risk stratification, comorbidities and participant's personal goals.   Expected Outcomes Achievement of increased cardiorespiratory fitness and enhanced flexibility, muscular endurance and strength shown through measurements of functional capacity and personal statement of participant.   Increase Strength and Stamina Yes   Intervention Provide advice, education, support and counseling about physical activity/exercise  needs.;Develop an individualized exercise prescription for aerobic and resistive training based on initial evaluation findings, risk stratification, comorbidities and participant's personal goals.   Expected Outcomes Achievement of increased cardiorespiratory fitness and enhanced flexibility, muscular endurance and strength shown through measurements of functional capacity and personal statement of participant.   Improve shortness of breath with ADL's Yes   Intervention Provide education, individualized exercise plan and daily activity instruction to help decrease symptoms of SOB with activities of daily living.   Expected Outcomes Short Term: Achieves a reduction of symptoms when performing activities of daily living.   Develop more efficient breathing techniques such as purse lipped breathing and diaphragmatic breathing; and practicing self-pacing with activity Yes   Intervention Provide education, demonstration and support about specific breathing techniuqes utilized for more efficient breathing. Include techniques such as pursed lipped breathing, diaphragmatic breathing and self-pacing activity.   Expected Outcomes Short Term: Participant will be able to demonstrate and use breathing techniques as needed throughout daily activities.   Heart Failure Yes   Intervention Provide a combined exercise and nutrition program that is supplemented with education, support and counseling about heart failure.  Directed toward relieving symptoms such as shortness of breath, decreased exercise tolerance, and extremity edema.   Expected Outcomes Improve functional capacity of life;Short term: Attendance in program 2-3 days a week with increased exercise capacity. Reported lower sodium intake. Reported increased fruit and vegetable intake. Reports medication compliance.;Short term: Daily weights obtained and reported for increase. Utilizing diuretic protocols set by physician.;Long term: Adoption of self-care skills and  reduction of barriers for early signs and symptoms recognition and intervention leading to self-care maintenance.      Core Components/Risk Factors/Patient Goals Review:      Goals and Risk Factor Review    Row Name 04/28/16 1419             Core Components/Risk Factors/Patient Goals Review   Personal Goals Review Develop more efficient breathing techniques such as purse lipped breathing and diaphragmatic breathing and practicing self-pacing with activity.       Review Discussed with patient pursed lip breathing techniques and how this can help control SOB during exercise and daily activities. Patient demonstrated understanding of this concept.       Expected Outcomes Patient will use pursed lip breathing during exercise class and daily activities to help control SOB and thus be able to increase strength and stamina.           Core Components/Risk Factors/Patient Goals at Discharge (Final Review):      Goals and Risk Factor Review - 04/28/16 1419      Core Components/Risk Factors/Patient Goals Review   Personal Goals Review Develop more efficient breathing techniques such as purse lipped breathing and diaphragmatic breathing and practicing self-pacing with activity.   Review Discussed with patient pursed lip breathing techniques and how this can help control SOB during exercise and daily activities. Patient demonstrated understanding of this concept.   Expected Outcomes Patient will use pursed lip breathing during exercise class and daily activities to help control SOB and thus be able to increase strength and stamina.       ITP Comments:     ITP Comments    Row Name 05/05/16 0824 05/12/16 1529 06/10/16 1503       ITP Comments Ms Chamberlin will miss class today. Her right knee is still swollen, and she is waiting for an appointment with her orthopedic physician. Ms Arkin called and informed [facility] she will wait until her orthopedic appointment to return to class. Her  appointment is on 05/23/16 Called to check on status to return to program.  She saw an orthopedist and does not have any injury.  She will need to see a neurologist as they are thinking it could be nerve damage.  To try starting Neurontin for pain.  She would like to hold her spot and will keep up posted on her status.        Comments: 30 day note review

## 2016-07-08 ENCOUNTER — Encounter: Payer: Self-pay | Admitting: Respiratory Therapy

## 2016-07-08 ENCOUNTER — Telehealth: Payer: Self-pay | Admitting: Respiratory Therapy

## 2016-07-08 DIAGNOSIS — I5042 Chronic combined systolic (congestive) and diastolic (congestive) heart failure: Secondary | ICD-10-CM

## 2016-07-08 NOTE — Progress Notes (Signed)
Pulmonary Individual Treatment Plan  Patient Details  Name: Sara Hodges MRN: [id number] Date of Birth: May 06, 1951 Referring Provider:   Flowsheet Row Pulmonary Rehab from 04/22/2016 in [facility] Cardiac and Pulmonary Rehab  Referring Provider  Cloretta Ned MD      Initial Encounter Date:  Flowsheet Row Pulmonary Rehab from 04/22/2016 in [facility] Cardiac and Pulmonary Rehab  Date  04/22/16  Referring Provider  Cloretta Ned MD      Visit Diagnosis: Chronic combined systolic and diastolic congestive heart failure ([place])  Patient's Home Medications on Admission:  Current Outpatient Prescriptions:    aspirin EC 81 MG tablet, Take 81 mg by mouth daily. , Disp: , Rfl:    benzonatate (TESSALON) 200 MG capsule, Take 1 capsule (200 mg total) by mouth 3 (three) times daily as needed for cough., Disp: 20 capsule, Rfl: 0   ENTRESTO 97-103 MG, Take 1 tablet by mouth 2 (two) times daily., Disp: , Rfl:    folic acid (FOLVITE) 1 MG tablet, Take 1 mg by mouth daily., Disp: , Rfl:    furosemide (LASIX) 20 MG tablet, Take 20 mg by mouth daily as needed for fluid., Disp: , Rfl:    levofloxacin (LEVAQUIN) 750 MG tablet, Take 1 tablet (750 mg total) by mouth daily., Disp: 5 tablet, Rfl: 0   spironolactone (ALDACTONE) 25 MG tablet, Take 25 mg by mouth daily., Disp: , Rfl:    Vitamin D, Ergocalciferol, (DRISDOL) 50000 UNITS CAPS capsule, Take 50,000 Units by mouth every 7 (seven) days. Takes on Sunday, Disp: , Rfl:   Past Medical History: Past Medical History:  Diagnosis Date   CAD (coronary artery disease)    CHF (congestive heart failure) ([place])    HLD (hyperlipidemia)    Hypertension    Pancreatic cyst    Pancreatic mass     Tobacco Use: History  Smoking Status   Never Smoker  Smokeless Tobacco   Never Used    Labs: Recent Review Flowsheet Data    There is no flowsheet data to display.       ADL UCSD:     Pulmonary Assessment Scores    Row Name 04/22/16 1225          ADL UCSD   ADL Phase Entry     SOB Score total 13     Rest 0     Walk 1     Stairs 2     Bath 0     Dress 0     Shop 1        Pulmonary Function Assessment:     Pulmonary Function Assessment - 04/22/16 1224      Pulmonary Function Tests   FVC% 99 %   FEV1% 109 %   FEV1/FVC Ratio 86.1     Initial Spirometry Results   Comments Test date 04/23/2016     Breath   Bilateral Breath Sounds Clear   Shortness of Breath No      Exercise Target Goals:    Exercise Program Goal: Individual exercise prescription set with THRR, safety & activity barriers. Participant demonstrates ability to understand and report RPE using BORG scale, to self-measure pulse accurately, and to acknowledge the importance of the exercise prescription.  Exercise Prescription Goal: Starting with aerobic activity 30 plus minutes a day, 3 days per week for initial exercise prescription. Provide home exercise prescription and guidelines that participant acknowledges understanding prior to discharge.  Activity Barriers & Risk Stratification:     Activity Barriers & Cardiac  Risk Stratification - 04/22/16 1222      Activity Barriers & Cardiac Risk Stratification   Activity Barriers Deconditioning;Arthritis   Cardiac Risk Stratification Low      6 Minute Walk:     6 Minute Walk    Row Name 04/22/16 1312         6 Minute Walk   Distance 1546 feet     Walk Time 6 minutes     # of Rest Breaks 0     MPH 2.93     METS 3.8     RPE 12     Perceived Dyspnea  2     VO2 Peak 13.3     Symptoms Yes (comment)     Comments Right knee pain 4/10     Resting HR 80 bpm     Resting BP 120/70     Max Ex. HR 148 bpm     Max Ex. BP 140/70       Interval HR   Baseline HR 80     3 Minute HR 148     6 Minute HR 91     2 Minute Post HR 82     Interval Heart Rate? Yes       Interval Oxygen   Interval Oxygen? Yes     Baseline Oxygen Saturation % 100 %     Baseline Liters of Oxygen 0 L  Room Air     1  Minute Liters of Oxygen 0 L     2 Minute Liters of Oxygen 0 L     3 Minute Oxygen Saturation % 94 %     3 Minute Liters of Oxygen 0 L     4 Minute Liters of Oxygen 0 L     5 Minute Liters of Oxygen 0 L     6 Minute Oxygen Saturation % 100 %     6 Minute Liters of Oxygen 0 L     2 Minute Post Oxygen Saturation % 100 %     2 Minute Post Liters of Oxygen 0 L        Initial Exercise Prescription:     Initial Exercise Prescription - 04/22/16 1300      Date of Initial Exercise RX and Referring Provider   Date 04/22/16   Referring Provider Cloretta Ned MD     Treadmill   MPH 2.5   Grade 1   Minutes 15   METs 3.26     NuStep   Level 2   Minutes 15   METs 2     Recumbant Elliptical   Level 1   RPM 50   Minutes 15   METs 2     Prescription Details   Frequency (times per week) 3   Duration Progress to 45 minutes of aerobic exercise without signs/symptoms of physical distress     Intensity   THRR 40-80% of Max Heartrate 110-140   Ratings of Perceived Exertion 11-15   Perceived Dyspnea 0-4     Progression   Progression Continue to progress workloads to maintain intensity without signs/symptoms of physical distress.     Resistance Training   Training Prescription Yes   Weight 2 lbs   Reps 10-12      Perform Capillary Blood Glucose checks as needed.  Exercise Prescription Changes:     Exercise Prescription Changes    Row Name 04/28/16 1400             Response to Exercise  Blood Pressure (Admit) 110/64       Blood Pressure (Exercise) 128/60       Blood Pressure (Exit) 126/64       Heart Rate (Admit) 80 bpm       Heart Rate (Exercise) 112 bpm       Heart Rate (Exit) 81 bpm       Oxygen Saturation (Admit) 100 %       Oxygen Saturation (Exercise) 100 %       Oxygen Saturation (Exit) 100 %       Rating of Perceived Exertion (Exercise) 11       Perceived Dyspnea (Exercise) 2       Duration Progress to 30 minutes of continuous aerobic without  signs/symptoms of physical distress       Intensity THRR unchanged  110-140         Progression   Progression Continue progressive overload as per policy without signs/symptoms or physical distress.       Average METs 2.4         Resistance Training   Training Prescription Yes       Weight 2       Reps 10-12         Treadmill   MPH 2.5       Grade 1       Minutes 15       METs 3.26         NuStep   Level 2       Minutes 15       METs 2         Recumbant Elliptical   Level 1       RPM 50       Minutes 15       METs 2          Exercise Comments:     Exercise Comments    Row Name 04/28/16 1423 05/21/16 1536 06/03/16 1544       Exercise Comments First full day of exercise!  Patient was oriented to gym and equipment including functions, settings, policies, and procedures.  Patient's individual exercise prescription and treatment plan were reviewed.  All starting workloads were established based on the results of the 6 minute walk test done at initial orientation visit.  The plan for exercise progression was also introduced and progression will be customized based on patient's performance and goals Brycelynn has been out due to knee pain issues.  She has a follow up appointment with orthopedic on 9/1 Pt has not attended since last review.        Discharge Exercise Prescription (Final Exercise Prescription Changes):     Exercise Prescription Changes - 04/28/16 1400      Response to Exercise   Blood Pressure (Admit) 110/64   Blood Pressure (Exercise) 128/60   Blood Pressure (Exit) 126/64   Heart Rate (Admit) 80 bpm   Heart Rate (Exercise) 112 bpm   Heart Rate (Exit) 81 bpm   Oxygen Saturation (Admit) 100 %   Oxygen Saturation (Exercise) 100 %   Oxygen Saturation (Exit) 100 %   Rating of Perceived Exertion (Exercise) 11   Perceived Dyspnea (Exercise) 2   Duration Progress to 30 minutes of continuous aerobic without signs/symptoms of physical distress   Intensity THRR  unchanged  110-140     Progression   Progression Continue progressive overload as per policy without signs/symptoms or physical distress.   Average METs 2.4  Resistance Training   Training Prescription Yes   Weight 2   Reps 10-12     Treadmill   MPH 2.5   Grade 1   Minutes 15   METs 3.26     NuStep   Level 2   Minutes 15   METs 2     Recumbant Elliptical   Level 1   RPM 50   Minutes 15   METs 2       Nutrition:  Target Goals: Understanding of nutrition guidelines, daily intake of sodium <1551m, cholesterol <2076m calories 30% from fat and 7% or less from saturated fats, daily to have 5 or more servings of fruits and vegetables.  Biometrics:     Pre Biometrics - 04/22/16 1322      Pre Biometrics   Height 5' 4.5" (1.638 m)   Weight 198 lb (89.8 kg)   Waist Circumference 38 inches   Hip Circumference 46 inches   Waist to Hip Ratio 0.83 %   BMI (Calculated) 33.5       Nutrition Therapy Plan and Nutrition Goals:     Nutrition Therapy & Goals - 04/22/16 1231      Nutrition Therapy   Diet Ms WiCahnay meet with the dietitian. She rarely drinks soda, watches her salt, and loves vegetables. She states that exercising will help her loss weight.      Nutrition Discharge: Rate Your Plate Scores:   Psychosocial: Target Goals: Acknowledge presence or absence of depression, maximize coping skills, provide positive support system. Participant is able to verbalize types and ability to use techniques and skills needed for reducing stress and depression.  Initial Review & Psychosocial Screening:     Initial Psych Review & Screening - 04/22/16 1241      Family Dynamics   Good Support System? Yes   Comments Ms WiSteinhausertates she does not have depression. She does care for her husband who does have alzheimer's, but has help from her daughter and friends. She is ready to feel better and be more active better and      Barriers   Psychosocial barriers to  participate in program The patient should benefit from training in stress management and relaxation.     Screening Interventions   Interventions Encouraged to exercise;Program counselor consult      Quality of Life Scores:     Quality of Life - 04/22/16 1246      Quality of Life Scores   Health/Function Pre 20.6 %   Socioeconomic Pre 19.67 %   Psych/Spiritual Pre 20.71 %   Family Pre 21 %   GLOBAL Pre 20.52 %      PHQ-9: Recent Review Flowsheet Data    [facility] 04/[facility]2016   Decreased Interest 0   Down, Depressed, Hopeless 0   PHQ - 2 Score 0   Altered sleeping 0   Tired, decreased energy 1   Change in appetite 1   Feeling bad or failure about yourself  0   Trouble concentrating 0   Moving slowly or fidgety/restless 0   Suicidal thoughts 0   PHQ-9 Score 2   Difficult doing work/chores Not difficult at all      Psychosocial Evaluation and Intervention:     Psychosocial Evaluation - 04/30/16 1105      Psychosocial Evaluation & Interventions   Interventions Encouraged to exercise with the program and follow exercise prescription   Comments Counselor met with Ms. Tipler today for initial psychosocial evaluation.  She is a 65 year old who was recently in the hospital for pneumonia and her Dr. recommended this program for rehabilitation.  She has a strong support system with daughters who lives close by; friends and active involvement in her local church community.  Ms. Bolanos has been married for 34 years and her spouse has dementia so she cares for him primarily.  She states she sleeps "okay" with 5-7 hours per night typically.  She has a good appetite and denies a history or current symptoms of depression or anxiety.  She states she is typically in a positive mood and other than caring for her spouse, she has minimal stress in her life.  Ms. Pho has goals to strengthen her heart; lose some weight and just get healthier in general.  Counselor recommended she  consider OTC Melatonin to help with getting to sleep earlier, and will follow with Ms. Peyton throughout the course of this program.        Psychosocial Re-Evaluation:  Education: Education Goals: Education classes will be provided on a weekly basis, covering required topics. Participant will state understanding/return demonstration of topics presented.  Learning Barriers/Preferences:     Learning Barriers/Preferences - 04/22/16 1223      Learning Barriers/Preferences   Learning Barriers None   Learning Preferences Group Instruction;Individual Instruction;Pictoral;Skilled Demonstration;Verbal Instruction;Video;Written Material      Education Topics: Initial Evaluation Education: - Verbal, written and demonstration of respiratory meds, RPE/PD scales, oximetry and breathing techniques. Instruction on use of nebulizers and MDIs: cleaning and proper use, rinsing mouth with steroid doses and importance of monitoring MDI activations. Flowsheet Row Pulmonary Rehab from 04/30/2016 in [facility] Cardiac and Pulmonary Rehab  Date  04/22/16  Educator  LB  Instruction Review Code  2- meets goals/outcomes      General Nutrition Guidelines/Fats and Fiber: -Group instruction provided by verbal, written material, models and posters to present the general guidelines for heart healthy nutrition. Gives an explanation and review of dietary fats and fiber.   Controlling Sodium/Reading Food Labels: -Group verbal and written material supporting the discussion of sodium use in heart healthy nutrition. Review and explanation with models, verbal and written materials for utilization of the food label.   Exercise Physiology & Risk Factors: - Group verbal and written instruction with models to review the exercise physiology of the cardiovascular system and associated critical values. Details cardiovascular disease risk factors and the goals associated with each risk factor.   Aerobic Exercise & Resistance  Training: - Gives group verbal and written discussion on the health impact of inactivity. On the components of aerobic and resistive training programs and the benefits of this training and how to safely progress through these programs.   Flexibility, Balance, General Exercise Guidelines: - Provides group verbal and written instruction on the benefits of flexibility and balance training programs. Provides general exercise guidelines with specific guidelines to those with heart or lung disease. Demonstration and skill practice provided.   Stress Management: - Provides group verbal and written instruction about the health risks of elevated stress, cause of high stress, and healthy ways to reduce stress.   Depression: - Provides group verbal and written instruction on the correlation between heart/lung disease and depressed mood, treatment options, and the stigmas associated with seeking treatment.   Exercise & Equipment Safety: - Individual verbal instruction and demonstration of equipment use and safety with use of the equipment. Flowsheet Row Pulmonary Rehab from 04/30/2016 in [facility] Cardiac and Pulmonary Rehab  Date  04/28/16  Educator  [facility]  Instruction Review Code  2- meets goals/outcomes      Infection Prevention: - Provides verbal and written material to individual with discussion of infection control including proper hand washing and proper equipment cleaning during exercise session. Flowsheet Row Pulmonary Rehab from 04/30/2016 in [facility] Cardiac and Pulmonary Rehab  Date  04/28/16  Educator  [facility]  Instruction Review Code  2- meets goals/outcomes      Falls Prevention: - Provides verbal and written material to individual with discussion of falls prevention and safety. Flowsheet Row Pulmonary Rehab from 04/30/2016 in [facility] Cardiac and Pulmonary Rehab  Date  04/22/16  Educator  LB  Instruction Review Code  2- meets goals/outcomes      Diabetes: - Individual verbal and written  instruction to review signs/symptoms of diabetes, desired ranges of glucose level fasting, after meals and with exercise. Advice that pre and post exercise glucose checks will be done for 3 sessions at entry of program.   Chronic Lung Diseases: - Group verbal and written instruction to review new updates, new respiratory medications, new advancements in procedures and treatments. Provide informative websites and "800" numbers of self-education.   Lung Procedures: - Group verbal and written instruction to describe testing methods done to diagnose lung disease. Review the outcome of test results. Describe the treatment choices: Pulmonary Function Tests, ABGs and oximetry.   Energy Conservation: - Provide group verbal and written instruction for methods to conserve energy, plan and organize activities. Instruct on pacing techniques, use of adaptive equipment and posture/positioning to relieve shortness of breath.   Triggers: - Group verbal and written instruction to review types of environmental controls: home humidity, furnaces, filters, dust mite/pet prevention, HEPA vacuums. To discuss weather changes, air quality and the benefits of nasal washing.   Exacerbations: - Group verbal and written instruction to provide: warning signs, infection symptoms, calling MD promptly, preventive modes, and value of vaccinations. Review: effective airway clearance, coughing and/or vibration techniques. Create an Sports administrator.   Oxygen: - Individual and group verbal and written instruction on oxygen therapy. Includes supplement oxygen, available portable oxygen systems, continuous and intermittent flow rates, oxygen safety, concentrators, and Medicare reimbursement for oxygen.   Respiratory Medications: - Group verbal and written instruction to review medications for lung disease. Drug class, frequency, complications, importance of spacers, rinsing mouth after steroid MDI's, and proper cleaning methods for  nebulizers.   AED/CPR: - Group verbal and written instruction with the use of models to demonstrate the basic use of the AED with the basic ABC's of resuscitation.   Breathing Retraining: - Provides individuals verbal and written instruction on purpose, frequency, and proper technique of diaphragmatic breathing and pursed-lipped breathing. Applies individual practice skills. Flowsheet Row Pulmonary Rehab from 04/30/2016 in [facility] Cardiac and Pulmonary Rehab  Date  04/28/16  Educator  [facility]  Instruction Review Code  2- meets goals/outcomes      Anatomy and Physiology of the Lungs: - Group verbal and written instruction with the use of models to provide basic lung anatomy and physiology related to function, structure and complications of lung disease.   Heart Failure: - Group verbal and written instruction on the basics of heart failure: signs/symptoms, treatments, explanation of ejection fraction, enlarged heart and cardiomyopathy.   Sleep Apnea: - Individual verbal and written instruction to review Obstructive Sleep Apnea. Review of risk factors, methods for diagnosing and types of masks and machines for OSA.   Anxiety: - Provides group, verbal and written instruction on the correlation  between heart/lung disease and anxiety, treatment options, and management of anxiety.   Relaxation: - Provides group, verbal and written instruction about the benefits of relaxation for patients with heart/lung disease. Also provides patients with examples of relaxation techniques. Flowsheet Row Pulmonary Rehab from 04/30/2016 in [facility] Cardiac and Pulmonary Rehab  Date  04/30/16  Educator  [facility]  Instruction Review Code  2- Meets goals/outcomes      Knowledge Questionnaire Score:     Knowledge Questionnaire Score - 04/22/16 1223      Knowledge Questionnaire Score   Pre Score 7/10       Core Components/Risk Factors/Patient Goals at Admission:     Personal Goals and Risk Factors at Admission -  04/22/16 1236      Core Components/Risk Factors/Patient Goals on Admission    Weight Management Yes   Intervention Weight Management: Develop a combined nutrition and exercise program designed to reach desired caloric intake, while maintaining appropriate intake of nutrient and fiber, sodium and fats, and appropriate energy expenditure required for the weight goal.;Weight Management: Provide education and appropriate resources to help participant work on and attain dietary goals.   Admit Weight 198 lb (89.8 kg)   Goal Weight: Short Term 190 lb (86.2 kg)   Goal Weight: Long Term 158 lb (71.7 kg)   Expected Outcomes Short Term: Continue to assess and modify interventions until short term weight is achieved;Long Term: Adherence to nutrition and physical activity/exercise program aimed toward attainment of established weight goal;Understanding recommendations for meals to include 15-35% energy as protein, 25-35% energy from fat, 35-60% energy from carbohydrates, less than 220m of dietary cholesterol, 20-35 gm of total fiber daily;Understanding of distribution of calorie intake throughout the day with the consumption of 4-5 meals/snacks   Sedentary Yes  [place] [facility] [place] [facility] [place] She also has a treadmill at home.   Intervention Provide advice, education, support and counseling about physical activity/exercise needs.;Develop an individualized exercise prescription for aerobic and resistive training based on initial evaluation findings, risk stratification, comorbidities and participant's personal goals.   Expected Outcomes Achievement of increased cardiorespiratory fitness and enhanced flexibility, muscular endurance and strength shown through measurements of functional capacity and personal statement of participant.   Increase Strength and Stamina Yes   Intervention Provide advice, education, support and counseling about physical activity/exercise  needs.;Develop an individualized exercise prescription for aerobic and resistive training based on initial evaluation findings, risk stratification, comorbidities and participant's personal goals.   Expected Outcomes Achievement of increased cardiorespiratory fitness and enhanced flexibility, muscular endurance and strength shown through measurements of functional capacity and personal statement of participant.   Improve shortness of breath with ADL's Yes   Intervention Provide education, individualized exercise plan and daily activity instruction to help decrease symptoms of SOB with activities of daily living.   Expected Outcomes Short Term: Achieves a reduction of symptoms when performing activities of daily living.   Develop more efficient breathing techniques such as purse lipped breathing and diaphragmatic breathing; and practicing self-pacing with activity Yes   Intervention Provide education, demonstration and support about specific breathing techniuqes utilized for more efficient breathing. Include techniques such as pursed lipped breathing, diaphragmatic breathing and self-pacing activity.   Expected Outcomes Short Term: Participant will be able to demonstrate and use breathing techniques as needed throughout daily activities.   Heart Failure Yes   Intervention Provide a combined exercise and nutrition program that is supplemented with education, support and counseling about heart failure.  Directed toward relieving symptoms such as shortness of breath, decreased exercise tolerance, and extremity edema.   Expected Outcomes Improve functional capacity of life;Short term: Attendance in program 2-3 days a week with increased exercise capacity. Reported lower sodium intake. Reported increased fruit and vegetable intake. Reports medication compliance.;Short term: Daily weights obtained and reported for increase. Utilizing diuretic protocols set by physician.;Long term: Adoption of self-care skills and  reduction of barriers for early signs and symptoms recognition and intervention leading to self-care maintenance.      Core Components/Risk Factors/Patient Goals Review:      Goals and Risk Factor Review    Row Name 04/28/16 1419             Core Components/Risk Factors/Patient Goals Review   Personal Goals Review Develop more efficient breathing techniques such as purse lipped breathing and diaphragmatic breathing and practicing self-pacing with activity.       Review Discussed with patient pursed lip breathing techniques and how this can help control SOB during exercise and daily activities. Patient demonstrated understanding of this concept.       Expected Outcomes Patient will use pursed lip breathing during exercise class and daily activities to help control SOB and thus be able to increase strength and stamina.           Core Components/Risk Factors/Patient Goals at Discharge (Final Review):      Goals and Risk Factor Review - 04/28/16 1419      Core Components/Risk Factors/Patient Goals Review   Personal Goals Review Develop more efficient breathing techniques such as purse lipped breathing and diaphragmatic breathing and practicing self-pacing with activity.   Review Discussed with patient pursed lip breathing techniques and how this can help control SOB during exercise and daily activities. Patient demonstrated understanding of this concept.   Expected Outcomes Patient will use pursed lip breathing during exercise class and daily activities to help control SOB and thus be able to increase strength and stamina.       ITP Comments:     ITP Comments    Row Name 05/05/16 0824 05/12/16 1529 06/10/16 1503 07/08/16 1357     ITP Comments Ms Silvernail will miss class today. Her right knee is still swollen, and she is waiting for an appointment with her orthopedic physician. Ms Santarelli called and informed [facility] she will wait until her orthopedic appointment to return to class. Her  appointment is on 05/23/16 Called to check on status to return to program.  She saw an orthopedist and does not have any injury.  She will need to see a neurologist as they are thinking it could be nerve damage.  To try starting Neurontin for pain.  She would like to hold her spot and will keep up posted on her status. Called Ms Tilley - she last attended 04/30/16. Her neurology appointment is not until November. Ms Hulon will be discharged and can return when she is able to attend on a regular basis.       Comments: Called Ms Birenbaum - she last attended 04/30/16. Her neurology appointment is not until November. Ms Jinkins will be discharged and can return when she is able to attend on a regular basis.

## 2016-08-11 ENCOUNTER — Other Ambulatory Visit: Payer: Self-pay | Admitting: Internal Medicine

## 2016-08-11 DIAGNOSIS — Z1231 Encounter for screening mammogram for malignant neoplasm of breast: Secondary | ICD-10-CM

## 2016-09-10 NOTE — Telephone Encounter (Signed)
Sara Hodges called and informed [facility] she will wait until her orthopedic appointment to return to class. Her appointment is on 05/23/16

## 2016-09-10 NOTE — Telephone Encounter (Signed)
Sara Hodges will miss class today. Her right knee is still swollen, and she is waiting for an appointment with her orthopedic physician.    By Alonza BogusLaureen M Brown

## 2016-09-23 ENCOUNTER — Ambulatory Visit
Admission: RE | Admit: 2016-09-23 | Discharge: 2016-09-23 | Disposition: A | Discharge status: Home / Self Care | Payer: Medicare HMO | Source: Ambulatory Visit | Attending: Internal Medicine | Admitting: Internal Medicine

## 2016-09-23 DIAGNOSIS — Z1231 Encounter for screening mammogram for malignant neoplasm of breast: Secondary | ICD-10-CM | POA: Diagnosis present

## 2016-11-25 NOTE — Telephone Encounter (Signed)
Called Ms Sara Hodges - she last attended 04/30/16. Her neurology appointment is not until November. Ms Sara Hodges will be discharged and can return when she is able to attend on a regular basis.    By Alonza BogusLaureen M Brown

## 2017-06-23 ENCOUNTER — Ambulatory Visit (INDEPENDENT_AMBULATORY_CARE_PROVIDER_SITE_OTHER): Payer: Medicare HMO | Admitting: General Surgery

## 2017-06-23 ENCOUNTER — Encounter: Payer: Self-pay | Admitting: General Surgery

## 2017-06-23 VITALS — BP 130/80 | HR 72 | Resp 14 | Ht 66.5 in | Wt 201.0 lb

## 2017-06-23 DIAGNOSIS — N611 Abscess of the breast and nipple: Secondary | ICD-10-CM | POA: Diagnosis not present

## 2017-06-23 MED ORDER — DOXYCYCLINE HYCLATE 100 MG PO CAPS: 100.0000 mg | ORAL_CAPSULE | Freq: Two times a day (BID) | ORAL | 0 refills | Status: AC

## 2017-06-23 NOTE — Progress Notes (Signed)
Patient ID: Sara Hodges, female   DOB: 03-01-1951, 66 y.o.   MRN: [id number]  Chief Complaint  Patient presents with  . Follow-up    HPI Sara Hodges is a 66 y.o. female.  She is here for evaluation of a left breast irritated "boil". She feel like it is in the same location as in 2016. The are is red and tender. She noticed this about one week ago. No drainage, she has been using warm compresses. She is not taking antibiotics.   HPI  Past Medical History:  Diagnosis Date  . CAD (coronary artery disease)   . CHF (congestive heart failure) (HCC)   . HLD (hyperlipidemia)   . Hypertension   . Pancreatic cyst   . Pancreatic mass     Past Surgical History:  Procedure Laterality Date  . ABDOMINAL HYSTERECTOMY  1985  . BREAST BIOPSY Right 2002   negative  . FOOT SURGERY  2016  . HAND SURGERY    . INCISION AND DRAINAGE Left 2010   breast     Family History  Problem Relation Age of Onset  . Lung cancer Mother     Social History Social History  Substance Use Topics  . Smoking status: Never Smoker  . Smokeless tobacco: Never Used  . Alcohol use No    Allergies  Allergen Reactions  . Beta Adrenergic Blockers Other (See Comments)    Second degree heart block  . Amoxicillin-Pot Clavulanate Nausea Only and Other (See Comments)    Has patient had a PCN reaction causing immediate rash, facial/tongue/throat swelling, SOB or lightheadedness with hypotension: No Has patient had a PCN reaction causing severe rash involving mucus membranes or skin necrosis: No Has patient had a PCN reaction that required hospitalization No Has patient had a PCN reaction occurring within the last 10 years: unknown If all of the above answers are "NO", then may proceed with Cephalosporin use.   . Tramadol Nausea Only    Current Outpatient Prescriptions  Medication Sig Dispense Refill  . aspirin EC 81 MG tablet Take 81 mg by mouth daily.     Marland Kitchen ENTRESTO 97-103 MG Take 1 tablet by mouth 2 (two)  times daily.    . folic acid (FOLVITE) 1 MG tablet Take 1 mg by mouth daily.    . furosemide (LASIX) 20 MG tablet Take 20 mg by mouth daily as needed for fluid.    Marland Kitchen spironolactone (ALDACTONE) 25 MG tablet Take 25 mg by mouth daily.    . Vitamin D, Ergocalciferol, (DRISDOL) 50000 UNITS CAPS capsule Take 50,000 Units by mouth every 7 (seven) days. Takes on Sunday    . doxycycline (VIBRAMYCIN) 100 MG capsule Take 1 capsule (100 mg total) by mouth 2 (two) times daily. 20 capsule 0   No current facility-administered medications for this visit.     Review of Systems Review of Systems  Constitutional: Negative.   Respiratory: Negative.   Cardiovascular: Negative.     Blood pressure 130/80, pulse 72, resp. rate 14, height 5' 6.5" (1.689 m), weight 201 lb (91.2 kg).  Physical Exam Physical Exam  Constitutional: She is oriented to person, place, and time. She appears well-developed and well-nourished.  HENT:  Mouth/Throat: Oropharynx is clear and moist.  Eyes: Conjunctivae are normal. No scleral icterus.  Neck: Neck supple.  Cardiovascular: Normal rate, regular rhythm and normal heart sounds.   Pulmonary/Chest: Effort normal and breath sounds normal. No respiratory distress. Right breast exhibits no inverted nipple, no mass, no  nipple discharge, no skin change and no tenderness. Left breast exhibits no inverted nipple, no mass, no nipple discharge, no skin change and no tenderness.    Lymphadenopathy:    She has no cervical adenopathy.    She has no axillary adenopathy.  Neurological: She is alert and oriented to person, place, and time.  Skin: Skin is warm and dry.  Psychiatric: Her behavior is normal.    Data Reviewed Prior mammogram and notes reviewed  Assessment    Breast abscess - it is in a similar location to the one that was drained in 2016 and she was treated then with antibiotics. Cultures at that time were not obtained due to her antibiotic use. Today, patient was  agreeable to incision and drainage of the abscess and cultures were sent.    Procedure: incision and drainage of left breast abscess Area was prepped and draped with chloro prep and sterile towels. 1 mL 1% xylocaine was infiltrated in the central area. A cruciate incision was made and 10 mL of thick yellow pus without odor was drained. Corners of the incision were cut to leave a 1cm opening for continued drainage. Cultures were sent.  4 x 4 gauze was applied over wound. Patient tolerated the procedure well and was counseled on wound care.   Plan    Prescribed Doxycycline 100 mg BID for 10 days. Patient should wear a supportive bra and change dressing to keep it clean while the wound drains. Apply heat for pain relief and alternate Tylenol and Ibuprofen. Return in 2 weeks for reevaluation and await for culture results.      HPI, Physical Exam, Assessment and Plan have been scribed under the direction and in the presence of Kathreen Cosier, MD Dorathy Daft, RN  I have completed the exam and reviewed the above documentation for accuracy and completeness.  I agree with the above.  Museum/gallery conservator has been used and any errors in dictation or transcription are unintentional.  Mikaella Escalona G. Evette Cristal, M.D., F.A.C.S.   Gerlene Burdock G 06/23/2017, 12:12 PM

## 2017-06-23 NOTE — Patient Instructions (Signed)
Prescribed Doxycycline 100 mg twice a day for 10 days. Patient should wear a supportive bra and change dressing to keep it clean while the wound drains. Apply heat for pain relief and alternate Tylenol and Ibuprofen. Return in 2 weeks for reevaluation.

## 2017-06-28 LAB — ANAEROBIC AND AEROBIC CULTURE

## 2017-06-29 ENCOUNTER — Telehealth: Payer: Self-pay | Admitting: *Deleted

## 2017-06-29 NOTE — Telephone Encounter (Signed)
Patient called look for results that was done on 06/23/17

## 2017-07-07 ENCOUNTER — Ambulatory Visit (INDEPENDENT_AMBULATORY_CARE_PROVIDER_SITE_OTHER): Payer: Medicare HMO | Admitting: General Surgery

## 2017-07-07 ENCOUNTER — Encounter: Payer: Self-pay | Admitting: General Surgery

## 2017-07-07 VITALS — BP 124/78 | HR 74 | Resp 14 | Ht 64.0 in | Wt 201.0 lb

## 2017-07-07 DIAGNOSIS — N611 Abscess of the breast and nipple: Secondary | ICD-10-CM

## 2017-07-07 MED ORDER — METRONIDAZOLE 500 MG PO TABS: 500.0000 mg | ORAL_TABLET | Freq: Three times a day (TID) | ORAL | 0 refills | Status: AC

## 2017-07-07 NOTE — Patient Instructions (Signed)
Patient to return in one month. The patient is aware to call back for any questions or concerns. Rx sent.  

## 2017-07-07 NOTE — Progress Notes (Signed)
Patient ID: Sara Hodges, female   DOB: 08-24-1951, 66 y.o.   MRN: [id number]  Chief Complaint  Patient presents with  . Follow-up    HPI Sara Hodges is a 66 y.o. female here today for her two week left breast abscess, which was drained at last visit. Patient states the area is good.  HPI  Past Medical History:  Diagnosis Date  . CAD (coronary artery disease)   . CHF (congestive heart failure) (HCC)   . HLD (hyperlipidemia)   . Hypertension   . Pancreatic cyst   . Pancreatic mass     Past Surgical History:  Procedure Laterality Date  . ABDOMINAL HYSTERECTOMY  1985  . BREAST BIOPSY Right 2002   negative  . FOOT SURGERY  2016  . HAND SURGERY    . INCISION AND DRAINAGE Left 2010   breast     Family History  Problem Relation Age of Onset  . Lung cancer Mother     Social History Social History  Substance Use Topics  . Smoking status: Never Smoker  . Smokeless tobacco: Never Used  . Alcohol use No    Allergies  Allergen Reactions  . Beta Adrenergic Blockers Other (See Comments)    Second degree heart block  . Amoxicillin-Pot Clavulanate Nausea Only and Other (See Comments)    Has patient had a PCN reaction causing immediate rash, facial/tongue/throat swelling, SOB or lightheadedness with hypotension: No Has patient had a PCN reaction causing severe rash involving mucus membranes or skin necrosis: No Has patient had a PCN reaction that required hospitalization No Has patient had a PCN reaction occurring within the last 10 years: unknown If all of the above answers are "NO", then may proceed with Cephalosporin use.   . Tramadol Nausea Only    Current Outpatient Prescriptions  Medication Sig Dispense Refill  . aspirin EC 81 MG tablet Take 81 mg by mouth daily.     Marland Kitchen ENTRESTO 97-103 MG Take 1 tablet by mouth 2 (two) times daily.    . folic acid (FOLVITE) 1 MG tablet Take 1 mg by mouth daily.    . furosemide (LASIX) 20 MG tablet Take 20 mg by mouth daily as  needed for fluid.    Marland Kitchen spironolactone (ALDACTONE) 25 MG tablet Take 25 mg by mouth daily.    . Vitamin D, Ergocalciferol, (DRISDOL) 50000 UNITS CAPS capsule Take 50,000 Units by mouth every 7 (seven) days. Takes on Sunday    . metroNIDAZOLE (FLAGYL) 500 MG tablet Take 1 tablet (500 mg total) by mouth 3 (three) times daily. 21 tablet 0   No current facility-administered medications for this visit.     Review of Systems Review of Systems  Constitutional: Negative.   Respiratory: Negative.   Cardiovascular: Negative.     Blood pressure 124/78, pulse 74, resp. rate 14, height  (1.626 m), weight 201 lb (91.2 kg).  Physical Exam Physical Exam  Constitutional: She is oriented to person, place, and time. She appears well-developed and well-nourished.  Neck: Neck supple.  Pulmonary/Chest:    Lymphadenopathy:    She has no cervical adenopathy.  Neurological: She is alert and oriented to person, place, and time.  Skin: Skin is warm and dry.    Data Reviewed Culture from left breast abscess grew Bacteroides. This is unusual.  Assessment    Left breast abscess appears markedly better. However culture grew Bacteroides. In view of this will treat for 1 week with Flagyl  Plan    Return in 2 weeks.    Patient to return in one month. The patient is aware to call back for any questions or concerns.Rx sent.Take 1 table three times a day.  HPI, Physical Exam, Assessment and Plan have been scribed under the direction and in the presence of Kathreen Cosier, MD  Ples Specter, CMA  I have completed the exam and reviewed the above documentation for accuracy and completeness.  I agree with the above.  Museum/gallery conservator has been used and any errors in dictation or transcription are unintentional.  Kaela Beitz G. Evette Cristal, M.D., F.A.C.S.   Gerlene Burdock G 07/07/2017, 1:42 PM

## 2017-07-21 NOTE — Progress Notes (Signed)
07/22/2017 1:17 PM   Sara Hodges 12/30/1950 [id number]  Referring provider: Marguarite ArbourSparks, Jeffrey D, MD [address] [facility] [place], [place] [id number]  Chief Complaint  Patient presents with  . Hematuria    New Patient    HPI: 66 year old female who presents today for further evaluation of microscopic hematuria.  This is been seen on multiple routine urinalysis dating back to 03/2016 in the absence of infection.  Never previously had a urologic workup for this.  No personal history of gross hematuria.  Remote history of UTI but non recently.    UA today shows 10-3 red blood cells per high-powered field, otherwise negative.  Most recent renal imaging CT abdomen with contrast 05/2016 for further workup of pancreatic mass shows normal kidneys bilaterally without stones.  She is also had a recent MRI this month.  This also shows no renal pathology.  Her pancreatic mass is being followed closely with no plans for intervention at this time.  Takes a baby aspirin but no other antiplatelet or anticoagulation therapy.  She is a never smoker.   + exposure to second hand smoke.    PMH: Past Medical History:  Diagnosis Date  . CAD (coronary artery disease)   . CHF (congestive heart failure) (HCC)   . HLD (hyperlipidemia)   . Hypertension   . Pancreatic cyst   . Pancreatic mass     Surgical History: Past Surgical History:  Procedure Laterality Date  . ABDOMINAL HYSTERECTOMY  1985  . BREAST BIOPSY Right 2002   negative  . FOOT SURGERY  2016  . HAND SURGERY    . INCISION AND DRAINAGE Left 2010   breast     Home Medications:  Allergies as of 07/22/2017      Reactions   Beta Adrenergic Blockers Other (See Comments)   Second degree heart block   Amoxicillin-pot Clavulanate Nausea Only, Other (See Comments)   Has patient had a PCN reaction causing immediate rash, facial/tongue/throat swelling, SOB or lightheadedness with hypotension: No Has patient had a PCN  reaction causing severe rash involving mucus membranes or skin necrosis: No Has patient had a PCN reaction that required hospitalization No Has patient had a PCN reaction occurring within the last 10 years: unknown If all of the above answers are "NO", then may proceed with Cephalosporin use.   Tramadol Nausea Only      Medication List       Accurate as of 07/22/17  1:17 PM. Always use your most recent med list.          aspirin EC 81 MG tablet Take 81 mg by mouth daily.   ENTRESTO 97-103 MG Generic drug:  sacubitril-valsartan Take 1 tablet by mouth 2 (two) times daily.   folic acid 1 MG tablet Commonly known as:  FOLVITE Take 1 mg by mouth daily.   furosemide 20 MG tablet Commonly known as:  LASIX Take 20 mg by mouth daily as needed for fluid.   spironolactone 25 MG tablet Commonly known as:  ALDACTONE Take 25 mg by mouth daily.   Vitamin D (Ergocalciferol) 50000 units Caps capsule Commonly known as:  DRISDOL Take 50,000 Units by mouth every 7 (seven) days. Takes on Sunday       Allergies:  Allergies  Allergen Reactions  . Beta Adrenergic Blockers Other (See Comments)    Second degree heart block  . Amoxicillin-Pot Clavulanate Nausea Only and Other (See Comments)    Has patient had a PCN reaction  causing immediate rash, facial/tongue/throat swelling, SOB or lightheadedness with hypotension: No Has patient had a PCN reaction causing severe rash involving mucus membranes or skin necrosis: No Has patient had a PCN reaction that required hospitalization No Has patient had a PCN reaction occurring within the last 10 years: unknown If all of the above answers are "NO", then may proceed with Cephalosporin use.   . Tramadol Nausea Only    Family History: Family History  Problem Relation Age of Onset  . Lung cancer Mother     Social History:  reports that she has never smoked. She has never used smokeless tobacco. She reports that she does not drink alcohol or  use drugs.  ROS: UROLOGY Frequent Urination?: No Hard to postpone urination?: No Burning/pain with urination?: No Get up at night to urinate?: No Leakage of urine?: No Urine stream starts and stops?: No Trouble starting stream?: No Do you have to strain to urinate?: No Blood in urine?: No Urinary tract infection?: No Sexually transmitted disease?: No Injury to kidneys or bladder?: No Painful intercourse?: No Weak stream?: No Currently pregnant?: No Vaginal bleeding?: No Last menstrual period?: n  Gastrointestinal Nausea?: No Vomiting?: No Indigestion/heartburn?: Yes Diarrhea?: No Constipation?: No  Constitutional Fever: No Night sweats?: Yes Weight loss?: No Fatigue?: No  Skin Skin rash/lesions?: No Itching?: No  Eyes Blurred vision?: Yes Double vision?: No  Ears/Nose/Throat Sore throat?: No Sinus problems?: Yes  Hematologic/Lymphatic Swollen glands?: No Easy bruising?: No  Cardiovascular Leg swelling?: No Chest pain?: Yes  Respiratory Cough?: No Shortness of breath?: No  Endocrine Excessive thirst?: No  Musculoskeletal Back pain?: Yes Joint pain?: Yes  Neurological Headaches?: No Dizziness?: Yes  Psychologic Depression?: No Anxiety?: No  Physical Exam: BP 110/68   Pulse 75   Ht 5\' 6"  (1.676 m)   Wt 202 lb (91.6 kg)   BMI 32.60 kg/m   Constitutional:  Alert and oriented, No acute distress. HEENT: [place] AT, moist mucus membranes.  Trachea midline, no masses. Cardiovascular: No clubbing, cyanosis, or edema. Respiratory: Normal respiratory effort, no increased work of breathing. GI: Abdomen is soft, nontender, nondistended, no abdominal masses GU: No CVA tenderness.  Skin: No rashes, bruises or suspicious lesions.. Neurologic: Grossly intact, no focal deficits, moving all 4 extremities. Psychiatric: Normal mood and affect.  Laboratory Data: Lab Results  Component Value Date   WBC 6.1 03/26/2016   HGB 12.5 03/26/2016   HCT 35.7  03/26/2016   MCV 94.4 03/26/2016   PLT 163 03/26/2016    Lab Results  Component Value Date   CREATININE 1.04 (H) 03/27/2016    Urinalysis UA today reviewed, 3-10 red blood cells per high-power field, otherwise negative.  See EPIC  Pertinent Imaging: MRI abd 06/26/17 and CT abd w/ contrast 06/16/16 reports reviewed via care everywhere  Assessment & Plan:    1. Microscopic hematuria We discussed the differential diagnosis for microscopic hematuria including nephrolithiasis, renal or upper tract tumors, bladder stones, UTIs, or bladder tumors as well as undetermined etiologies. Per AUA guidelines, I did recommend complete microscopic hematuria evaluation including urine cytology, and office cystoscopy.  She has had multiple MRI and CT scans over the past few years which demonstrate no renal pathology, may consider deferring upper tract imaging.  We discussed today that without delayed imaging in the form of CT urogram, we be unable to evaluate her ureters.  Given her lack of risk factors, I think that this is a fairly low risk.  We will plan on pursuing a  cystoscopy/cytology.  If these are negative, we will discuss further whether or not to proceed with CT urogram.  All questions were answered.  - Urinalysis, Complete   Return in about 2 weeks (around 08/05/2017) for cysto.  Vanna Scotland, MD  [facility] [address], [address] [place], [place] [address] [phone]

## 2017-07-22 ENCOUNTER — Ambulatory Visit (INDEPENDENT_AMBULATORY_CARE_PROVIDER_SITE_OTHER): Payer: Medicare HMO | Admitting: Urology

## 2017-07-22 ENCOUNTER — Encounter: Payer: Self-pay | Admitting: Urology

## 2017-07-22 VITALS — BP 110/68 | HR 75 | Ht 66.0 in | Wt 202.0 lb

## 2017-07-22 DIAGNOSIS — R3129 Other microscopic hematuria: Secondary | ICD-10-CM | POA: Diagnosis not present

## 2017-07-22 DIAGNOSIS — D649 Anemia, unspecified: Secondary | ICD-10-CM | POA: Insufficient documentation

## 2017-07-22 LAB — URINALYSIS, COMPLETE
Bilirubin, UA: NEGATIVE
GLUCOSE, UA: NEGATIVE
KETONES UA: NEGATIVE
Leukocytes, UA: NEGATIVE
Nitrite, UA: NEGATIVE
PROTEIN UA: NEGATIVE
SPEC GRAV UA: 1.025 (ref 1.005–1.030)
UUROB: 0.2 mg/dL (ref 0.2–1.0)
pH, UA: 5.5 (ref 5.0–7.5)

## 2017-07-22 LAB — MICROSCOPIC EXAMINATION
BACTERIA UA: NONE SEEN
Epithelial Cells (non renal): NONE SEEN /hpf (ref 0–10)
WBC, UA: NONE SEEN /hpf (ref 0–?)

## 2017-07-22 NOTE — Patient Instructions (Signed)
Cystoscopy Cystoscopy is a procedure that is used to help diagnose and sometimes treat conditions that affect that lower urinary tract. The lower urinary tract includes the bladder and the tube that drains urine from the bladder out of the body (urethra). Cystoscopy is performed with a thin, tube-shaped instrument with a light and camera at the end (cystoscope). The cystoscope may be hard (rigid) or flexible, depending on the goal of the procedure.The cystoscope is inserted through the urethra, into the bladder. Cystoscopy may be recommended if you have:  Urinary tractinfections that keep coming back (recurring).  Blood in the urine (hematuria).  Loss of bladder control (urinary incontinence) or an overactive bladder.  Unusual cells found in a urine sample.  A blockage in the urethra.  Painful urination.  An abnormality in the bladder found during an intravenous pyelogram (IVP) or CT scan.  Cystoscopy may also be done to remove a sample of tissue to be examined under a microscope (biopsy). Tell a health care provider about:  Any allergies you have.  All medicines you are taking, including vitamins, herbs, eye drops, creams, and over-the-counter medicines.  Any problems you or family members have had with anesthetic medicines.  Any blood disorders you have.  Any surgeries you have had.  Any medical conditions you have.  Whether you are pregnant or may be pregnant. What are the risks? Generally, this is a safe procedure. However, problems may occur, including:  Infection.  Bleeding.  Allergic reactions to medicines.  Damage to other structures or organs.  What happens before the procedure?  Ask your health care provider about: ? Changing or stopping your regular medicines. This is especially important if you are taking diabetes medicines or blood thinners. ? Taking medicines such as aspirin and ibuprofen. These medicines can thin your blood. Do not take these medicines  before your procedure if your health care provider instructs you not to.  Follow instructions from your health care provider about eating or drinking restrictions.  You may be given antibiotic medicine to help prevent infection.  You may have an exam or testing, such as X-rays of the bladder, urethra, or kidneys.  You may have urine tests to check for signs of infection.  Plan to have someone take you home after the procedure. What happens during the procedure?  To reduce your risk of infection,your health care team will wash or sanitize their hands.  You will be given one or more of the following: ? A medicine to help you relax (sedative). ? A medicine to numb the area (local anesthetic).  The area around the opening of your urethra will be cleaned.  The cystoscope will be passed through your urethra into your bladder.  Germ-free (sterile)fluid will flow through the cystoscope to fill your bladder. The fluid will stretch your bladder so that your surgeon can clearly examine your bladder walls.  The cystoscope will be removed and your bladder will be emptied. The procedure may vary among health care providers and hospitals. What happens after the procedure?  You may have some soreness or pain in your abdomen and urethra. Medicines will be available to help you.  You may have some blood in your urine.  Do not drive for 24 hours if you received a sedative. This information is not intended to replace advice given to you by your health care provider. Make sure you discuss any questions you have with your health care provider. Document Released: 09/05/2000 Document Revised: 01/17/2016 Document Reviewed: 07/26/2015 Elsevier Interactive   Patient Education  2017 [employer]  

## 2017-08-04 ENCOUNTER — Ambulatory Visit: Payer: Medicare HMO | Admitting: Urology

## 2017-08-04 ENCOUNTER — Encounter: Payer: Self-pay | Admitting: Urology

## 2017-08-04 VITALS — BP 146/72 | HR 85 | Ht 66.0 in | Wt 202.8 lb

## 2017-08-04 DIAGNOSIS — R3129 Other microscopic hematuria: Secondary | ICD-10-CM | POA: Diagnosis not present

## 2017-08-04 LAB — URINALYSIS, COMPLETE
Bilirubin, UA: NEGATIVE
Glucose, UA: NEGATIVE
KETONES UA: NEGATIVE
NITRITE UA: NEGATIVE
Protein, UA: NEGATIVE
UUROB: 0.2 mg/dL (ref 0.2–1.0)
pH, UA: 5.5 (ref 5.0–7.5)

## 2017-08-04 LAB — MICROSCOPIC EXAMINATION: EPITHELIAL CELLS (NON RENAL): NONE SEEN /HPF (ref 0–10)

## 2017-08-04 MED ORDER — CIPROFLOXACIN HCL 500 MG PO TABS
500.0000 mg | ORAL_TABLET | Freq: Once | ORAL | Status: AC
  Administered 2017-08-04: 500 mg via ORAL

## 2017-08-04 MED ORDER — LIDOCAINE HCL 2 % EX GEL
1.0000 "application " | Freq: Once | CUTANEOUS | Status: AC
  Administered 2017-08-04: 1 via URETHRAL

## 2017-08-10 ENCOUNTER — Other Ambulatory Visit: Payer: Self-pay | Admitting: Internal Medicine

## 2017-08-10 ENCOUNTER — Encounter: Payer: Self-pay | Admitting: Urology

## 2017-08-10 ENCOUNTER — Encounter: Payer: Self-pay | Admitting: General Surgery

## 2017-08-10 ENCOUNTER — Ambulatory Visit (INDEPENDENT_AMBULATORY_CARE_PROVIDER_SITE_OTHER): Payer: Medicare HMO | Admitting: General Surgery

## 2017-08-10 VITALS — BP 136/78 | HR 75 | Resp 14 | Ht 66.0 in | Wt 201.0 lb

## 2017-08-10 DIAGNOSIS — N611 Abscess of the breast and nipple: Secondary | ICD-10-CM | POA: Diagnosis not present

## 2017-08-10 DIAGNOSIS — Z1231 Encounter for screening mammogram for malignant neoplasm of breast: Secondary | ICD-10-CM

## 2017-08-10 NOTE — Patient Instructions (Signed)
Follow up as needed

## 2017-08-10 NOTE — Progress Notes (Signed)
Patient ID: Sara Hodges, female   DOB: [birth year], 66 y.o.   MRN: [id number]  Chief Complaint  Patient presents with  . Follow-up    breast abscess    HPI Sara Coupnnie M Wyss is a 66 y.o. female here for follow up for left breast abscess. She reports the area is healed with no discomfort reported. Denies any changes of breast between first and second abscess episodes.  HPI  Past Medical History:  Diagnosis Date  . CAD (coronary artery disease)   . CHF (congestive heart failure) (HCC)   . HLD (hyperlipidemia)   . Hypertension   . Pancreatic cyst   . Pancreatic mass     Past Surgical History:  Procedure Laterality Date  . ABDOMINAL HYSTERECTOMY  1985  . BREAST BIOPSY Right 2002   negative  . FOOT SURGERY  2016  . HAND SURGERY    . INCISION AND DRAINAGE Left 2010   breast     Family History  Problem Relation Age of Onset  . Lung cancer Mother     Social History Social History   Tobacco Use  . Smoking status: Never Smoker  . Smokeless tobacco: Never Used  Substance Use Topics  . Alcohol use: No    Alcohol/week: 0.0 oz  . Drug use: No    Allergies  Allergen Reactions  . Beta Adrenergic Blockers Other (See Comments)    Second degree heart block  . Amoxicillin-Pot Clavulanate Nausea Only and Other (See Comments)    Has patient had a PCN reaction causing immediate rash, facial/tongue/throat swelling, SOB or lightheadedness with hypotension: No Has patient had a PCN reaction causing severe rash involving mucus membranes or skin necrosis: No Has patient had a PCN reaction that required hospitalization No Has patient had a PCN reaction occurring within the last 10 years: unknown If all of the above answers are "NO", then may proceed with Cephalosporin use.   . Tramadol Nausea Only    Current Outpatient Medications  Medication Sig Dispense Refill  . aspirin EC 81 MG tablet Take 81 mg by mouth daily.     Marland Kitchen. ENTRESTO 97-103 MG Take 1 tablet by mouth 2 (two) times daily.     . folic acid (FOLVITE) 1 MG tablet Take 1 mg by mouth daily.    . furosemide (LASIX) 20 MG tablet Take 20 mg by mouth daily as needed for fluid.    Marland Kitchen. spironolactone (ALDACTONE) 25 MG tablet Take 25 mg by mouth daily.    . Vitamin D, Ergocalciferol, (DRISDOL) 50000 UNITS CAPS capsule Take 50,000 Units by mouth every 7 (seven) days. Takes on Sunday     No current facility-administered medications for this visit.     Review of Systems Review of Systems  Constitutional: Negative.   Respiratory: Negative.   Cardiovascular: Negative.     Blood pressure 136/78, pulse 75, resp. rate 14, height 5\' 6"  (1.676 m), weight 201 lb (91.2 kg).  Physical Exam Physical Exam  Constitutional: She is oriented to person, place, and time. She appears well-developed and well-nourished.  Eyes: Conjunctivae are normal. No scleral icterus.  Neck: Neck supple.  Cardiovascular: Normal rate, regular rhythm and normal heart sounds.  Pulmonary/Chest: Effort normal and breath sounds normal. Right breast exhibits no inverted nipple, no mass, no nipple discharge, no skin change and no tenderness. Left breast exhibits no inverted nipple, no mass, no nipple discharge, no skin change and no tenderness.    Lymphadenopathy:    She has no cervical adenopathy.  She has no axillary adenopathy.  Neurological: She is alert and oriented to person, place, and time.  Skin: Skin is warm and dry.  Psychiatric: She has a normal mood and affect.    Data Reviewed Previous notes,  Wound culture, mammogram. Mammogram revealed scattered areas of fibroglandular density. No findings suspicious for malignancy.  Assessment    Left breast abscess follow up, culture grew bacteroides fragilis. Completed tx with flagyl. Area is well healed without evidence of abscess or infection. Likely skin abscess. Similar episode and location in 2016. Mammogram 1/18 stable. Symptoms and exam stable.     Plan    Return as needed.     HPI,  Physical Exam, Assessment and Plan have been scribed under the direction and in the presence of Kathreen CosierS. G. Innocence Schlotzhauer, MD  Carron Brazenaryl-Lyn Kennedy, LPN  I have completed the exam and reviewed the above documentation for accuracy and completeness.  I agree with the above.  [employer] has been used and any errors in dictation or transcription are unintentional.  Santosha Jividen G. Evette CristalSankar, M.D., F.A.C.S.  Gerlene BurdockSANKAR,Ekin Pilar G 08/10/2017, 12:57 PM

## 2017-08-10 NOTE — Progress Notes (Signed)
   08/10/17  CC:  Chief Complaint  Patient presents with  . Cysto    HPI: 66 year old female with microscopic hematuria who presents today for cystoscopy to complete her workup.  No personal history of gross hematuria.  Remote history of UTI but non recently.    UA today shows 10-3 red blood cells per high-powered field, otherwise negative.  Most recent renal imaging CT abdomen with contrast 05/2016 for further workup of pancreatic mass shows normal kidneys bilaterally without stones.  She is also had a recent MRI this month.  This also shows no renal pathology.  Her pancreatic mass is being followed closely with no plans for intervention at this time.  Takes a baby aspirin but no other antiplatelet or anticoagulation therapy.  She is a never smoker.   + exposure to second hand smoke   Blood pressure (!) 146/72, pulse 85, height 5\' 6"  (1.676 m), weight 202 lb 12.8 oz (92 kg). NED. A&Ox3.   No respiratory distress   Abd soft, NT, ND Normal external genitalia with patent urethral meatus  Cystoscopy Procedure Note  Patient identification was confirmed, informed consent was obtained, and patient was prepped using Betadine solution.  Lidocaine jelly was administered per urethral meatus.    Preoperative abx where received prior to procedure.    Procedure: - Flexible cystoscope introduced, without any difficulty.   - Thorough search of the bladder revealed:    normal urethral meatus    normal urothelium    no stones    no ulcers     no tumors    no urethral polyps    no trabeculation  - Ureteral orifices were normal in position and appearance.  Post-Procedure: - Patient tolerated the procedure well  Assessment/ Plan:  1. Microscopic hematuria Status post unremarkable cystoscopy  Over the past several years, she has had multiple CT scans/MRIs all of which showed normal kidneys.  We discussed that despite her multiple scans, her ureters have not been completely  evaluated.  That being said, she is a never smoker and has very low risk for upper tract urothelial carcinoma.  Options for further imaging include a repeat CT scan in the form of CT urogram with delayed imaging versus proceeding to the operating room for bilateral retrograde pyelogram.  At this point time, given her very low risk of upper tract urothelial cancer, she would like to defer further upper tract imaging.  She understands the risk of missed diagnosis.    All questions answered. - Urinalysis, Complete - ciprofloxacin (CIPRO) tablet 500 mg - lidocaine (XYLOCAINE) 2 % jelly 1 application   Sara ScotlandAshley Youssouf Shipley, MD

## 2017-12-30 ENCOUNTER — Ambulatory Visit
Admission: RE | Admit: 2017-12-30 | Discharge: 2017-12-30 | Disposition: A | Discharge status: Home / Self Care | Payer: Medicare HMO | Source: Ambulatory Visit | Attending: Internal Medicine | Admitting: Internal Medicine

## 2017-12-30 DIAGNOSIS — Z1231 Encounter for screening mammogram for malignant neoplasm of breast: Secondary | ICD-10-CM | POA: Insufficient documentation

## 2018-12-16 ENCOUNTER — Other Ambulatory Visit: Payer: Self-pay | Admitting: Internal Medicine

## 2018-12-16 DIAGNOSIS — Z1231 Encounter for screening mammogram for malignant neoplasm of breast: Secondary | ICD-10-CM

## 2019-05-17 ENCOUNTER — Other Ambulatory Visit: Payer: Self-pay

## 2019-05-17 ENCOUNTER — Ambulatory Visit
Admission: RE | Admit: 2019-05-17 | Discharge: 2019-05-17 | Disposition: A | Discharge status: Home / Self Care | Payer: Medicare HMO | Source: Ambulatory Visit | Attending: Internal Medicine | Admitting: Internal Medicine

## 2019-05-17 ENCOUNTER — Encounter (INDEPENDENT_AMBULATORY_CARE_PROVIDER_SITE_OTHER): Payer: Self-pay

## 2019-05-17 DIAGNOSIS — Z1231 Encounter for screening mammogram for malignant neoplasm of breast: Secondary | ICD-10-CM | POA: Diagnosis not present

## 2019-05-19 ENCOUNTER — Other Ambulatory Visit: Payer: Self-pay | Admitting: Internal Medicine

## 2019-05-19 DIAGNOSIS — R928 Other abnormal and inconclusive findings on diagnostic imaging of breast: Secondary | ICD-10-CM

## 2019-05-19 DIAGNOSIS — N6489 Other specified disorders of breast: Secondary | ICD-10-CM

## 2019-05-26 ENCOUNTER — Ambulatory Visit
Admission: RE | Admit: 2019-05-26 | Discharge: 2019-05-26 | Disposition: A | Discharge status: Home / Self Care | Payer: Medicare HMO | Source: Ambulatory Visit | Attending: Internal Medicine | Admitting: Internal Medicine

## 2019-05-26 DIAGNOSIS — R928 Other abnormal and inconclusive findings on diagnostic imaging of breast: Secondary | ICD-10-CM | POA: Diagnosis not present

## 2019-05-26 DIAGNOSIS — N6489 Other specified disorders of breast: Secondary | ICD-10-CM

## 2019-06-03 ENCOUNTER — Other Ambulatory Visit: Payer: Self-pay | Admitting: Internal Medicine

## 2019-06-03 DIAGNOSIS — N6489 Other specified disorders of breast: Secondary | ICD-10-CM

## 2019-07-27 ENCOUNTER — Other Ambulatory Visit: Payer: Self-pay | Admitting: Internal Medicine

## 2019-07-27 DIAGNOSIS — R1010 Upper abdominal pain, unspecified: Secondary | ICD-10-CM

## 2019-08-04 ENCOUNTER — Ambulatory Visit
Admission: RE | Admit: 2019-08-04 | Discharge: 2019-08-04 | Disposition: A | Discharge status: Home / Self Care | Payer: Medicare HMO | Source: Ambulatory Visit | Attending: Internal Medicine | Admitting: Internal Medicine

## 2019-08-04 ENCOUNTER — Other Ambulatory Visit: Payer: Self-pay

## 2019-08-04 DIAGNOSIS — R1010 Upper abdominal pain, unspecified: Secondary | ICD-10-CM | POA: Diagnosis not present

## 2019-08-05 ENCOUNTER — Other Ambulatory Visit: Payer: Self-pay | Admitting: Internal Medicine

## 2019-08-05 DIAGNOSIS — K8689 Other specified diseases of pancreas: Secondary | ICD-10-CM

## 2019-08-09 ENCOUNTER — Other Ambulatory Visit (HOSPITAL_COMMUNITY): Payer: Self-pay | Admitting: Internal Medicine

## 2019-08-09 DIAGNOSIS — K8689 Other specified diseases of pancreas: Secondary | ICD-10-CM

## 2019-08-17 ENCOUNTER — Encounter (HOSPITAL_COMMUNITY): Payer: Self-pay

## 2019-08-17 ENCOUNTER — Ambulatory Visit (HOSPITAL_COMMUNITY): Payer: Medicare HMO

## 2019-09-09 DIAGNOSIS — Z950 Presence of cardiac pacemaker: Secondary | ICD-10-CM

## 2019-09-09 HISTORY — DX: Presence of cardiac pacemaker: Z95.0

## 2019-11-17 ENCOUNTER — Other Ambulatory Visit: Payer: Self-pay | Admitting: Internal Medicine

## 2019-11-17 DIAGNOSIS — R928 Other abnormal and inconclusive findings on diagnostic imaging of breast: Secondary | ICD-10-CM

## 2019-11-17 DIAGNOSIS — N6489 Other specified disorders of breast: Secondary | ICD-10-CM

## 2020-01-18 ENCOUNTER — Ambulatory Visit
Admission: RE | Admit: 2020-01-18 | Discharge: 2020-01-18 | Disposition: A | Discharge status: Home / Self Care | Payer: Medicare HMO | Source: Ambulatory Visit | Attending: Internal Medicine | Admitting: Internal Medicine

## 2020-01-18 DIAGNOSIS — R928 Other abnormal and inconclusive findings on diagnostic imaging of breast: Secondary | ICD-10-CM

## 2020-01-18 DIAGNOSIS — N6489 Other specified disorders of breast: Secondary | ICD-10-CM | POA: Diagnosis present

## 2020-06-13 ENCOUNTER — Other Ambulatory Visit: Payer: Self-pay | Admitting: Internal Medicine

## 2020-06-13 DIAGNOSIS — Z1231 Encounter for screening mammogram for malignant neoplasm of breast: Secondary | ICD-10-CM

## 2020-06-13 DIAGNOSIS — N6489 Other specified disorders of breast: Secondary | ICD-10-CM

## 2020-06-26 ENCOUNTER — Other Ambulatory Visit: Payer: Self-pay

## 2020-06-26 ENCOUNTER — Ambulatory Visit
Admission: RE | Admit: 2020-06-26 | Discharge: 2020-06-26 | Disposition: A | Discharge status: Home / Self Care | Payer: Medicare HMO | Source: Ambulatory Visit | Attending: Internal Medicine | Admitting: Internal Medicine

## 2020-06-26 DIAGNOSIS — N6489 Other specified disorders of breast: Secondary | ICD-10-CM | POA: Insufficient documentation

## 2021-06-04 ENCOUNTER — Other Ambulatory Visit: Payer: Self-pay | Admitting: Internal Medicine

## 2021-06-04 DIAGNOSIS — N6489 Other specified disorders of breast: Secondary | ICD-10-CM

## 2021-07-03 ENCOUNTER — Ambulatory Visit
Admission: RE | Admit: 2021-07-03 | Discharge: 2021-07-03 | Disposition: A | Discharge status: Home / Self Care | Payer: Medicare HMO | Source: Ambulatory Visit | Attending: Internal Medicine | Admitting: Internal Medicine

## 2021-07-03 ENCOUNTER — Other Ambulatory Visit: Payer: Self-pay

## 2021-07-03 DIAGNOSIS — N6489 Other specified disorders of breast: Secondary | ICD-10-CM | POA: Diagnosis not present

## 2022-04-10 ENCOUNTER — Other Ambulatory Visit: Payer: Self-pay | Admitting: Internal Medicine

## 2022-04-10 DIAGNOSIS — Z1231 Encounter for screening mammogram for malignant neoplasm of breast: Secondary | ICD-10-CM

## 2022-07-07 ENCOUNTER — Ambulatory Visit
Admission: RE | Admit: 2022-07-07 | Discharge: 2022-07-07 | Disposition: A | Discharge status: Home / Self Care | Payer: Medicare HMO | Source: Ambulatory Visit | Attending: Internal Medicine | Admitting: Internal Medicine

## 2022-07-07 DIAGNOSIS — Z1231 Encounter for screening mammogram for malignant neoplasm of breast: Secondary | ICD-10-CM | POA: Insufficient documentation

## 2023-04-21 ENCOUNTER — Other Ambulatory Visit: Payer: Self-pay | Admitting: Internal Medicine

## 2023-04-21 DIAGNOSIS — Z1231 Encounter for screening mammogram for malignant neoplasm of breast: Secondary | ICD-10-CM

## 2023-07-13 ENCOUNTER — Ambulatory Visit
Admission: RE | Admit: 2023-07-13 | Discharge: 2023-07-13 | Disposition: A | Discharge status: Home / Self Care | Payer: Medicare HMO | Source: Ambulatory Visit | Attending: Internal Medicine | Admitting: Internal Medicine

## 2023-07-13 DIAGNOSIS — Z1231 Encounter for screening mammogram for malignant neoplasm of breast: Secondary | ICD-10-CM | POA: Diagnosis present

## 2023-08-25 ENCOUNTER — Telehealth: Payer: Self-pay

## 2023-08-25 NOTE — Telephone Encounter (Signed)
Pt missed a call from this backline #. No message in chart. Pt does not come to [facility] and could have been wrong #.pt voiced understanding.and nothing further needed.

## 2023-12-28 ENCOUNTER — Other Ambulatory Visit: Payer: Self-pay

## 2023-12-28 ENCOUNTER — Encounter: Attending: Cardiovascular Disease

## 2023-12-28 DIAGNOSIS — Z7902 Long term (current) use of antithrombotics/antiplatelets: Secondary | ICD-10-CM | POA: Insufficient documentation

## 2023-12-28 DIAGNOSIS — Z955 Presence of coronary angioplasty implant and graft: Secondary | ICD-10-CM | POA: Insufficient documentation

## 2023-12-28 DIAGNOSIS — Z48812 Encounter for surgical aftercare following surgery on the circulatory system: Secondary | ICD-10-CM | POA: Insufficient documentation

## 2023-12-28 NOTE — Progress Notes (Signed)
 Virtual Visit completed. Patient informed on EP and RD appointment and 6 Minute walk test. Patient also informed of patient health questionnaires on My Chart. Patient Verbalizes understanding. Visit diagnosis can be found in [facility] 11/20/2023.

## 2024-01-04 ENCOUNTER — Encounter

## 2024-01-04 VITALS — Ht 65.39 in | Wt 171.4 lb

## 2024-01-04 DIAGNOSIS — Z955 Presence of coronary angioplasty implant and graft: Secondary | ICD-10-CM

## 2024-01-04 DIAGNOSIS — Z48812 Encounter for surgical aftercare following surgery on the circulatory system: Secondary | ICD-10-CM | POA: Diagnosis present

## 2024-01-04 DIAGNOSIS — Z7902 Long term (current) use of antithrombotics/antiplatelets: Secondary | ICD-10-CM | POA: Diagnosis not present

## 2024-01-04 NOTE — Patient Instructions (Signed)
 Patient Instructions  Patient Details  Name: Sara Hodges MRN: [id number] Date of Birth: November 05, 1950 Referring Provider:  Francesco Runner, MD  Below are your personal goals for exercise, nutrition, and risk factors. Our goal is to help you stay on track towards obtaining and maintaining these goals. We will be discussing your progress on these goals with you throughout the program.  Initial Exercise Prescription:  Initial Exercise Prescription - 01/04/24 1300       Date of Initial Exercise RX and Referring Provider   Date 01/04/24    Referring Provider Youlanda Mighty MD      Oxygen   Maintain Oxygen Saturation 88% or higher      Recumbant Bike   Level 2    RPM 50    Watts 20    Minutes 15    METs 2.83      NuStep   Level 2    SPM 80    Minutes 15    METs 2.83      REL-XR   Level 1    Speed 50    Minutes 15    METs 2.83      T5 Nustep   Level 2    SPM 80    Minutes 15    METs 2.83      Track   Laps 33    Minutes 15    METs 2.79      Prescription Details   Frequency (times per week) 2    Duration Progress to 30 minutes of continuous aerobic without signs/symptoms of physical distress      Intensity   THRR 40-80% of Max Heartrate 100-131    Ratings of Perceived Exertion 11-13    Perceived Dyspnea 0-4      Progression   Progression Continue to progress workloads to maintain intensity without signs/symptoms of physical distress.      Resistance Training   Training Prescription Yes    Weight 4 lb    Reps 10-15             Exercise Goals: Frequency: Be able to perform aerobic exercise two to three times per week in program working toward 2-5 days per week of home exercise.  Intensity: Work with a perceived exertion of 11 (fairly light) - 15 (hard) while following your exercise prescription.  We will make changes to your prescription with you as you progress through the program.   Duration: Be able to do 30 to 45 minutes of continuous aerobic  exercise in addition to a 5 minute warm-up and a 5 minute cool-down routine.   Nutrition Goals: Your personal nutrition goals will be established when you do your nutrition analysis with the dietician.  The following are general nutrition guidelines to follow: Cholesterol < 200mg /day Sodium < 1500mg /day Fiber: Women over 50 yrs - 21 grams per day  Personal Goals:  Personal Goals and Risk Factors at Admission - 01/04/24 1359       Core Components/Risk Factors/Patient Goals on Admission    Weight Management Yes;Weight Loss    Intervention Weight Management: Develop a combined nutrition and exercise program designed to reach desired caloric intake, while maintaining appropriate intake of nutrient and fiber, sodium and fats, and appropriate energy expenditure required for the weight goal.;Weight Management: Provide education and appropriate resources to help participant work on and attain dietary goals.;Weight Management/Obesity: Establish reasonable short term and long term weight goals.    Admit Weight 171 lb 6.4 oz (77.7 kg)  Goal Weight: Short Term 158 lb 3.2 oz (71.8 kg)    Goal Weight: Long Term 145 lb (65.8 kg)    Expected Outcomes Short Term: Continue to assess and modify interventions until short term weight is achieved;Long Term: Adherence to nutrition and physical activity/exercise program aimed toward attainment of established weight goal;Understanding recommendations for meals to include 15-35% energy as protein, 25-35% energy from fat, 35-60% energy from carbohydrates, less than 200mg  of dietary cholesterol, 20-35 gm of total fiber daily;Understanding of distribution of calorie intake throughout the day with the consumption of 4-5 meals/snacks;Weight Loss: Understanding of general recommendations for a balanced deficit meal plan, which promotes 1-2 lb weight loss per week and includes a negative energy balance of [phone] kcal/d    Heart Failure Yes    Intervention Provide a  combined exercise and nutrition program that is supplemented with education, support and counseling about heart failure. Directed toward relieving symptoms such as shortness of breath, decreased exercise tolerance, and extremity edema.    Expected Outcomes Improve functional capacity of life;Short term: Attendance in program 2-3 days a week with increased exercise capacity. Reported lower sodium intake. Reported increased fruit and vegetable intake. Reports medication compliance.;Short term: Daily weights obtained and reported for increase. Utilizing diuretic protocols set by physician.;Long term: Adoption of self-care skills and reduction of barriers for early signs and symptoms recognition and intervention leading to self-care maintenance.    Hypertension Yes    Intervention Provide education on lifestyle modifcations including regular physical activity/exercise, weight management, moderate sodium restriction and increased consumption of fresh fruit, vegetables, and low fat dairy, alcohol moderation, and smoking cessation.;Monitor prescription use compliance.    Expected Outcomes Short Term: Continued assessment and intervention until BP is < 140/61mm HG in hypertensive participants. < 130/40mm HG in hypertensive participants with diabetes, heart failure or chronic kidney disease.;Long Term: Maintenance of blood pressure at goal levels.    Lipids Yes    Intervention Provide education and support for participant on nutrition & aerobic/resistive exercise along with prescribed medications to achieve LDL 70mg , HDL >40mg .    Expected Outcomes Short Term: Participant states understanding of desired cholesterol values and is compliant with medications prescribed. Participant is following exercise prescription and nutrition guidelines.;Long Term: Cholesterol controlled with medications as prescribed, with individualized exercise RX and with personalized nutrition plan. Value goals: LDL < 70mg , HDL > 40 mg.              Tobacco Use Initial Evaluation: Social History   Tobacco Use  Smoking Status Never  Smokeless Tobacco Never    Exercise Goals and Review:  Exercise Goals     Row Name 01/04/24 1356             Exercise Goals   Increase Physical Activity Yes       Intervention Provide advice, education, support and counseling about physical activity/exercise needs.;Develop an individualized exercise prescription for aerobic and resistive training based on initial evaluation findings, risk stratification, comorbidities and participant's personal goals.       Expected Outcomes Short Term: Attend rehab on a regular basis to increase amount of physical activity.;Long Term: Add in home exercise to make exercise part of routine and to increase amount of physical activity.;Long Term: Exercising regularly at least 3-5 days a week.       Increase Strength and Stamina Yes       Intervention Provide advice, education, support and counseling about physical activity/exercise needs.;Develop an individualized exercise prescription for aerobic and resistive training  based on initial evaluation findings, risk stratification, comorbidities and participant's personal goals.       Expected Outcomes Short Term: Increase workloads from initial exercise prescription for resistance, speed, and METs.;Short Term: Perform resistance training exercises routinely during rehab and add in resistance training at home;Long Term: Improve cardiorespiratory fitness, muscular endurance and strength as measured by increased METs and functional capacity ( )       Able to understand and use rate of perceived exertion (RPE) scale Yes       Intervention Provide education and explanation on how to use RPE scale       Expected Outcomes Short Term: Able to use RPE daily in rehab to express subjective intensity level;Long Term:  Able to use RPE to guide intensity level when exercising independently       Able to understand and use  Dyspnea scale Yes       Intervention Provide education and explanation on how to use Dyspnea scale       Expected Outcomes Short Term: Able to use Dyspnea scale daily in rehab to express subjective sense of shortness of breath during exertion;Long Term: Able to use Dyspnea scale to guide intensity level when exercising independently       Knowledge and understanding of Target Heart Rate Range (THRR) Yes       Intervention Provide education and explanation of THRR including how the numbers were predicted and where they are located for reference       Expected Outcomes Short Term: Able to state/look up THRR;Short Term: Able to use daily as guideline for intensity in rehab;Long Term: Able to use THRR to govern intensity when exercising independently       Able to check pulse independently Yes       Intervention Provide education and demonstration on how to check pulse in carotid and radial arteries.;Review the importance of being able to check your own pulse for safety during independent exercise       Expected Outcomes Short Term: Able to explain why pulse checking is important during independent exercise;Long Term: Able to check pulse independently and accurately       Understanding of Exercise Prescription Yes       Intervention Provide education, explanation, and written materials on patient's individual exercise prescription       Expected Outcomes Short Term: Able to explain program exercise prescription;Long Term: Able to explain home exercise prescription to exercise independently

## 2024-01-04 NOTE — Progress Notes (Signed)
 Cardiac Individual Treatment Plan  Patient Details  Name: Sara Hodges MRN: [id number] Date of Birth: 10-31-1950 Referring Provider:   Flowsheet Row Cardiac Rehab from 01/04/2024 in [facility] Cardiac and Pulmonary Rehab  Referring Provider Youlanda Mighty MD       Initial Encounter Date:  Flowsheet Row Cardiac Rehab from 01/04/2024 in [facility] Cardiac and Pulmonary Rehab  Date 01/04/24       Visit Diagnosis: Status post coronary artery stent placement  Patient's Home Medications on Admission:  Current Outpatient Medications:    acetaminophen (TYLENOL) 500 MG tablet, Take by mouth., Disp: , Rfl:    aspirin EC 81 MG tablet, Take 81 mg by mouth daily. , Disp: , Rfl:    celecoxib (CELEBREX) 200 MG capsule, , Disp: , Rfl:    clopidogrel (PLAVIX) 75 MG tablet, Take 75 mg by mouth daily., Disp: , Rfl:    ENTRESTO 49-51 MG, Take 1 tablet by mouth 2 (two) times daily. (Patient not taking: Reported on 12/28/2023), Disp: , Rfl:    ENTRESTO 97-103 MG, Take 1 tablet by mouth 2 (two) times daily., Disp: , Rfl:    folic acid (FOLVITE) 1 MG tablet, Take 1 mg by mouth daily. (Patient not taking: Reported on 12/28/2023), Disp: , Rfl:    folic acid (FOLVITE) 1 MG tablet, Take by mouth., Disp: , Rfl:    furosemide (LASIX) 20 MG tablet, Take 20 mg by mouth daily as needed for fluid., Disp: , Rfl:    levocetirizine (XYZAL) 5 MG tablet, Take 1 tablet by mouth every evening., Disp: , Rfl:    metoprolol succinate (TOPROL-XL) 25 MG 24 hr tablet, , Disp: , Rfl:    metoprolol succinate (TOPROL-XL) 25 MG 24 hr tablet, Take by mouth., Disp: , Rfl:    nitroGLYCERIN (NITROSTAT) 0.4 MG SL tablet, Place under the tongue., Disp: , Rfl:    pantoprazole (PROTONIX) 40 MG tablet, Take 40 mg by mouth daily., Disp: , Rfl:    polyethylene glycol powder (GLYCOLAX/MIRALAX) 17 GM/SCOOP powder, Take by mouth. (Patient not taking: Reported on 12/28/2023), Disp: , Rfl:    predniSONE (DELTASONE) 10 MG tablet, 2 tabs daily for 4 days, 1 tab  daily for 4 days, Disp: , Rfl:    spironolactone (ALDACTONE) 25 MG tablet, Take 25 mg by mouth daily. (Patient not taking: Reported on 12/28/2023), Disp: , Rfl:    triamcinolone ointment (KENALOG) 0.1 %, Apply twice daily to raised itchy areas until smooth (typically 5-10 days) (Patient not taking: Reported on 12/28/2023), Disp: , Rfl:    Vitamin D, Ergocalciferol, (DRISDOL) 50000 UNITS CAPS capsule, Take 50,000 Units by mouth every 7 (seven) days. Takes on Sunday, Disp: , Rfl:   Past Medical History: Past Medical History:  Diagnosis Date   CAD (coronary artery disease)    CHF (congestive heart failure) (HCC)    HLD (hyperlipidemia)    Hypertension    Pancreatic cyst    Pancreatic mass     Tobacco Use: Social History   Tobacco Use  Smoking Status Never  Smokeless Tobacco Never    Labs: Review Flowsheet        No data to display           Exercise Target Goals: Exercise Program Goal: Individual exercise prescription set using results from initial 6 min walk test and THRR while considering  patient's activity barriers and safety.   Exercise Prescription Goal: Initial exercise prescription builds to 30-45 minutes a day of aerobic activity, 2-3 days per week.  Home exercise guidelines will be given to patient during program as part of exercise prescription that the participant will acknowledge.   Education: Aerobic Exercise: - Group verbal and visual presentation on the components of exercise prescription. Introduces F.I.T.T principle from ACSM for exercise prescriptions.  Reviews F.I.T.T. principles of aerobic exercise including progression. Written material given at graduation.   Education: Resistance Exercise: - Group verbal and visual presentation on the components of exercise prescription. Introduces F.I.T.T principle from ACSM for exercise prescriptions  Reviews F.I.T.T. principles of resistance exercise including progression. Written material given at graduation.     Education: Exercise & Equipment Safety: - Individual verbal instruction and demonstration of equipment use and safety with use of the equipment. Flowsheet Row Cardiac Rehab from 01/04/2024 in [facility] Cardiac and Pulmonary Rehab  Date 01/04/24  Educator MB  Instruction Review Code 1- Verbalizes Understanding       Education: Exercise Physiology & General Exercise Guidelines: - Group verbal and written instruction with models to review the exercise physiology of the cardiovascular system and associated critical values. Provides general exercise guidelines with specific guidelines to those with heart or lung disease.    Education: Flexibility, Balance, Mind/Body Relaxation: - Group verbal and visual presentation with interactive activity on the components of exercise prescription. Introduces F.I.T.T principle from ACSM for exercise prescriptions. Reviews F.I.T.T. principles of flexibility and balance exercise training including progression. Also discusses the mind body connection.  Reviews various relaxation techniques to help reduce and manage stress (i.e. Deep breathing, progressive muscle relaxation, and visualization). Balance handout provided to take home. Written material given at graduation.   Activity Barriers & Risk Stratification:  Activity Barriers & Cardiac Risk Stratification - 01/04/24 1352       Activity Barriers & Cardiac Risk Stratification   Activity Barriers Back Problems;Other (comment)    Comments L leg aches (mainly at night), occasional low and mid back pain due to degenerative disc disease    Cardiac Risk Stratification Moderate             6 Minute Walk:  6 Minute Walk     Row Name 01/04/24 1350         6 Minute Walk   Phase Initial     Distance 1390 feet     Walk Time 6 minutes     # of Rest Breaks 0     MPH 2.63     METS 2.83     RPE 9     Perceived Dyspnea  0     VO2 Peak 9.89     Symptoms No     Resting HR 69 bpm     Resting BP 108/70      Resting Oxygen Saturation  99 %     Exercise Oxygen Saturation  during 6 min walk 100 %     Max Ex. HR 90 bpm     Max Ex. BP 146/68     2 Minute Post BP 118/66              Oxygen Initial Assessment:   Oxygen Re-Evaluation:   Oxygen Discharge (Final Oxygen Re-Evaluation):   Initial Exercise Prescription:  Initial Exercise Prescription - 01/04/24 1300       Date of Initial Exercise RX and Referring Provider   Date 01/04/24    Referring Provider Youlanda Mighty MD      Oxygen   Maintain Oxygen Saturation 88% or higher      Recumbant Bike   Level 2  RPM 50    Watts 20    Minutes 15    METs 2.83      NuStep   Level 2    SPM 80    Minutes 15    METs 2.83      REL-XR   Level 1    Speed 50    Minutes 15    METs 2.83      T5 Nustep   Level 2    SPM 80    Minutes 15    METs 2.83      Track   Laps 33    Minutes 15    METs 2.79      Prescription Details   Frequency (times per week) 2    Duration Progress to 30 minutes of continuous aerobic without signs/symptoms of physical distress      Intensity   THRR 40-80% of Max Heartrate 100-131    Ratings of Perceived Exertion 11-13    Perceived Dyspnea 0-4      Progression   Progression Continue to progress workloads to maintain intensity without signs/symptoms of physical distress.      Resistance Training   Training Prescription Yes    Weight 4 lb    Reps 10-15             Perform Capillary Blood Glucose checks as needed.  Exercise Prescription Changes:   Exercise Prescription Changes     Row Name 01/04/24 1300             Response to Exercise   Blood Pressure (Admit) 108/70       Blood Pressure (Exercise) 146/68       Blood Pressure (Exit) 118/66       Heart Rate (Admit) 69 bpm       Heart Rate (Exercise) 90 bpm       Heart Rate (Exit) 69 bpm       Oxygen Saturation (Admit) 99 %       Oxygen Saturation (Exercise) 100 %       Oxygen Saturation (Exit) 100 %       Rating of  Perceived Exertion (Exercise) 9       Perceived Dyspnea (Exercise) 0       Symptoms none       Comments results       Duration Progress to 30 minutes of  aerobic without signs/symptoms of physical distress       Intensity THRR New         Progression   Progression Continue to progress workloads to maintain intensity without signs/symptoms of physical distress.       Average METs 2.83                Exercise Comments:   Exercise Goals and Review:   Exercise Goals     Row Name 01/04/24 1356             Exercise Goals   Increase Physical Activity Yes       Intervention Provide advice, education, support and counseling about physical activity/exercise needs.;Develop an individualized exercise prescription for aerobic and resistive training based on initial evaluation findings, risk stratification, comorbidities and participant's personal goals.       Expected Outcomes Short Term: Attend rehab on a regular basis to increase amount of physical activity.;Long Term: Add in home exercise to make exercise part of routine and to increase amount of physical activity.;Long Term: Exercising regularly at least 3-5 days a  week.       Increase Strength and Stamina Yes       Intervention Provide advice, education, support and counseling about physical activity/exercise needs.;Develop an individualized exercise prescription for aerobic and resistive training based on initial evaluation findings, risk stratification, comorbidities and participant's personal goals.       Expected Outcomes Short Term: Increase workloads from initial exercise prescription for resistance, speed, and METs.;Short Term: Perform resistance training exercises routinely during rehab and add in resistance training at home;Long Term: Improve cardiorespiratory fitness, muscular endurance and strength as measured by increased METs and functional capacity ( )       Able to understand and use rate of perceived exertion  (RPE) scale Yes       Intervention Provide education and explanation on how to use RPE scale       Expected Outcomes Short Term: Able to use RPE daily in rehab to express subjective intensity level;Long Term:  Able to use RPE to guide intensity level when exercising independently       Able to understand and use Dyspnea scale Yes       Intervention Provide education and explanation on how to use Dyspnea scale       Expected Outcomes Short Term: Able to use Dyspnea scale daily in rehab to express subjective sense of shortness of breath during exertion;Long Term: Able to use Dyspnea scale to guide intensity level when exercising independently       Knowledge and understanding of Target Heart Rate Range (THRR) Yes       Intervention Provide education and explanation of THRR including how the numbers were predicted and where they are located for reference       Expected Outcomes Short Term: Able to state/look up THRR;Short Term: Able to use daily as guideline for intensity in rehab;Long Term: Able to use THRR to govern intensity when exercising independently       Able to check pulse independently Yes       Intervention Provide education and demonstration on how to check pulse in carotid and radial arteries.;Review the importance of being able to check your own pulse for safety during independent exercise       Expected Outcomes Short Term: Able to explain why pulse checking is important during independent exercise;Long Term: Able to check pulse independently and accurately       Understanding of Exercise Prescription Yes       Intervention Provide education, explanation, and written materials on patient's individual exercise prescription       Expected Outcomes Short Term: Able to explain program exercise prescription;Long Term: Able to explain home exercise prescription to exercise independently                Exercise Goals Re-Evaluation :   Discharge Exercise Prescription (Final Exercise  Prescription Changes):  Exercise Prescription Changes - 01/04/24 1300       Response to Exercise   Blood Pressure (Admit) 108/70    Blood Pressure (Exercise) 146/68    Blood Pressure (Exit) 118/66    Heart Rate (Admit) 69 bpm    Heart Rate (Exercise) 90 bpm    Heart Rate (Exit) 69 bpm    Oxygen Saturation (Admit) 99 %    Oxygen Saturation (Exercise) 100 %    Oxygen Saturation (Exit) 100 %    Rating of Perceived Exertion (Exercise) 9    Perceived Dyspnea (Exercise) 0    Symptoms none    Comments results  Duration Progress to 30 minutes of  aerobic without signs/symptoms of physical distress    Intensity THRR New      Progression   Progression Continue to progress workloads to maintain intensity without signs/symptoms of physical distress.    Average METs 2.83             Nutrition:  Target Goals: Understanding of nutrition guidelines, daily intake of sodium 1500mg , cholesterol 200mg , calories 30% from fat and 7% or less from saturated fats, daily to have 5 or more servings of fruits and vegetables.  Education: All About Nutrition: -Group instruction provided by verbal, written material, interactive activities, discussions, models, and posters to present general guidelines for heart healthy nutrition including fat, fiber, MyPlate, the role of sodium in heart healthy nutrition, utilization of the nutrition label, and utilization of this knowledge for meal planning. Follow up email sent as well. Written material given at graduation.   Biometrics:  Pre Biometrics - 01/04/24 1357       Pre Biometrics   Height 5' 5.39" (1.661 m)    Weight 171 lb 6.4 oz (77.7 kg)    Waist Circumference 33 inches    Hip Circumference 43.5 inches    Waist to Hip Ratio 0.76 %    BMI (Calculated) 28.18    Single Leg Stand 30 seconds              Nutrition Therapy Plan and Nutrition Goals:  Nutrition Therapy & Goals - 01/04/24 1359       Personal Nutrition Goals   Nutrition  Goal Will meet with RD on 4/16      Intervention Plan   Intervention Prescribe, educate and counsel regarding individualized specific dietary modifications aiming towards targeted core components such as weight, hypertension, lipid management, diabetes, heart failure and other comorbidities.;Nutrition handout(s) given to patient.    Expected Outcomes Short Term Goal: Understand basic principles of dietary content, such as calories, fat, sodium, cholesterol and nutrients.;Short Term Goal: A plan has been developed with personal nutrition goals set during dietitian appointment.;Long Term Goal: Adherence to prescribed nutrition plan.             Nutrition Assessments:  MEDIFICTS Score Key: >=70 Need to make dietary changes  40-70 Heart Healthy Diet <= 40 Therapeutic Level Cholesterol Diet   Picture Your Plate Scores: <16 Unhealthy dietary pattern with much room for improvement. 41-50 Dietary pattern unlikely to meet recommendations for good health and room for improvement. 51-60 More healthful dietary pattern, with some room for improvement.  >60 Healthy dietary pattern, although there may be some specific behaviors that could be improved.    Nutrition Goals Re-Evaluation:   Nutrition Goals Discharge (Final Nutrition Goals Re-Evaluation):   Psychosocial: Target Goals: Acknowledge presence or absence of significant depression and/or stress, maximize coping skills, provide positive support system. Participant is able to verbalize types and ability to use techniques and skills needed for reducing stress and depression.   Education: Stress, Anxiety, and Depression - Group verbal and visual presentation to define topics covered.  Reviews how body is impacted by stress, anxiety, and depression.  Also discusses healthy ways to reduce stress and to treat/manage anxiety and depression.  Written material given at graduation.   Education: Sleep Hygiene -Provides group verbal and written  instruction about how sleep can affect your health.  Define sleep hygiene, discuss sleep cycles and impact of sleep habits. Review good sleep hygiene tips.    Initial Review & Psychosocial Screening:  Initial Psych Review &  Screening - 12/28/23 1617       Initial Review   Current issues with None Identified      Family Dynamics   Good Support System? Yes    Comments Jonika can look to her friends, significant other and daughter for support. She states no depression or anxiety and does not take any medications for her mood.      Barriers   Psychosocial barriers to participate in program There are no identifiable barriers or psychosocial needs.;The patient should benefit from training in stress management and relaxation.      Screening Interventions   Interventions Encouraged to exercise;Provide feedback about the scores to participant;To provide support and resources with identified psychosocial needs    Expected Outcomes Short Term goal: Utilizing psychosocial counselor, staff and physician to assist with identification of specific Stressors or current issues interfering with healing process. Setting desired goal for each stressor or current issue identified.;Long Term Goal: Stressors or current issues are controlled or eliminated.;Short Term goal: Identification and review with participant of any Quality of Life or Depression concerns found by scoring the questionnaire.;Long Term goal: The participant improves quality of Life and PHQ9 Scores as seen by post scores and/or verbalization of changes             Quality of Life Scores:   Scores of 19 and below usually indicate a poorer quality of life in these areas.  A difference of  2-3 points is a clinically meaningful difference.  A difference of 2-3 points in the total score of the Quality of Life Index has been associated with significant improvement in overall quality of life, self-image, physical symptoms, and general health in  studies assessing change in quality of life.  PHQ-9: Review Flowsheet       01/04/2024 04/22/2016  Depression screen PHQ 2/9  Decreased Interest 0 0  Down, Depressed, Hopeless 0 0  PHQ - 2 Score 0 0  Altered sleeping 1 0  Tired, decreased energy 0 1  Change in appetite 0 1  Feeling bad or failure about yourself  0 0  Trouble concentrating 0 0  Moving slowly or fidgety/restless 0 0  Suicidal thoughts 0 0  PHQ-9 Score 1 2  Difficult doing work/chores Not difficult at all Not difficult at all   Interpretation of Total Score  Total Score Depression Severity:  1-4 = Minimal depression, 5-9 = Mild depression, 10-14 = Moderate depression, 15-19 = Moderately severe depression, 20-27 = Severe depression   Psychosocial Evaluation and Intervention:  Psychosocial Evaluation - 12/28/23 1618       Psychosocial Evaluation & Interventions   Interventions Encouraged to exercise with the program and follow exercise prescription;Relaxation education;Stress management education    Comments Florella can look to her friends, significant other and daughter for support. She states no depression or anxiety and does not take any medications for her mood.    Expected Outcomes Short: Start HeartTrack to help with mood. Long: Maintain a healthy mental state    Continue Psychosocial Services  Follow up required by staff             Psychosocial Re-Evaluation:   Psychosocial Discharge (Final Psychosocial Re-Evaluation):   Vocational Rehabilitation: Provide vocational rehab assistance to qualifying candidates.   Vocational Rehab Evaluation & Intervention:   Education: Education Goals: Education classes will be provided on a variety of topics geared toward better understanding of heart health and risk factor modification. Participant will state understanding/return demonstration of topics presented as noted  by education test scores.  Learning Barriers/Preferences:  Learning Barriers/Preferences -  12/28/23 1614       Learning Barriers/Preferences   Learning Barriers None    Learning Preferences Group Instruction;Individual Instruction;Pictoral;Skilled Demonstration;Verbal Instruction;Video;Written Material             General Cardiac Education Topics:  AED/CPR: - Group verbal and written instruction with the use of models to demonstrate the basic use of the AED with the basic ABC's of resuscitation.   Anatomy and Cardiac Procedures: - Group verbal and visual presentation and models provide information about basic cardiac anatomy and function. Reviews the testing methods done to diagnose heart disease and the outcomes of the test results. Describes the treatment choices: Medical Management, Angioplasty, or Coronary Bypass Surgery for treating various heart conditions including Myocardial Infarction, Angina, Valve Disease, and Cardiac Arrhythmias.  Written material given at graduation.   Medication Safety: - Group verbal and visual instruction to review commonly prescribed medications for heart and lung disease. Reviews the medication, class of the drug, and side effects. Includes the steps to properly store meds and maintain the prescription regimen.  Written material given at graduation.   Intimacy: - Group verbal instruction through game format to discuss how heart and lung disease can affect sexual intimacy. Written material given at graduation..   Know Your Numbers and Heart Failure: - Group verbal and visual instruction to discuss disease risk factors for cardiac and pulmonary disease and treatment options.  Reviews associated critical values for Overweight/Obesity, Hypertension, Cholesterol, and Diabetes.  Discusses basics of heart failure: signs/symptoms and treatments.  Introduces Heart Failure Zone chart for action plan for heart failure.  Written material given at graduation.   Infection Prevention: - Provides verbal and written material to individual with  discussion of infection control including proper hand washing and proper equipment cleaning during exercise session. Flowsheet Row Cardiac Rehab from 01/04/2024 in [facility] Cardiac and Pulmonary Rehab  Date 01/04/24  Educator MB  Instruction Review Code 1- Verbalizes Understanding       Falls Prevention: - Provides verbal and written material to individual with discussion of falls prevention and safety. Flowsheet Row Cardiac Rehab from 01/04/2024 in [facility] Cardiac and Pulmonary Rehab  Date 01/04/24  Educator MB  Instruction Review Code 1- Verbalizes Understanding       Other: -Provides group and verbal instruction on various topics (see comments)   Knowledge Questionnaire Score:   Core Components/Risk Factors/Patient Goals at Admission:  Personal Goals and Risk Factors at Admission - 01/04/24 1359       Core Components/Risk Factors/Patient Goals on Admission    Weight Management Yes;Weight Loss    Intervention Weight Management: Develop a combined nutrition and exercise program designed to reach desired caloric intake, while maintaining appropriate intake of nutrient and fiber, sodium and fats, and appropriate energy expenditure required for the weight goal.;Weight Management: Provide education and appropriate resources to help participant work on and attain dietary goals.;Weight Management/Obesity: Establish reasonable short term and long term weight goals.    Admit Weight 171 lb 6.4 oz (77.7 kg)    Goal Weight: Short Term 158 lb 3.2 oz (71.8 kg)    Goal Weight: Long Term 145 lb (65.8 kg)    Expected Outcomes Short Term: Continue to assess and modify interventions until short term weight is achieved;Long Term: Adherence to nutrition and physical activity/exercise program aimed toward attainment of established weight goal;Understanding recommendations for meals to include 15-35% energy as protein, 25-35% energy from fat, 35-60% energy from  carbohydrates, less than 200mg  of dietary  cholesterol, 20-35 gm of total fiber daily;Understanding of distribution of calorie intake throughout the day with the consumption of 4-5 meals/snacks;Weight Loss: Understanding of general recommendations for a balanced deficit meal plan, which promotes 1-2 lb weight loss per week and includes a negative energy balance of [phone] kcal/d    Heart Failure Yes    Intervention Provide a combined exercise and nutrition program that is supplemented with education, support and counseling about heart failure. Directed toward relieving symptoms such as shortness of breath, decreased exercise tolerance, and extremity edema.    Expected Outcomes Improve functional capacity of life;Short term: Attendance in program 2-3 days a week with increased exercise capacity. Reported lower sodium intake. Reported increased fruit and vegetable intake. Reports medication compliance.;Short term: Daily weights obtained and reported for increase. Utilizing diuretic protocols set by physician.;Long term: Adoption of self-care skills and reduction of barriers for early signs and symptoms recognition and intervention leading to self-care maintenance.    Hypertension Yes    Intervention Provide education on lifestyle modifcations including regular physical activity/exercise, weight management, moderate sodium restriction and increased consumption of fresh fruit, vegetables, and low fat dairy, alcohol moderation, and smoking cessation.;Monitor prescription use compliance.    Expected Outcomes Short Term: Continued assessment and intervention until BP is < 140/29mm HG in hypertensive participants. < 130/80mm HG in hypertensive participants with diabetes, heart failure or chronic kidney disease.;Long Term: Maintenance of blood pressure at goal levels.    Lipids Yes    Intervention Provide education and support for participant on nutrition & aerobic/resistive exercise along with prescribed medications to achieve LDL 70mg , HDL >40mg .     Expected Outcomes Short Term: Participant states understanding of desired cholesterol values and is compliant with medications prescribed. Participant is following exercise prescription and nutrition guidelines.;Long Term: Cholesterol controlled with medications as prescribed, with individualized exercise RX and with personalized nutrition plan. Value goals: LDL < 70mg , HDL > 40 mg.             Education:Diabetes - Individual verbal and written instruction to review signs/symptoms of diabetes, desired ranges of glucose level fasting, after meals and with exercise. Acknowledge that pre and post exercise glucose checks will be done for 3 sessions at entry of program.   Core Components/Risk Factors/Patient Goals Review:    Core Components/Risk Factors/Patient Goals at Discharge (Final Review):    ITP Comments:  ITP Comments     Row Name 12/28/23 1620 01/04/24 1350         ITP Comments Virtual Visit completed. Patient informed on EP and RD appointment and 6 Minute walk test. Patient also informed of patient health questionnaires on My Chart. Patient Verbalizes understanding. Visit diagnosis can be found in [facility] 11/20/[facility]23. Completed and gym orientation for cardiac rehab. Initial ITP created and sent for review to Dr. Firman Hughes, Medical Director.               Comments: Initial ITP

## 2024-01-06 ENCOUNTER — Encounter

## 2024-01-06 ENCOUNTER — Encounter: Admitting: *Deleted

## 2024-01-06 DIAGNOSIS — Z955 Presence of coronary angioplasty implant and graft: Secondary | ICD-10-CM

## 2024-01-06 DIAGNOSIS — Z48812 Encounter for surgical aftercare following surgery on the circulatory system: Secondary | ICD-10-CM | POA: Diagnosis not present

## 2024-01-06 NOTE — Progress Notes (Signed)
 Assessment start time: 10:15 AM  Digestive issues/concerns: no known food allergies   24-hours Recall: B: half cup fruit loops, water Snack: toast with peanut butter L: salad, grilled chicken, fruit D: potato, veggie, chicken Snack: popcorn with butter OR cookie  Beverages water (48oz), lemonade, sweet tea, or pepsi (has been working on cutting down)  Education r/t nutrition plan Patient drinking mostly water but sometimes has a sweet beverage. She reports she has been working on cutting back on the sugary beverages. She reports eating healthy choices mostly, says sometimes she snacks at night on popcorn with butter or cookies. Sometimes she might cook eggs and bacon for breakfast on weekends but not every week. Encouraged her to enjoy comfort foods from time to time, but should not be regular or routine. Reviewed mediterranean diet handout. Educated on types of fats, sources, how to read labels. Provided guidelines on sodium and saturated fat, recommending she stay below 1500mg  sodium and 15gSat fat. Brainstormed some meal and snack ideas with foods she likes and will eat focusing on balanced plates with adequate protein and controlled sodium.    Goal 1: Read labels and reduce sodium intake to below 2300mg . Ideally 1500mg  per day.  Goal 2: Eat 15-30gProtein and 30-60gCarbs at each meal. Goal 3: Reduce saturated fat, less than 12g per day. Replace bad fats for more heart healthy fats.   End time 11:00 AM

## 2024-01-06 NOTE — Progress Notes (Signed)
 Daily Session Note  Patient Details  Name: Sara Hodges MRN: [id number] Date of Birth: Jul 08, 1951 Referring Provider:   Flowsheet Row Cardiac Rehab from 01/04/2024 in [facility] Cardiac and Pulmonary Rehab  Referring Provider Corbett Desanctis MD       Encounter Date: 01/06/2024  Check In:  Session Check In - 01/06/24 1044       Check-In   Supervising physician immediately available to respond to emergencies See telemetry face sheet for immediately available ER MD    Location [facility]-Cardiac & Pulmonary Rehab    Staff Present Lyell Samuel, MS, Exercise Physiologist;Maxon Conetta BS, Exercise Physiologist;Jason Martina Sledge RDN,LDN;Wrigley Winborne, RN, BSN, CCRP    Virtual Visit No    Medication changes reported     No    Fall or balance concerns reported    No    Warm-up and Cool-down Performed on first and last piece of equipment    VAD Patient? No    PAD/SET Patient? No      Pain Assessment   Currently in Pain? No/denies                Social History   Tobacco Use  Smoking Status Never  Smokeless Tobacco Never    Goals Met:  Exercise tolerated well Personal goals reviewed No report of concerns or symptoms today  Goals Unmet:  Not Applicable  Comments: First full day of exercise!  Patient was oriented to gym and equipment including functions, settings, policies, and procedures.  Patient's individual exercise prescription and treatment plan were reviewed.  All starting workloads were established based on the results of the 6 minute walk test done at initial orientation visit.  The plan for exercise progression was also introduced and progression will be customized based on patient's performance and goals. Pt able to follow exercise prescription today without complaint.  Will continue to monitor for progression.    Dr. Firman Hughes is Medical Director for [facility] Cardiac Rehabilitation.  Dr. Fuad Aleskerov is Medical Director for [facility] Pulmonary Rehabilitation.

## 2024-01-07 ENCOUNTER — Encounter

## 2024-01-13 ENCOUNTER — Encounter

## 2024-01-13 DIAGNOSIS — Z48812 Encounter for surgical aftercare following surgery on the circulatory system: Secondary | ICD-10-CM | POA: Diagnosis not present

## 2024-01-13 DIAGNOSIS — Z955 Presence of coronary angioplasty implant and graft: Secondary | ICD-10-CM

## 2024-01-13 NOTE — Progress Notes (Signed)
 Daily Session Note  Patient Details  Name: Sara Hodges MRN: [id number] Date of Birth: 03/02/1951 Referring Provider:   Flowsheet Row Cardiac Rehab from 01/04/2024 in [facility] Cardiac and Pulmonary Rehab  Referring Provider Corbett Desanctis MD       Encounter Date: 01/13/2024  Check In:  Session Check In - 01/13/24 0936       Check-In   Supervising physician immediately available to respond to emergencies See telemetry face sheet for immediately available ER MD    Location [facility]-Cardiac & Pulmonary Rehab    Staff Present Lyell Samuel, MS, Exercise Physiologist;Maxon Conetta BS, Exercise Physiologist;Adan Beal, RN, BSN, CCRP;Joseph Hood RCP,RRT,BSRT    Virtual Visit No    Medication changes reported     No    Fall or balance concerns reported    No    Warm-up and Cool-down Performed on first and last piece of equipment    Resistance Training Performed Yes    VAD Patient? No    PAD/SET Patient? No      Pain Assessment   Currently in Pain? No/denies                Social History   Tobacco Use  Smoking Status Never  Smokeless Tobacco Never    Goals Met:  Independence with exercise equipment Exercise tolerated well No report of concerns or symptoms today  Goals Unmet:  Not Applicable  Comments: Pt able to follow exercise prescription today without complaint.  Will continue to monitor for progression.    Dr. Firman Hughes is Medical Director for [facility] Cardiac Rehabilitation.  Dr. Fuad Aleskerov is Medical Director for [facility] Pulmonary Rehabilitation.

## 2024-01-14 ENCOUNTER — Encounter

## 2024-01-14 DIAGNOSIS — Z48812 Encounter for surgical aftercare following surgery on the circulatory system: Secondary | ICD-10-CM | POA: Diagnosis not present

## 2024-01-14 DIAGNOSIS — Z955 Presence of coronary angioplasty implant and graft: Secondary | ICD-10-CM

## 2024-01-14 NOTE — Progress Notes (Signed)
 Daily Session Note  Patient Details  Name: Sara Hodges MRN: [id number] Date of Birth: 1951-07-24 Referring Provider:   Flowsheet Row Cardiac Rehab from 01/04/2024 in [facility] Cardiac and Pulmonary Rehab  Referring Provider Corbett Desanctis MD       Encounter Date: 01/14/2024  Check In:  Session Check In - 01/14/24 1733       Check-In   Supervising physician immediately available to respond to emergencies See telemetry face sheet for immediately available ER MD    Location [facility]-Cardiac & Pulmonary Rehab    Staff Present Sue Em RN,BSN;Joseph [facility] BS, Exercise Physiologist    Virtual Visit No    Medication changes reported     No    Fall or balance concerns reported    No    Warm-up and Cool-down Performed on first and last piece of equipment    Resistance Training Performed Yes    VAD Patient? No    PAD/SET Patient? No      Pain Assessment   Currently in Pain? No/denies                Social History   Tobacco Use  Smoking Status Never  Smokeless Tobacco Never    Goals Met:  Independence with exercise equipment Exercise tolerated well No report of concerns or symptoms today Strength training completed today  Goals Unmet:  Not Applicable  Comments: Pt able to follow exercise prescription today without complaint.  Will continue to monitor for progression.    Dr. Firman Hughes is Medical Director for [facility] Cardiac Rehabilitation.  Dr. Fuad Aleskerov is Medical Director for [facility] Pulmonary Rehabilitation.

## 2024-01-20 ENCOUNTER — Encounter: Admitting: *Deleted

## 2024-01-20 DIAGNOSIS — Z955 Presence of coronary angioplasty implant and graft: Secondary | ICD-10-CM

## 2024-01-20 DIAGNOSIS — Z48812 Encounter for surgical aftercare following surgery on the circulatory system: Secondary | ICD-10-CM | POA: Diagnosis not present

## 2024-01-20 NOTE — Progress Notes (Signed)
 Daily Session Note  Patient Details  Name: Sara Hodges MRN: [id number] Date of Birth: 02/05/1951 Referring Provider:   Flowsheet Row Cardiac Rehab from 01/04/2024 in [facility] Cardiac and Pulmonary Rehab  Referring Provider Corbett Desanctis MD       Encounter Date: 01/20/2024  Check In:  Session Check In - 01/20/24 0937       Check-In   Supervising physician immediately available to respond to emergencies See telemetry face sheet for immediately available ER MD    Location [facility]-Cardiac & Pulmonary Rehab    Staff Present Maud Sorenson, RN, BSN, CCRP;Joseph Hood RCP,RRT,BSRT;Maxon Dayton BS, Exercise Physiologist;Noah Tickle, BS, Exercise Physiologist    Virtual Visit No    Medication changes reported     No    Fall or balance concerns reported    No    Warm-up and Cool-down Performed on first and last piece of equipment    Resistance Training Performed Yes    VAD Patient? No    PAD/SET Patient? No      Pain Assessment   Currently in Pain? No/denies                Social History   Tobacco Use  Smoking Status Never  Smokeless Tobacco Never    Goals Met:  Independence with exercise equipment Exercise tolerated well No report of concerns or symptoms today  Goals Unmet:  Not Applicable  Comments: Pt able to follow exercise prescription today without complaint.  Will continue to monitor for progression.    Dr. Firman Hughes is Medical Director for [facility] Cardiac Rehabilitation.  Dr. Fuad Aleskerov is Medical Director for [facility] Pulmonary Rehabilitation.

## 2024-01-21 ENCOUNTER — Encounter: Attending: Cardiovascular Disease | Admitting: *Deleted

## 2024-01-21 DIAGNOSIS — Z955 Presence of coronary angioplasty implant and graft: Secondary | ICD-10-CM | POA: Insufficient documentation

## 2024-01-21 NOTE — Progress Notes (Signed)
 Daily Session Note  Patient Details  Name: Sara Hodges MRN: [id number] Date of Birth: [birth year] Referring Provider:   Flowsheet Row Cardiac Rehab from 01/04/2024 in [facility] Cardiac and Pulmonary Rehab  Referring Provider Corbett Desanctis MD       Encounter Date: 01/21/2024  Check In:  Session Check In - 01/21/24 1719       Check-In   Supervising physician immediately available to respond to emergencies See telemetry face sheet for immediately available ER MD    Location [facility]-Cardiac & Pulmonary Rehab    Staff Present Sue Em RN,BSN;Joseph [facility] BS, Exercise Physiologist    Virtual Visit No    Medication changes reported     No    Fall or balance concerns reported    No    Warm-up and Cool-down Performed on first and last piece of equipment    Resistance Training Performed Yes    VAD Patient? No    PAD/SET Patient? No      Pain Assessment   Currently in Pain? No/denies                Social History   Tobacco Use  Smoking Status Never  Smokeless Tobacco Never    Goals Met:  Independence with exercise equipment Exercise tolerated well No report of concerns or symptoms today Strength training completed today  Goals Unmet:  Not Applicable  Comments: Pt able to follow exercise prescription today without complaint.  Will continue to monitor for progression.    Dr. Firman Hughes is Medical Director for [facility] Cardiac Rehabilitation.  Dr. Fuad Aleskerov is Medical Director for [facility] Pulmonary Rehabilitation.

## 2024-01-27 ENCOUNTER — Encounter: Admitting: *Deleted

## 2024-01-27 DIAGNOSIS — Z955 Presence of coronary angioplasty implant and graft: Secondary | ICD-10-CM | POA: Diagnosis not present

## 2024-01-27 NOTE — Progress Notes (Signed)
 Daily Session Note  Patient Details  Name: Sara Hodges MRN: [id number] Date of Birth: March 04, 1951 Referring Provider:   Flowsheet Row Cardiac Rehab from 01/04/2024 in [facility] Cardiac and Pulmonary Rehab  Referring Provider Corbett Desanctis MD       Encounter Date: 01/27/2024  Check In:  Session Check In - 01/27/24 0944       Check-In   Supervising physician immediately available to respond to emergencies See telemetry face sheet for immediately available ER MD    Location [facility]-Cardiac & Pulmonary Rehab    Staff Present Maxon Conetta BS, Exercise Physiologist;Laureen Bevin Bucks, BS, RRT, CPFT;Liberty Stead, RN, BSN, CCRP;Meredith Manson Seitz RN,BSN;[employer]    Virtual Visit No    Medication changes reported     No    Fall or balance concerns reported    No    Warm-up and Cool-down Performed on first and last piece of equipment    Resistance Training Performed Yes    VAD Patient? No    PAD/SET Patient? No      Pain Assessment   Currently in Pain? No/denies                Social History   Tobacco Use  Smoking Status Never  Smokeless Tobacco Never    Goals Met:  Independence with exercise equipment Exercise tolerated well No report of concerns or symptoms today  Goals Unmet:  Not Applicable  Comments: Pt able to follow exercise prescription today without complaint.  Will continue to monitor for progression.    Dr. Firman Hughes is Medical Director for [facility] Cardiac Rehabilitation.  Dr. Fuad Aleskerov is Medical Director for [facility] Pulmonary Rehabilitation.

## 2024-01-27 NOTE — Progress Notes (Signed)
 30 Day review completed. Medical Director ITP review done, changes made as directed, and signed approval by Medical Director. New to program.

## 2024-01-27 NOTE — Progress Notes (Signed)
 Cardiac Individual Treatment Plan  Patient Details  Name: Sara Hodges MRN: [id number] Date of Birth: 19-Mar-1951 Referring Provider:   Flowsheet Row Cardiac Rehab from 01/04/2024 in [facility] Cardiac and Pulmonary Rehab  Referring Provider Corbett Desanctis MD       Initial Encounter Date:  Flowsheet Row Cardiac Rehab from 01/04/2024 in [facility] Cardiac and Pulmonary Rehab  Date 01/04/24       Visit Diagnosis: Status post coronary artery stent placement  Patient's Home Medications on Admission:  Current Outpatient Medications:    acetaminophen  (TYLENOL ) 500 MG tablet, Take by mouth., Disp: , Rfl:    aspirin  EC 81 MG tablet, Take 81 mg by mouth daily. , Disp: , Rfl:    celecoxib (CELEBREX) 200 MG capsule, , Disp: , Rfl:    clopidogrel (PLAVIX) 75 MG tablet, Take 75 mg by mouth daily., Disp: , Rfl:    ENTRESTO  49-51 MG, Take 1 tablet by mouth 2 (two) times daily. (Patient not taking: Reported on 12/28/2023), Disp: , Rfl:    ENTRESTO  97-103 MG, Take 1 tablet by mouth 2 (two) times daily., Disp: , Rfl:    folic acid  (FOLVITE ) 1 MG tablet, Take 1 mg by mouth daily. (Patient not taking: Reported on 12/28/2023), Disp: , Rfl:    folic acid  (FOLVITE ) 1 MG tablet, Take by mouth., Disp: , Rfl:    furosemide  (LASIX ) 20 MG tablet, Take 20 mg by mouth daily as needed for fluid., Disp: , Rfl:    levocetirizine (XYZAL) 5 MG tablet, Take 1 tablet by mouth every evening., Disp: , Rfl:    metoprolol succinate (TOPROL-XL) 25 MG 24 hr tablet, , Disp: , Rfl:    metoprolol succinate (TOPROL-XL) 25 MG 24 hr tablet, Take by mouth., Disp: , Rfl:    nitroGLYCERIN (NITROSTAT) 0.4 MG SL tablet, Place under the tongue., Disp: , Rfl:    pantoprazole (PROTONIX) 40 MG tablet, Take 40 mg by mouth daily., Disp: , Rfl:    polyethylene glycol powder (GLYCOLAX/MIRALAX) 17 GM/SCOOP powder, Take by mouth. (Patient not taking: Reported on 12/28/2023), Disp: , Rfl:    predniSONE (DELTASONE) 10 MG tablet, 2 tabs daily for 4 days, 1 tab  daily for 4 days, Disp: , Rfl:    spironolactone  (ALDACTONE ) 25 MG tablet, Take 25 mg by mouth daily. (Patient not taking: Reported on 12/28/2023), Disp: , Rfl:    triamcinolone ointment (KENALOG) 0.1 %, Apply twice daily to raised itchy areas until smooth (typically 5-10 days) (Patient not taking: Reported on 12/28/2023), Disp: , Rfl:    Vitamin D , Ergocalciferol , (DRISDOL ) 50000 UNITS CAPS capsule, Take 50,000 Units by mouth every 7 (seven) days. Takes on Sunday, Disp: , Rfl:   Past Medical History: Past Medical History:  Diagnosis Date   CAD (coronary artery disease)    CHF (congestive heart failure) (HCC)    HLD (hyperlipidemia)    Hypertension    Pancreatic cyst    Pancreatic mass     Tobacco Use: Social History   Tobacco Use  Smoking Status Never  Smokeless Tobacco Never    Labs: Review Flowsheet        No data to display           Exercise Target Goals: Exercise Program Goal: Individual exercise prescription set using results from initial 6 min walk test and THRR while considering  patient's activity barriers and safety.   Exercise Prescription Goal: Initial exercise prescription builds to 30-45 minutes a day of aerobic activity, 2-3 days per week.  Home exercise guidelines will be given to patient during program as part of exercise prescription that the participant will acknowledge.   Education: Aerobic Exercise: - Group verbal and visual presentation on the components of exercise prescription. Introduces F.I.T.T principle from ACSM for exercise prescriptions.  Reviews F.I.T.T. principles of aerobic exercise including progression. Written material given at graduation.   Education: Resistance Exercise: - Group verbal and visual presentation on the components of exercise prescription. Introduces F.I.T.T principle from ACSM for exercise prescriptions  Reviews F.I.T.T. principles of resistance exercise including progression. Written material given at graduation.     Education: Exercise & Equipment Safety: - Individual verbal instruction and demonstration of equipment use and safety with use of the equipment. Flowsheet Row Cardiac Rehab from 01/27/2024 in [facility] Cardiac and Pulmonary Rehab  Date 01/04/24  Educator MB  Instruction Review Code 1- Verbalizes Understanding       Education: Exercise Physiology & General Exercise Guidelines: - Group verbal and written instruction with models to review the exercise physiology of the cardiovascular system and associated critical values. Provides general exercise guidelines with specific guidelines to those with heart or lung disease.    Education: Flexibility, Balance, Mind/Body Relaxation: - Group verbal and visual presentation with interactive activity on the components of exercise prescription. Introduces F.I.T.T principle from ACSM for exercise prescriptions. Reviews F.I.T.T. principles of flexibility and balance exercise training including progression. Also discusses the mind body connection.  Reviews various relaxation techniques to help reduce and manage stress (i.e. Deep breathing, progressive muscle relaxation, and visualization). Balance handout provided to take home. Written material given at graduation.   Activity Barriers & Risk Stratification:  Activity Barriers & Cardiac Risk Stratification - 01/04/24 1352       Activity Barriers & Cardiac Risk Stratification   Activity Barriers Back Problems;Other (comment)    Comments L leg aches (mainly at night), occasional low and mid back pain due to degenerative disc disease    Cardiac Risk Stratification Moderate             6 Minute Walk:  6 Minute Walk     Row Name 01/04/24 1350         6 Minute Walk   Phase Initial     Distance 1390 feet     Walk Time 6 minutes     # of Rest Breaks 0     MPH 2.63     METS 2.83     RPE 9     Perceived Dyspnea  0     VO2 Peak 9.89     Symptoms No     Resting HR 69 bpm     Resting BP 108/70      Resting Oxygen Saturation  99 %     Exercise Oxygen Saturation  during 6 min walk 100 %     Max Ex. HR 90 bpm     Max Ex. BP 146/68     2 Minute Post BP 118/66              Oxygen Initial Assessment:   Oxygen Re-Evaluation:   Oxygen Discharge (Final Oxygen Re-Evaluation):   Initial Exercise Prescription:  Initial Exercise Prescription - 01/04/24 1300       Date of Initial Exercise RX and Referring Provider   Date 01/04/24    Referring Provider Corbett Desanctis MD      Oxygen   Maintain Oxygen Saturation 88% or higher      Recumbant Bike   Level 2  RPM 50    Watts 20    Minutes 15    METs 2.83      NuStep   Level 2    SPM 80    Minutes 15    METs 2.83      REL-XR   Level 1    Speed 50    Minutes 15    METs 2.83      T5 Nustep   Level 2    SPM 80    Minutes 15    METs 2.83      Track   Laps 33    Minutes 15    METs 2.79      Prescription Details   Frequency (times per week) 2    Duration Progress to 30 minutes of continuous aerobic without signs/symptoms of physical distress      Intensity   THRR 40-80% of Max Heartrate 100-131    Ratings of Perceived Exertion 11-13    Perceived Dyspnea 0-4      Progression   Progression Continue to progress workloads to maintain intensity without signs/symptoms of physical distress.      Resistance Training   Training Prescription Yes    Weight 4 lb    Reps 10-15             Perform Capillary Blood Glucose checks as needed.  Exercise Prescription Changes:   Exercise Prescription Changes     Row Name 01/04/24 1300 01/14/24 1800 01/26/24 1500         Response to Exercise   Blood Pressure (Admit) 108/70 102/58 102/60     Blood Pressure (Exercise) 146/68 130/60 148/62     Blood Pressure (Exit) 118/66 108/60 110/62     Heart Rate (Admit) 69 bpm 82 bpm 77 bpm     Heart Rate (Exercise) 90 bpm 90 bpm 116 bpm     Heart Rate (Exit) 69 bpm 74 bpm 93 bpm     Oxygen Saturation (Admit) 99 % --  --     Oxygen Saturation (Exercise) 100 % -- --     Oxygen Saturation (Exit) 100 % -- --     Rating of Perceived Exertion (Exercise) 9 11 13      Perceived Dyspnea (Exercise) 0 -- --     Symptoms none none none     Comments results 1st full day of exercise --     Duration Progress to 30 minutes of  aerobic without signs/symptoms of physical distress Continue with 30 min of aerobic exercise without signs/symptoms of physical distress. Continue with 30 min of aerobic exercise without signs/symptoms of physical distress.     Intensity THRR New THRR unchanged THRR unchanged       Progression   Progression Continue to progress workloads to maintain intensity without signs/symptoms of physical distress. Continue to progress workloads to maintain intensity without signs/symptoms of physical distress. Continue to progress workloads to maintain intensity without signs/symptoms of physical distress.     Average METs 2.83 3.38 3.07       Resistance Training   Training Prescription -- Yes Yes     Weight -- 4 lb 5 lb     Reps -- 10-15 10-15       Interval Training   Interval Training -- No No       Treadmill   MPH -- -- 2.5     Grade -- -- 0     Minutes -- -- 15     METs -- --  2.91       Recumbant Bike   Level -- -- 5     Watts -- -- 39     Minutes -- -- 15     METs -- -- 2.81       NuStep   Level -- 2 4     Minutes -- 15 15     METs -- 5 4       REL-XR   Level -- -- 6     Minutes -- -- 15     METs -- -- 4.5       T5 Nustep   Level -- -- 3     Minutes -- -- 15     METs -- -- 2.2       Track   Laps -- 14 14     Minutes -- 15 15     METs -- 1.76 1.76       Oxygen   Maintain Oxygen Saturation -- 88% or higher 88% or higher              Exercise Comments:   Exercise Comments     Row Name 01/06/24 1045           Exercise Comments First full day of exercise!  Patient was oriented to gym and equipment including functions, settings, policies, and procedures.   Patient's individual exercise prescription and treatment plan were reviewed.  All starting workloads were established based on the results of the 6 minute walk test done at initial orientation visit.  The plan for exercise progression was also introduced and progression will be customized based on patient's performance and goals.                Exercise Goals and Review:   Exercise Goals     Row Name 01/04/24 1356             Exercise Goals   Increase Physical Activity Yes       Intervention Provide advice, education, support and counseling about physical activity/exercise needs.;Develop an individualized exercise prescription for aerobic and resistive training based on initial evaluation findings, risk stratification, comorbidities and participant's personal goals.       Expected Outcomes Short Term: Attend rehab on a regular basis to increase amount of physical activity.;Long Term: Add in home exercise to make exercise part of routine and to increase amount of physical activity.;Long Term: Exercising regularly at least 3-5 days a week.       Increase Strength and Stamina Yes       Intervention Provide advice, education, support and counseling about physical activity/exercise needs.;Develop an individualized exercise prescription for aerobic and resistive training based on initial evaluation findings, risk stratification, comorbidities and participant's personal goals.       Expected Outcomes Short Term: Increase workloads from initial exercise prescription for resistance, speed, and METs.;Short Term: Perform resistance training exercises routinely during rehab and add in resistance training at home;Long Term: Improve cardiorespiratory fitness, muscular endurance and strength as measured by increased METs and functional capacity ( )       Able to understand and use rate of perceived exertion (RPE) scale Yes       Intervention Provide education and explanation on how to use RPE scale        Expected Outcomes Short Term: Able to use RPE daily in rehab to express subjective intensity level;Long Term:  Able to use RPE to guide intensity level when exercising independently  Able to understand and use Dyspnea scale Yes       Intervention Provide education and explanation on how to use Dyspnea scale       Expected Outcomes Short Term: Able to use Dyspnea scale daily in rehab to express subjective sense of shortness of breath during exertion;Long Term: Able to use Dyspnea scale to guide intensity level when exercising independently       Knowledge and understanding of Target Heart Rate Range (THRR) Yes       Intervention Provide education and explanation of THRR including how the numbers were predicted and where they are located for reference       Expected Outcomes Short Term: Able to state/look up THRR;Short Term: Able to use daily as guideline for intensity in rehab;Long Term: Able to use THRR to govern intensity when exercising independently       Able to check pulse independently Yes       Intervention Provide education and demonstration on how to check pulse in carotid and radial arteries.;Review the importance of being able to check your own pulse for safety during independent exercise       Expected Outcomes Short Term: Able to explain why pulse checking is important during independent exercise;Long Term: Able to check pulse independently and accurately       Understanding of Exercise Prescription Yes       Intervention Provide education, explanation, and written materials on patient's individual exercise prescription       Expected Outcomes Short Term: Able to explain program exercise prescription;Long Term: Able to explain home exercise prescription to exercise independently                Exercise Goals Re-Evaluation :  Exercise Goals Re-Evaluation     Row Name 01/06/24 1045 01/14/24 1805 01/26/24 1527         Exercise Goal Re-Evaluation   Exercise Goals Review  Able to understand and use rate of perceived exertion (RPE) scale;Knowledge and understanding of Target Heart Rate Range (THRR);Understanding of Exercise Prescription;Able to understand and use Dyspnea scale Increase Physical Activity;Understanding of Exercise Prescription;Increase Strength and Stamina Increase Physical Activity;Understanding of Exercise Prescription;Increase Strength and Stamina     Comments Reviewed RPE and dyspnea scale, THR and program prescription with pt today.  Pt voiced understanding and was given a copy of goals to take home. Veronique completed her first exercise session in this review. She was able to do level 2 on the T4 nustep and 14 laps on the track. We will continue to monitor her progress in the program. Lenora is doing well in the rehab. She improved to level 3 on the T5 nustep, level 4 on the T4 nustep, level 5 on the recumbent bike, and level 6 on the XR. She also began using the treadmill at a speed of 2.5 mph with no incline. We will continue to monitor her progress in the program.     Expected Outcomes Short: Use RPE daily to regulate intensity. Long: Follow program prescription in THR. Short: Continue to follow current exercise prescription. Long: Continue exercise to improve strength and stamina. Short: Continue to progressively increase treadmill workload. Long: Continue exercise to improve strength and stamina.              Discharge Exercise Prescription (Final Exercise Prescription Changes):  Exercise Prescription Changes - 01/26/24 1500       Response to Exercise   Blood Pressure (Admit) 102/60    Blood Pressure (Exercise) 148/62  Blood Pressure (Exit) 110/62    Heart Rate (Admit) 77 bpm    Heart Rate (Exercise) 116 bpm    Heart Rate (Exit) 93 bpm    Rating of Perceived Exertion (Exercise) 13    Symptoms none    Duration Continue with 30 min of aerobic exercise without signs/symptoms of physical distress.    Intensity THRR unchanged       Progression   Progression Continue to progress workloads to maintain intensity without signs/symptoms of physical distress.    Average METs 3.07      Resistance Training   Training Prescription Yes    Weight 5 lb    Reps 10-15      Interval Training   Interval Training No      Treadmill   MPH 2.5    Grade 0    Minutes 15    METs 2.91      Recumbant Bike   Level 5    Watts 39    Minutes 15    METs 2.81      NuStep   Level 4    Minutes 15    METs 4      REL-XR   Level 6    Minutes 15    METs 4.5      T5 Nustep   Level 3    Minutes 15    METs 2.2      Track   Laps 14    Minutes 15    METs 1.76      Oxygen   Maintain Oxygen Saturation 88% or higher             Nutrition:  Target Goals: Understanding of nutrition guidelines, daily intake of sodium 1500mg , cholesterol 200mg , calories 30% from fat and 7% or less from saturated fats, daily to have 5 or more servings of fruits and vegetables.  Education: All About Nutrition: -Group instruction provided by verbal, written material, interactive activities, discussions, models, and posters to present general guidelines for heart healthy nutrition including fat, fiber, MyPlate, the role of sodium in heart healthy nutrition, utilization of the nutrition label, and utilization of this knowledge for meal planning. Follow up email sent as well. Written material given at graduation.   Biometrics:  Pre Biometrics - 01/04/24 1357       Pre Biometrics   Height 5' 5.39" (1.661 m)    Weight 171 lb 6.4 oz (77.7 kg)    Waist Circumference 33 inches    Hip Circumference 43.5 inches    Waist to Hip Ratio 0.76 %    BMI (Calculated) 28.18    Single Leg Stand 30 seconds              Nutrition Therapy Plan and Nutrition Goals:  Nutrition Therapy & Goals - 01/06/24 1115       Nutrition Therapy   Diet Cardiac, Low Na    Protein (specify units) 80g    Fiber 25 grams    Whole Grain Foods 3 servings    Saturated  Fats 15 max. grams    Fruits and Vegetables 5 servings/day    Sodium 2 grams      Personal Nutrition Goals   Nutrition Goal Read labels and reduce sodium intake to below 2300mg . Ideally 1500mg  per day.    Personal Goal #2 Eat 15-30gProtein and 30-60gCarbs at each meal.    Personal Goal #3 Reduce saturated fat, less than 12g per day. Replace bad fats for more heart healthy fats.  Comments Patient drinking mostly water but sometimes has a sweet beverage. She reports she has been working on cutting back on the sugary beverages. She reports eating healthy choices mostly, says sometimes she snacks at night on popcorn with butter or cookies. Sometimes she might cook eggs and bacon for breakfast on weekends but not every week. Encouraged her to enjoy comfort foods from time to time, but should not be regular or routine. Reviewed mediterranean diet handout. Educated on types of fats, sources, how to read labels. Provided guidelines on sodium and saturated fat, recommending she stay below 1500mg  sodium and 15gSat fat. Brainstormed some meal and snack ideas with foods she likes and will eat focusing on balanced plates with adequate protein and controlled sodium.      Intervention Plan   Intervention Prescribe, educate and counsel regarding individualized specific dietary modifications aiming towards targeted core components such as weight, hypertension, lipid management, diabetes, heart failure and other comorbidities.;Nutrition handout(s) given to patient.    Expected Outcomes Short Term Goal: Understand basic principles of dietary content, such as calories, fat, sodium, cholesterol and nutrients.;Short Term Goal: A plan has been developed with personal nutrition goals set during dietitian appointment.;Long Term Goal: Adherence to prescribed nutrition plan.             Nutrition Assessments:  MEDIFICTS Score Key: >=70 Need to make dietary changes  40-70 Heart Healthy Diet <= 40 Therapeutic Level  Cholesterol Diet  Flowsheet Row Cardiac Rehab from 01/13/2024 in [facility] Cardiac and Pulmonary Rehab  Picture Your Plate Total Score on Admission 61      Picture Your Plate Scores: <65 Unhealthy dietary pattern with much room for improvement. 41-50 Dietary pattern unlikely to meet recommendations for good health and room for improvement. 51-60 More healthful dietary pattern, with some room for improvement.  >60 Healthy dietary pattern, although there may be some specific behaviors that could be improved.    Nutrition Goals Re-Evaluation:   Nutrition Goals Discharge (Final Nutrition Goals Re-Evaluation):   Psychosocial: Target Goals: Acknowledge presence or absence of significant depression and/or stress, maximize coping skills, provide positive support system. Participant is able to verbalize types and ability to use techniques and skills needed for reducing stress and depression.   Education: Stress, Anxiety, and Depression - Group verbal and visual presentation to define topics covered.  Reviews how body is impacted by stress, anxiety, and depression.  Also discusses healthy ways to reduce stress and to treat/manage anxiety and depression.  Written material given at graduation. Flowsheet Row Cardiac Rehab from 01/27/2024 in [facility] Cardiac and Pulmonary Rehab  Date 01/27/24  Educator [facility]  Instruction Review Code 1- [employer] Understanding       Education: Sleep Hygiene -Provides group verbal and written instruction about how sleep can affect your health.  Define sleep hygiene, discuss sleep cycles and impact of sleep habits. Review good sleep hygiene tips.    Initial Review & Psychosocial Screening:  Initial Psych Review & Screening - 12/28/23 1617       Initial Review   Current issues with None Identified      Family Dynamics   Good Support System? Yes    Comments Shyleen can look to her friends, significant other and daughter for support. She states no depression or anxiety and  does not take any medications for her mood.      Barriers   Psychosocial barriers to participate in program There are no identifiable barriers or psychosocial needs.;The patient should benefit from training in stress  management and relaxation.      Screening Interventions   Interventions Encouraged to exercise;Provide feedback about the scores to participant;To provide support and resources with identified psychosocial needs    Expected Outcomes Short Term goal: Utilizing psychosocial counselor, staff and physician to assist with identification of specific Stressors or current issues interfering with healing process. Setting desired goal for each stressor or current issue identified.;Long Term Goal: Stressors or current issues are controlled or eliminated.;Short Term goal: Identification and review with participant of any Quality of Life or Depression concerns found by scoring the questionnaire.;Long Term goal: The participant improves quality of Life and PHQ9 Scores as seen by post scores and/or verbalization of changes             Quality of Life Scores:   Quality of Life - 01/13/24 1045       Quality of Life   Select Quality of Life      Quality of Life Scores   Health/Function Pre 25.86 %    Socioeconomic Pre 22.56 %    Psych/Spiritual Pre 25.75 %    Family Pre 24.9 %    GLOBAL Pre 24.89 %            Scores of 19 and below usually indicate a poorer quality of life in these areas.  A difference of  2-3 points is a clinically meaningful difference.  A difference of 2-3 points in the total score of the Quality of Life Index has been associated with significant improvement in overall quality of life, self-image, physical symptoms, and general health in studies assessing change in quality of life.  PHQ-9: Review Flowsheet       01/04/2024 04/22/2016  Depression screen PHQ 2/9  Decreased Interest 0 0  Down, Depressed, Hopeless 0 0  PHQ - 2 Score 0 0  Altered sleeping 1 0   Tired, decreased energy 0 1  Change in appetite 0 1  Feeling bad or failure about yourself  0 0  Trouble concentrating 0 0  Moving slowly or fidgety/restless 0 0  Suicidal thoughts 0 0  PHQ-9 Score 1 2  Difficult doing work/chores Not difficult at all Not difficult at all   Interpretation of Total Score  Total Score Depression Severity:  1-4 = Minimal depression, 5-9 = Mild depression, 10-14 = Moderate depression, 15-19 = Moderately severe depression, 20-27 = Severe depression   Psychosocial Evaluation and Intervention:  Psychosocial Evaluation - 12/28/23 1618       Psychosocial Evaluation & Interventions   Interventions Encouraged to exercise with the program and follow exercise prescription;Relaxation education;Stress management education    Comments Braiden can look to her friends, significant other and daughter for support. She states no depression or anxiety and does not take any medications for her mood.    Expected Outcomes Short: Start HeartTrack to help with mood. Long: Maintain a healthy mental state    Continue Psychosocial Services  Follow up required by staff             Psychosocial Re-Evaluation:   Psychosocial Discharge (Final Psychosocial Re-Evaluation):   Vocational Rehabilitation: Provide vocational rehab assistance to qualifying candidates.   Vocational Rehab Evaluation & Intervention:   Education: Education Goals: Education classes will be provided on a variety of topics geared toward better understanding of heart health and risk factor modification. Participant will state understanding/return demonstration of topics presented as noted by education test scores.  Learning Barriers/Preferences:  Learning Barriers/Preferences - 12/28/23 1614  Learning Barriers/Preferences   Learning Barriers None    Learning Preferences Group Instruction;Individual Instruction;Pictoral;Skilled Demonstration;Verbal Instruction;Video;Written Material              General Cardiac Education Topics:  AED/CPR: - Group verbal and written instruction with the use of models to demonstrate the basic use of the AED with the basic ABC's of resuscitation.   Anatomy and Cardiac Procedures: - Group verbal and visual presentation and models provide information about basic cardiac anatomy and function. Reviews the testing methods done to diagnose heart disease and the outcomes of the test results. Describes the treatment choices: Medical Management, Angioplasty, or Coronary Bypass Surgery for treating various heart conditions including Myocardial Infarction, Angina, Valve Disease, and Cardiac Arrhythmias.  Written material given at graduation.   Medication Safety: - Group verbal and visual instruction to review commonly prescribed medications for heart and lung disease. Reviews the medication, class of the drug, and side effects. Includes the steps to properly store meds and maintain the prescription regimen.  Written material given at graduation.   Intimacy: - Group verbal instruction through game format to discuss how heart and lung disease can affect sexual intimacy. Written material given at graduation..   Know Your Numbers and Heart Failure: - Group verbal and visual instruction to discuss disease risk factors for cardiac and pulmonary disease and treatment options.  Reviews associated critical values for Overweight/Obesity, Hypertension, Cholesterol, and Diabetes.  Discusses basics of heart failure: signs/symptoms and treatments.  Introduces Heart Failure Zone chart for action plan for heart failure.  Written material given at graduation. Flowsheet Row Cardiac Rehab from 01/27/2024 in [facility] Cardiac and Pulmonary Rehab  Date 01/13/24  Educator [facility]  Instruction Review Code 1- Verbalizes Understanding       Infection Prevention: - Provides verbal and written material to individual with discussion of infection control including proper hand washing and  proper equipment cleaning during exercise session. Flowsheet Row Cardiac Rehab from 01/27/2024 in [facility] Cardiac and Pulmonary Rehab  Date 01/04/24  Educator MB  Instruction Review Code 1- Verbalizes Understanding       Falls Prevention: - Provides verbal and written material to individual with discussion of falls prevention and safety. Flowsheet Row Cardiac Rehab from 01/27/2024 in [facility] Cardiac and Pulmonary Rehab  Date 01/04/24  Educator MB  Instruction Review Code 1- Verbalizes Understanding       Other: -Provides group and verbal instruction on various topics (see comments)   Knowledge Questionnaire Score:  Knowledge Questionnaire Score - 01/13/24 1045       Knowledge Questionnaire Score   Pre Score 25/26             Core Components/Risk Factors/Patient Goals at Admission:  Personal Goals and Risk Factors at Admission - 01/04/24 1359       Core Components/Risk Factors/Patient Goals on Admission    Weight Management Yes;Weight Loss    Intervention Weight Management: Develop a combined nutrition and exercise program designed to reach desired caloric intake, while maintaining appropriate intake of nutrient and fiber, sodium and fats, and appropriate energy expenditure required for the weight goal.;Weight Management: Provide education and appropriate resources to help participant work on and attain dietary goals.;Weight Management/Obesity: Establish reasonable short term and long term weight goals.    Admit Weight 171 lb 6.4 oz (77.7 kg)    Goal Weight: Short Term 158 lb 3.2 oz (71.8 kg)    Goal Weight: Long Term 145 lb (65.8 kg)    Expected Outcomes Short Term: Continue to  assess and modify interventions until short term weight is achieved;Long Term: Adherence to nutrition and physical activity/exercise program aimed toward attainment of established weight goal;Understanding recommendations for meals to include 15-35% energy as protein, 25-35% energy from fat, 35-60% energy  from carbohydrates, less than 200mg  of dietary cholesterol, 20-35 gm of total fiber daily;Understanding of distribution of calorie intake throughout the day with the consumption of 4-5 meals/snacks;Weight Loss: Understanding of general recommendations for a balanced deficit meal plan, which promotes 1-2 lb weight loss per week and includes a negative energy balance of [phone] kcal/d    Heart Failure Yes    Intervention Provide a combined exercise and nutrition program that is supplemented with education, support and counseling about heart failure. Directed toward relieving symptoms such as shortness of breath, decreased exercise tolerance, and extremity edema.    Expected Outcomes Improve functional capacity of life;Short term: Attendance in program 2-3 days a week with increased exercise capacity. Reported lower sodium intake. Reported increased fruit and vegetable intake. Reports medication compliance.;Short term: Daily weights obtained and reported for increase. Utilizing diuretic protocols set by physician.;Long term: Adoption of self-care skills and reduction of barriers for early signs and symptoms recognition and intervention leading to self-care maintenance.    Hypertension Yes    Intervention Provide education on lifestyle modifcations including regular physical activity/exercise, weight management, moderate sodium restriction and increased consumption of fresh fruit, vegetables, and low fat dairy, alcohol moderation, and smoking cessation.;Monitor prescription use compliance.    Expected Outcomes Short Term: Continued assessment and intervention until BP is < 140/25mm HG in hypertensive participants. < 130/63mm HG in hypertensive participants with diabetes, heart failure or chronic kidney disease.;Long Term: Maintenance of blood pressure at goal levels.    Lipids Yes    Intervention Provide education and support for participant on nutrition & aerobic/resistive exercise along with prescribed  medications to achieve LDL 70mg , HDL >40mg .    Expected Outcomes Short Term: Participant states understanding of desired cholesterol values and is compliant with medications prescribed. Participant is following exercise prescription and nutrition guidelines.;Long Term: Cholesterol controlled with medications as prescribed, with individualized exercise RX and with personalized nutrition plan. Value goals: LDL < 70mg , HDL > 40 mg.             Education:Diabetes - Individual verbal and written instruction to review signs/symptoms of diabetes, desired ranges of glucose level fasting, after meals and with exercise. Acknowledge that pre and post exercise glucose checks will be done for 3 sessions at entry of program.   Core Components/Risk Factors/Patient Goals Review:    Core Components/Risk Factors/Patient Goals at Discharge (Final Review):    ITP Comments:  ITP Comments     Row Name 12/28/23 1620 01/04/24 1350 01/06/24 1045 01/27/24 1317     ITP Comments Virtual Visit completed. Patient informed on EP and RD appointment and 6 Minute walk test. Patient also informed of patient health questionnaires on My Chart. Patient Verbalizes understanding. Visit diagnosis can be found in [facility] 11/20/2023. Completed and gym orientation for cardiac rehab. Initial ITP created and sent for review to Dr. Firman Hughes, Medical Director. First full day of exercise!  Patient was oriented to gym and equipment including functions, settings, policies, and procedures.  Patient's individual exercise prescription and treatment plan were reviewed.  All starting workloads were established based on the results of the 6 minute walk test done at initial orientation visit.  The plan for exercise progression was also introduced and progression will be customized based on  patient's performance and goals. 30 Day review completed. Medical Director ITP review done, changes made as directed, and signed approval by Medical Director.  New to program.             Comments: 30 day review

## 2024-01-28 ENCOUNTER — Encounter: Admitting: *Deleted

## 2024-01-28 DIAGNOSIS — Z955 Presence of coronary angioplasty implant and graft: Secondary | ICD-10-CM | POA: Diagnosis not present

## 2024-01-28 NOTE — Progress Notes (Signed)
 Daily Session Note  Patient Details  Name: Sara Hodges MRN: [id number] Date of Birth: November 02, 1950 Referring Provider:   Flowsheet Row Cardiac Rehab from 01/04/2024 in [facility] Cardiac and Pulmonary Rehab  Referring Provider Corbett Desanctis MD       Encounter Date: 01/28/2024  Check In:  Session Check In - 01/28/24 1726       Check-In   Supervising physician immediately available to respond to emergencies See telemetry face sheet for immediately available ER MD    Location [facility]-Cardiac & Pulmonary Rehab    Staff Present Sue Em RN,BSN;Joseph [facility] BS, Exercise Physiologist    Virtual Visit No    Medication changes reported     No    Fall or balance concerns reported    No    Warm-up and Cool-down Performed on first and last piece of equipment    Resistance Training Performed Yes    VAD Patient? No    PAD/SET Patient? No      Pain Assessment   Currently in Pain? No/denies                Social History   Tobacco Use  Smoking Status Never  Smokeless Tobacco Never    Goals Met:  Independence with exercise equipment Exercise tolerated well No report of concerns or symptoms today Strength training completed today  Goals Unmet:  Not Applicable  Comments: Pt able to follow exercise prescription today without complaint.  Will continue to monitor for progression.    Dr. Firman Hughes is Medical Director for [facility] Cardiac Rehabilitation.  Dr. Fuad Aleskerov is Medical Director for [facility] Pulmonary Rehabilitation.

## 2024-02-03 ENCOUNTER — Encounter

## 2024-02-04 ENCOUNTER — Encounter: Admitting: *Deleted

## 2024-02-04 DIAGNOSIS — Z955 Presence of coronary angioplasty implant and graft: Secondary | ICD-10-CM

## 2024-02-04 NOTE — Progress Notes (Signed)
 Daily Session Note  Patient Details  Name: Sara Hodges MRN: [id number] Date of Birth: 01-Sep-1951 Referring Provider:   Flowsheet Row Cardiac Rehab from 01/04/2024 in [facility] Cardiac and Pulmonary Rehab  Referring Provider Corbett Desanctis MD       Encounter Date: 02/04/2024  Check In:  Session Check In - 02/04/24 1723       Check-In   Supervising physician immediately available to respond to emergencies See telemetry face sheet for immediately available ER MD    Location [facility]-Cardiac & Pulmonary Rehab    Staff Present Sue Em RN,BSN;Joseph [facility] BS, Exercise Physiologist    Virtual Visit No    Medication changes reported     No    Fall or balance concerns reported    No    Warm-up and Cool-down Performed on first and last piece of equipment    Resistance Training Performed Yes    VAD Patient? No    PAD/SET Patient? No      Pain Assessment   Currently in Pain? No/denies                Social History   Tobacco Use  Smoking Status Never  Smokeless Tobacco Never    Goals Met:  Independence with exercise equipment Exercise tolerated well No report of concerns or symptoms today Strength training completed today  Goals Unmet:  Not Applicable  Comments: Pt able to follow exercise prescription today without complaint.  Will continue to monitor for progression.    Dr. Firman Hughes is Medical Director for [facility] Cardiac Rehabilitation.  Dr. Fuad Aleskerov is Medical Director for [facility] Pulmonary Rehabilitation.

## 2024-02-10 ENCOUNTER — Encounter

## 2024-02-11 ENCOUNTER — Encounter: Admitting: *Deleted

## 2024-02-11 DIAGNOSIS — Z955 Presence of coronary angioplasty implant and graft: Secondary | ICD-10-CM

## 2024-02-11 NOTE — Progress Notes (Signed)
 Daily Session Note  Patient Details  Name: Sara Hodges MRN: [id number] Date of Birth: 10/11/1950 Referring Provider:   Flowsheet Row Cardiac Rehab from 01/04/2024 in [facility] Cardiac and Pulmonary Rehab  Referring Provider Corbett Desanctis MD       Encounter Date: 02/11/2024  Check In:  Session Check In - 02/11/24 1726       Check-In   Supervising physician immediately available to respond to emergencies See telemetry face sheet for immediately available ER MD    Location [facility]-Cardiac & Pulmonary Rehab    Staff Present Sue Em RN,BSN;Joseph [facility] BS, Exercise Physiologist    Virtual Visit No    Medication changes reported     No    Fall or balance concerns reported    No    Warm-up and Cool-down Performed on first and last piece of equipment    Resistance Training Performed Yes    VAD Patient? No    PAD/SET Patient? No      Pain Assessment   Currently in Pain? No/denies                Social History   Tobacco Use  Smoking Status Never  Smokeless Tobacco Never    Goals Met:  Independence with exercise equipment Exercise tolerated well No report of concerns or symptoms today Strength training completed today  Goals Unmet:  Not Applicable  Comments: Pt able to follow exercise prescription today without complaint.  Will continue to monitor for progression.    Dr. Firman Hughes is Medical Director for [facility] Cardiac Rehabilitation.  Dr. Fuad Aleskerov is Medical Director for [facility] Pulmonary Rehabilitation.

## 2024-02-17 ENCOUNTER — Encounter: Admitting: *Deleted

## 2024-02-17 DIAGNOSIS — Z955 Presence of coronary angioplasty implant and graft: Secondary | ICD-10-CM | POA: Diagnosis not present

## 2024-02-17 NOTE — Progress Notes (Signed)
 Daily Session Note  Patient Details  Name: Sara Hodges MRN: [id number] Date of Birth: January 06, 1951 Referring Provider:   Flowsheet Row Cardiac Rehab from 01/04/2024 in [facility] Cardiac and Pulmonary Rehab  Referring Provider Corbett Desanctis MD       Encounter Date: 02/17/2024  Check In:  Session Check In - 02/17/24 1011       Check-In   Supervising physician immediately available to respond to emergencies See telemetry face sheet for immediately available ER MD    Location [facility]-Cardiac & Pulmonary Rehab    Staff Present Maud Sorenson, RN, BSN, CCRP;Maxon Conetta BS, Exercise Physiologist;Noah Tickle, BS, Exercise Physiologist    Virtual Visit No    Medication changes reported     No    Fall or balance concerns reported    No    Warm-up and Cool-down Performed on first and last piece of equipment    Resistance Training Performed Yes    VAD Patient? No    PAD/SET Patient? No      Pain Assessment   Currently in Pain? No/denies                Social History   Tobacco Use  Smoking Status Never  Smokeless Tobacco Never    Goals Met:  Independence with exercise equipment Exercise tolerated well No report of concerns or symptoms today  Goals Unmet:  Not Applicable  Comments: Pt able to follow exercise prescription today without complaint.  Will continue to monitor for progression.    Dr. Firman Hughes is Medical Director for [facility] Cardiac Rehabilitation.  Dr. Fuad Aleskerov is Medical Director for [facility] Pulmonary Rehabilitation.

## 2024-02-18 ENCOUNTER — Encounter: Admitting: *Deleted

## 2024-02-18 DIAGNOSIS — Z955 Presence of coronary angioplasty implant and graft: Secondary | ICD-10-CM

## 2024-02-18 NOTE — Progress Notes (Signed)
 Daily Session Note  Patient Details  Name: Sara Hodges MRN: [id number] Date of Birth: July 04, 1951 Referring Provider:   Flowsheet Row Cardiac Rehab from 01/04/2024 in [facility] Cardiac and Pulmonary Rehab  Referring Provider Corbett Desanctis MD       Encounter Date: 02/18/2024  Check In:  Session Check In - 02/18/24 1732       Check-In   Supervising physician immediately available to respond to emergencies See telemetry face sheet for immediately available ER MD    Location [facility]-Cardiac & Pulmonary Rehab    Staff Present Maud Sorenson, RN, BSN, CCRP;Joseph Hood RCP,RRT,BSRT;Maxon [place] BS, Exercise Physiologist    Virtual Visit No    Medication changes reported     No    Fall or balance concerns reported    No    Warm-up and Cool-down Performed on first and last piece of equipment    Resistance Training Performed Yes    VAD Patient? No    PAD/SET Patient? No      Pain Assessment   Currently in Pain? No/denies                Social History   Tobacco Use  Smoking Status Never  Smokeless Tobacco Never    Goals Met:  Independence with exercise equipment Exercise tolerated well No report of concerns or symptoms today  Goals Unmet:  Not Applicable  Comments: Pt able to follow exercise prescription today without complaint.  Will continue to monitor for progression.    Dr. Firman Hughes is Medical Director for [facility] Cardiac Rehabilitation.  Dr. Fuad Aleskerov is Medical Director for [facility] Pulmonary Rehabilitation.

## 2024-02-24 ENCOUNTER — Encounter: Payer: Self-pay | Admitting: *Deleted

## 2024-02-24 ENCOUNTER — Encounter: Attending: Cardiovascular Disease

## 2024-02-24 DIAGNOSIS — Z48812 Encounter for surgical aftercare following surgery on the circulatory system: Secondary | ICD-10-CM | POA: Insufficient documentation

## 2024-02-24 DIAGNOSIS — Z955 Presence of coronary angioplasty implant and graft: Secondary | ICD-10-CM | POA: Insufficient documentation

## 2024-02-24 NOTE — Progress Notes (Signed)
 Cardiac Individual Treatment Plan  Patient Details  Name: Sara Hodges MRN: [id number] Date of Birth: 12-12-1950 Referring Provider:   Flowsheet Row Cardiac Rehab from 01/04/2024 in [facility] Cardiac and Pulmonary Rehab  Referring Provider Corbett Desanctis MD       Initial Encounter Date:  Flowsheet Row Cardiac Rehab from 01/04/2024 in [facility] Cardiac and Pulmonary Rehab  Date 01/04/24       Visit Diagnosis: Status post coronary artery stent placement  Patient's Home Medications on Admission:  Current Outpatient Medications:    acetaminophen  (TYLENOL ) 500 MG tablet, Take by mouth., Disp: , Rfl:    aspirin  EC 81 MG tablet, Take 81 mg by mouth daily. , Disp: , Rfl:    celecoxib (CELEBREX) 200 MG capsule, , Disp: , Rfl:    clopidogrel (PLAVIX) 75 MG tablet, Take 75 mg by mouth daily., Disp: , Rfl:    ENTRESTO  49-51 MG, Take 1 tablet by mouth 2 (two) times daily. (Patient not taking: Reported on 12/28/2023), Disp: , Rfl:    ENTRESTO  97-103 MG, Take 1 tablet by mouth 2 (two) times daily., Disp: , Rfl:    folic acid  (FOLVITE ) 1 MG tablet, Take 1 mg by mouth daily. (Patient not taking: Reported on 12/28/2023), Disp: , Rfl:    folic acid  (FOLVITE ) 1 MG tablet, Take by mouth., Disp: , Rfl:    furosemide  (LASIX ) 20 MG tablet, Take 20 mg by mouth daily as needed for fluid., Disp: , Rfl:    levocetirizine (XYZAL) 5 MG tablet, Take 1 tablet by mouth every evening., Disp: , Rfl:    metoprolol succinate (TOPROL-XL) 25 MG 24 hr tablet, , Disp: , Rfl:    metoprolol succinate (TOPROL-XL) 25 MG 24 hr tablet, Take by mouth., Disp: , Rfl:    nitroGLYCERIN (NITROSTAT) 0.4 MG SL tablet, Place under the tongue., Disp: , Rfl:    pantoprazole (PROTONIX) 40 MG tablet, Take 40 mg by mouth daily., Disp: , Rfl:    polyethylene glycol powder (GLYCOLAX/MIRALAX) 17 GM/SCOOP powder, Take by mouth. (Patient not taking: Reported on 12/28/2023), Disp: , Rfl:    predniSONE (DELTASONE) 10 MG tablet, 2 tabs daily for 4 days, 1 tab  daily for 4 days, Disp: , Rfl:    spironolactone  (ALDACTONE ) 25 MG tablet, Take 25 mg by mouth daily. (Patient not taking: Reported on 12/28/2023), Disp: , Rfl:    triamcinolone ointment (KENALOG) 0.1 %, Apply twice daily to raised itchy areas until smooth (typically 5-10 days) (Patient not taking: Reported on 12/28/2023), Disp: , Rfl:    Vitamin D , Ergocalciferol , (DRISDOL ) 50000 UNITS CAPS capsule, Take 50,000 Units by mouth every 7 (seven) days. Takes on Sunday, Disp: , Rfl:   Past Medical History: Past Medical History:  Diagnosis Date   CAD (coronary artery disease)    CHF (congestive heart failure) (HCC)    HLD (hyperlipidemia)    Hypertension    Pancreatic cyst    Pancreatic mass     Tobacco Use: Social History   Tobacco Use  Smoking Status Never  Smokeless Tobacco Never    Labs: Review Flowsheet        No data to display           Exercise Target Goals: Exercise Program Goal: Individual exercise prescription set using results from initial 6 min walk test and THRR while considering  patient's activity barriers and safety.   Exercise Prescription Goal: Initial exercise prescription builds to 30-45 minutes a day of aerobic activity, 2-3 days per week.  Home exercise guidelines will be given to patient during program as part of exercise prescription that the participant will acknowledge.   Education: Aerobic Exercise: - Group verbal and visual presentation on the components of exercise prescription. Introduces F.I.T.T principle from ACSM for exercise prescriptions.  Reviews F.I.T.T. principles of aerobic exercise including progression. Written material given at graduation.   Education: Resistance Exercise: - Group verbal and visual presentation on the components of exercise prescription. Introduces F.I.T.T principle from ACSM for exercise prescriptions  Reviews F.I.T.T. principles of resistance exercise including progression. Written material given at graduation.     Education: Exercise & Equipment Safety: - Individual verbal instruction and demonstration of equipment use and safety with use of the equipment. Flowsheet Row Cardiac Rehab from 02/24/2024 in [facility] Cardiac and Pulmonary Rehab  Date 01/04/24  Educator MB  Instruction Review Code 1- Verbalizes Understanding       Education: Exercise Physiology & General Exercise Guidelines: - Group verbal and written instruction with models to review the exercise physiology of the cardiovascular system and associated critical values. Provides general exercise guidelines with specific guidelines to those with heart or lung disease.    Education: Flexibility, Balance, Mind/Body Relaxation: - Group verbal and visual presentation with interactive activity on the components of exercise prescription. Introduces F.I.T.T principle from ACSM for exercise prescriptions. Reviews F.I.T.T. principles of flexibility and balance exercise training including progression. Also discusses the mind body connection.  Reviews various relaxation techniques to help reduce and manage stress (i.e. Deep breathing, progressive muscle relaxation, and visualization). Balance handout provided to take home. Written material given at graduation.   Activity Barriers & Risk Stratification:  Activity Barriers & Cardiac Risk Stratification - 01/04/24 1352       Activity Barriers & Cardiac Risk Stratification   Activity Barriers Back Problems;Other (comment)    Comments L leg aches (mainly at night), occasional low and mid back pain due to degenerative disc disease    Cardiac Risk Stratification Moderate             6 Minute Walk:  6 Minute Walk     Row Name 01/04/24 1350         6 Minute Walk   Phase Initial     Distance 1390 feet     Walk Time 6 minutes     # of Rest Breaks 0     MPH 2.63     METS 2.83     RPE 9     Perceived Dyspnea  0     VO2 Peak 9.89     Symptoms No     Resting HR 69 bpm     Resting BP 108/70      Resting Oxygen Saturation  99 %     Exercise Oxygen Saturation  during 6 min walk 100 %     Max Ex. HR 90 bpm     Max Ex. BP 146/68     2 Minute Post BP 118/66              Oxygen Initial Assessment:   Oxygen Re-Evaluation:   Oxygen Discharge (Final Oxygen Re-Evaluation):   Initial Exercise Prescription:  Initial Exercise Prescription - 01/04/24 1300       Date of Initial Exercise RX and Referring Provider   Date 01/04/24    Referring Provider Corbett Desanctis MD      Oxygen   Maintain Oxygen Saturation 88% or higher      Recumbant Bike   Level 2  RPM 50    Watts 20    Minutes 15    METs 2.83      NuStep   Level 2    SPM 80    Minutes 15    METs 2.83      REL-XR   Level 1    Speed 50    Minutes 15    METs 2.83      T5 Nustep   Level 2    SPM 80    Minutes 15    METs 2.83      Track   Laps 33    Minutes 15    METs 2.79      Prescription Details   Frequency (times per week) 2    Duration Progress to 30 minutes of continuous aerobic without signs/symptoms of physical distress      Intensity   THRR 40-80% of Max Heartrate 100-131    Ratings of Perceived Exertion 11-13    Perceived Dyspnea 0-4      Progression   Progression Continue to progress workloads to maintain intensity without signs/symptoms of physical distress.      Resistance Training   Training Prescription Yes    Weight 4 lb    Reps 10-15             Perform Capillary Blood Glucose checks as needed.  Exercise Prescription Changes:   Exercise Prescription Changes     Row Name 01/04/24 1300 01/14/24 1800 01/26/24 1500 02/11/24 1800       Response to Exercise   Blood Pressure (Admit) 108/70 102/58 102/60 134/62    Blood Pressure (Exercise) 146/68 130/60 148/62 144/58    Blood Pressure (Exit) 118/66 108/60 110/62 118/70    Heart Rate (Admit) 69 bpm 82 bpm 77 bpm 72 bpm    Heart Rate (Exercise) 90 bpm 90 bpm 116 bpm 116 bpm    Heart Rate (Exit) 69 bpm 74 bpm 93  bpm 85 bpm    Oxygen Saturation (Admit) 99 % -- -- --    Oxygen Saturation (Exercise) 100 % -- -- --    Oxygen Saturation (Exit) 100 % -- -- --    Rating of Perceived Exertion (Exercise) 9 11 13 13     Perceived Dyspnea (Exercise) 0 -- -- --    Symptoms none none none none    Comments results 1st full day of exercise -- --    Duration Progress to 30 minutes of  aerobic without signs/symptoms of physical distress Continue with 30 min of aerobic exercise without signs/symptoms of physical distress. Continue with 30 min of aerobic exercise without signs/symptoms of physical distress. Continue with 30 min of aerobic exercise without signs/symptoms of physical distress.    Intensity THRR New THRR unchanged THRR unchanged THRR unchanged      Progression   Progression Continue to progress workloads to maintain intensity without signs/symptoms of physical distress. Continue to progress workloads to maintain intensity without signs/symptoms of physical distress. Continue to progress workloads to maintain intensity without signs/symptoms of physical distress. Continue to progress workloads to maintain intensity without signs/symptoms of physical distress.    Average METs 2.83 3.38 3.07 3.17      Resistance Training   Training Prescription -- Yes Yes Yes    Weight -- 4 lb 5 lb 5 lb    Reps -- 10-15 10-15 10-15      Interval Training   Interval Training -- No No No      Treadmill  MPH -- -- 2.5 2.6    Grade -- -- 0 0.5    Minutes -- -- 15 15    METs -- -- 2.91 3.17      Recumbant Bike   Level -- -- 5 5    Watts -- -- 39 20    Minutes -- -- 15 15    METs -- -- 2.81 2.8      NuStep   Level -- 2 4 4     Minutes -- 15 15 15     METs -- 5 4 4.4      REL-XR   Level -- -- 6 --    Minutes -- -- 15 --    METs -- -- 4.5 --      T5 Nustep   Level -- -- 3 3    Minutes -- -- 15 15    METs -- -- 2.2 2.3      Track   Laps -- 14 14 --    Minutes -- 15 15 --    METs -- 1.76 1.76 --       Oxygen   Maintain Oxygen Saturation -- 88% or higher 88% or higher 88% or higher             Exercise Comments:   Exercise Comments     Row Name 01/06/24 1045           Exercise Comments First full day of exercise!  Patient was oriented to gym and equipment including functions, settings, policies, and procedures.  Patient's individual exercise prescription and treatment plan were reviewed.  All starting workloads were established based on the results of the 6 minute walk test done at initial orientation visit.  The plan for exercise progression was also introduced and progression will be customized based on patient's performance and goals.                Exercise Goals and Review:   Exercise Goals     Row Name 01/04/24 1356             Exercise Goals   Increase Physical Activity Yes       Intervention Provide advice, education, support and counseling about physical activity/exercise needs.;Develop an individualized exercise prescription for aerobic and resistive training based on initial evaluation findings, risk stratification, comorbidities and participant's personal goals.       Expected Outcomes Short Term: Attend rehab on a regular basis to increase amount of physical activity.;Long Term: Add in home exercise to make exercise part of routine and to increase amount of physical activity.;Long Term: Exercising regularly at least 3-5 days a week.       Increase Strength and Stamina Yes       Intervention Provide advice, education, support and counseling about physical activity/exercise needs.;Develop an individualized exercise prescription for aerobic and resistive training based on initial evaluation findings, risk stratification, comorbidities and participant's personal goals.       Expected Outcomes Short Term: Increase workloads from initial exercise prescription for resistance, speed, and METs.;Short Term: Perform resistance training exercises routinely during rehab  and add in resistance training at home;Long Term: Improve cardiorespiratory fitness, muscular endurance and strength as measured by increased METs and functional capacity ( )       Able to understand and use rate of perceived exertion (RPE) scale Yes       Intervention Provide education and explanation on how to use RPE scale       Expected Outcomes Short Term:  Able to use RPE daily in rehab to express subjective intensity level;Long Term:  Able to use RPE to guide intensity level when exercising independently       Able to understand and use Dyspnea scale Yes       Intervention Provide education and explanation on how to use Dyspnea scale       Expected Outcomes Short Term: Able to use Dyspnea scale daily in rehab to express subjective sense of shortness of breath during exertion;Long Term: Able to use Dyspnea scale to guide intensity level when exercising independently       Knowledge and understanding of Target Heart Rate Range (THRR) Yes       Intervention Provide education and explanation of THRR including how the numbers were predicted and where they are located for reference       Expected Outcomes Short Term: Able to state/look up THRR;Short Term: Able to use daily as guideline for intensity in rehab;Long Term: Able to use THRR to govern intensity when exercising independently       Able to check pulse independently Yes       Intervention Provide education and demonstration on how to check pulse in carotid and radial arteries.;Review the importance of being able to check your own pulse for safety during independent exercise       Expected Outcomes Short Term: Able to explain why pulse checking is important during independent exercise;Long Term: Able to check pulse independently and accurately       Understanding of Exercise Prescription Yes       Intervention Provide education, explanation, and written materials on patient's individual exercise prescription       Expected Outcomes Short  Term: Able to explain program exercise prescription;Long Term: Able to explain home exercise prescription to exercise independently                Exercise Goals Re-Evaluation :  Exercise Goals Re-Evaluation     Row Name 01/06/24 1045 01/14/24 1805 01/26/24 1527 02/11/24 1813       Exercise Goal Re-Evaluation   Exercise Goals Review Able to understand and use rate of perceived exertion (RPE) scale;Knowledge and understanding of Target Heart Rate Range (THRR);Understanding of Exercise Prescription;Able to understand and use Dyspnea scale Increase Physical Activity;Understanding of Exercise Prescription;Increase Strength and Stamina Increase Physical Activity;Understanding of Exercise Prescription;Increase Strength and Stamina Increase Physical Activity;Understanding of Exercise Prescription;Increase Strength and Stamina    Comments Reviewed RPE and dyspnea scale, THR and program prescription with pt today.  Pt voiced understanding and was given a copy of goals to take home. Marice completed her first exercise session in this review. She was able to do level 2 on the T4 nustep and 14 laps on the track. We will continue to monitor her progress in the program. Elbony is doing well in the rehab. She improved to level 3 on the T5 nustep, level 4 on the T4 nustep, level 5 on the recumbent bike, and level 6 on the XR. She also began using the treadmill at a speed of 2.5 mph with no incline. We will continue to monitor her progress in the program. Viviana is doing well in the rehab. She increased her workload on the treadmill to a speed of 2.6 mph and incline of 0.5%. She maintained level 4 on the T4 nustep, level 3 on the T5 nustep, and level 5 on the recumbent bike. We will continue to monitor her progress in the program.    Expected Outcomes  Short: Use RPE daily to regulate intensity. Long: Follow program prescription in THR. Short: Continue to follow current exercise prescription. Long: Continue exercise to  improve strength and stamina. Short: Continue to progressively increase treadmill workload. Long: Continue exercise to improve strength and stamina. Short: Try level 4 on the T5 nustep. Long: Continue exercise to improve strength and stamina.             Discharge Exercise Prescription (Final Exercise Prescription Changes):  Exercise Prescription Changes - 02/11/24 1800       Response to Exercise   Blood Pressure (Admit) 134/62    Blood Pressure (Exercise) 144/58    Blood Pressure (Exit) 118/70    Heart Rate (Admit) 72 bpm    Heart Rate (Exercise) 116 bpm    Heart Rate (Exit) 85 bpm    Rating of Perceived Exertion (Exercise) 13    Symptoms none    Duration Continue with 30 min of aerobic exercise without signs/symptoms of physical distress.    Intensity THRR unchanged      Progression   Progression Continue to progress workloads to maintain intensity without signs/symptoms of physical distress.    Average METs 3.17      Resistance Training   Training Prescription Yes    Weight 5 lb    Reps 10-15      Interval Training   Interval Training No      Treadmill   MPH 2.6    Grade 0.5    Minutes 15    METs 3.17      Recumbant Bike   Level 5    Watts 20    Minutes 15    METs 2.8      NuStep   Level 4    Minutes 15    METs 4.4      T5 Nustep   Level 3    Minutes 15    METs 2.3      Oxygen   Maintain Oxygen Saturation 88% or higher             Nutrition:  Target Goals: Understanding of nutrition guidelines, daily intake of sodium 1500mg , cholesterol 200mg , calories 30% from fat and 7% or less from saturated fats, daily to have 5 or more servings of fruits and vegetables.  Education: All About Nutrition: -Group instruction provided by verbal, written material, interactive activities, discussions, models, and posters to present general guidelines for heart healthy nutrition including fat, fiber, MyPlate, the role of sodium in heart healthy nutrition,  utilization of the nutrition label, and utilization of this knowledge for meal planning. Follow up email sent as well. Written material given at graduation. Flowsheet Row Cardiac Rehab from 02/24/2024 in [facility] Cardiac and Pulmonary Rehab  Date 02/24/24  Educator [email or web address]  Instruction Review Code 1- Verbalizes Understanding       Biometrics:  Pre Biometrics - 01/04/24 1357       Pre Biometrics   Height 5' 5.39" (1.661 m)    Weight 171 lb 6.4 oz (77.7 kg)    Waist Circumference 33 inches    Hip Circumference 43.5 inches    Waist to Hip Ratio 0.76 %    BMI (Calculated) 28.18    Single Leg Stand 30 seconds              Nutrition Therapy Plan and Nutrition Goals:  Nutrition Therapy & Goals - 01/06/24 1115       Nutrition Therapy   Diet Cardiac, Low Na  Protein (specify units) 80g    Fiber 25 grams    Whole Grain Foods 3 servings    Saturated Fats 15 max. grams    Fruits and Vegetables 5 servings/day    Sodium 2 grams      Personal Nutrition Goals   Nutrition Goal Read labels and reduce sodium intake to below 2300mg . Ideally 1500mg  per day.    Personal Goal #2 Eat 15-30gProtein and 30-60gCarbs at each meal.    Personal Goal #3 Reduce saturated fat, less than 12g per day. Replace bad fats for more heart healthy fats.    Comments Patient drinking mostly water but sometimes has a sweet beverage. She reports she has been working on cutting back on the sugary beverages. She reports eating healthy choices mostly, says sometimes she snacks at night on popcorn with butter or cookies. Sometimes she might cook eggs and bacon for breakfast on weekends but not every week. Encouraged her to enjoy comfort foods from time to time, but should not be regular or routine. Reviewed mediterranean diet handout. Educated on types of fats, sources, how to read labels. Provided guidelines on sodium and saturated fat, recommending she stay below 1500mg  sodium and 15gSat fat. Brainstormed some meal and  snack ideas with foods she likes and will eat focusing on balanced plates with adequate protein and controlled sodium.      Intervention Plan   Intervention Prescribe, educate and counsel regarding individualized specific dietary modifications aiming towards targeted core components such as weight, hypertension, lipid management, diabetes, heart failure and other comorbidities.;Nutrition handout(s) given to patient.    Expected Outcomes Short Term Goal: Understand basic principles of dietary content, such as calories, fat, sodium, cholesterol and nutrients.;Short Term Goal: A plan has been developed with personal nutrition goals set during dietitian appointment.;Long Term Goal: Adherence to prescribed nutrition plan.             Nutrition Assessments:  MEDIFICTS Score Key: >=70 Need to make dietary changes  40-70 Heart Healthy Diet <= 40 Therapeutic Level Cholesterol Diet  Flowsheet Row Cardiac Rehab from 01/13/2024 in [facility] Cardiac and Pulmonary Rehab  Picture Your Plate Total Score on Admission 61      Picture Your Plate Scores: <14 Unhealthy dietary pattern with much room for improvement. 41-50 Dietary pattern unlikely to meet recommendations for good health and room for improvement. 51-60 More healthful dietary pattern, with some room for improvement.  >60 Healthy dietary pattern, although there may be some specific behaviors that could be improved.    Nutrition Goals Re-Evaluation:   Nutrition Goals Discharge (Final Nutrition Goals Re-Evaluation):   Psychosocial: Target Goals: Acknowledge presence or absence of significant depression and/or stress, maximize coping skills, provide positive support system. Participant is able to verbalize types and ability to use techniques and skills needed for reducing stress and depression.   Education: Stress, Anxiety, and Depression - Group verbal and visual presentation to define topics covered.  Reviews how body is impacted by stress,  anxiety, and depression.  Also discusses healthy ways to reduce stress and to treat/manage anxiety and depression.  Written material given at graduation. Flowsheet Row Cardiac Rehab from 02/24/2024 in [facility] Cardiac and Pulmonary Rehab  Date 01/27/24  Educator [facility]  Instruction Review Code 1- [employer] Understanding       Education: Sleep Hygiene -Provides group verbal and written instruction about how sleep can affect your health.  Define sleep hygiene, discuss sleep cycles and impact of sleep habits. Review good sleep hygiene tips.  Initial Review & Psychosocial Screening:  Initial Psych Review & Screening - 12/28/23 1617       Initial Review   Current issues with None Identified      Family Dynamics   Good Support System? Yes    Comments Murrell can look to her friends, significant other and daughter for support. She states no depression or anxiety and does not take any medications for her mood.      Barriers   Psychosocial barriers to participate in program There are no identifiable barriers or psychosocial needs.;The patient should benefit from training in stress management and relaxation.      Screening Interventions   Interventions Encouraged to exercise;Provide feedback about the scores to participant;To provide support and resources with identified psychosocial needs    Expected Outcomes Short Term goal: Utilizing psychosocial counselor, staff and physician to assist with identification of specific Stressors or current issues interfering with healing process. Setting desired goal for each stressor or current issue identified.;Long Term Goal: Stressors or current issues are controlled or eliminated.;Short Term goal: Identification and review with participant of any Quality of Life or Depression concerns found by scoring the questionnaire.;Long Term goal: The participant improves quality of Life and PHQ9 Scores as seen by post scores and/or verbalization of changes              Quality of Life Scores:   Quality of Life - 01/13/24 1045       Quality of Life   Select Quality of Life      Quality of Life Scores   Health/Function Pre 25.86 %    Socioeconomic Pre 22.56 %    Psych/Spiritual Pre 25.75 %    Family Pre 24.9 %    GLOBAL Pre 24.89 %            Scores of 19 and below usually indicate a poorer quality of life in these areas.  A difference of  2-3 points is a clinically meaningful difference.  A difference of 2-3 points in the total score of the Quality of Life Index has been associated with significant improvement in overall quality of life, self-image, physical symptoms, and general health in studies assessing change in quality of life.  PHQ-9: Review Flowsheet       01/04/2024 04/22/2016  Depression screen PHQ 2/9  Decreased Interest 0 0  Down, Depressed, Hopeless 0 0  PHQ - 2 Score 0 0  Altered sleeping 1 0  Tired, decreased energy 0 1  Change in appetite 0 1  Feeling bad or failure about yourself  0 0  Trouble concentrating 0 0  Moving slowly or fidgety/restless 0 0  Suicidal thoughts 0 0  PHQ-9 Score 1 2  Difficult doing work/chores Not difficult at all Not difficult at all   Interpretation of Total Score  Total Score Depression Severity:  1-4 = Minimal depression, 5-9 = Mild depression, 10-14 = Moderate depression, 15-19 = Moderately severe depression, 20-27 = Severe depression   Psychosocial Evaluation and Intervention:  Psychosocial Evaluation - 12/28/23 1618       Psychosocial Evaluation & Interventions   Interventions Encouraged to exercise with the program and follow exercise prescription;Relaxation education;Stress management education    Comments Carlyon can look to her friends, significant other and daughter for support. She states no depression or anxiety and does not take any medications for her mood.    Expected Outcomes Short: Start [facility] to help with mood. Long: Maintain a healthy mental state  Continue  Psychosocial Services  Follow up required by staff             Psychosocial Re-Evaluation:   Psychosocial Discharge (Final Psychosocial Re-Evaluation):   Vocational Rehabilitation: Provide vocational rehab assistance to qualifying candidates.   Vocational Rehab Evaluation & Intervention:   Education: Education Goals: Education classes will be provided on a variety of topics geared toward better understanding of heart health and risk factor modification. Participant will state understanding/return demonstration of topics presented as noted by education test scores.  Learning Barriers/Preferences:  Learning Barriers/Preferences - 12/28/23 1614       Learning Barriers/Preferences   Learning Barriers None    Learning Preferences Group Instruction;Individual Instruction;Pictoral;Skilled Demonstration;Verbal Instruction;Video;Written Material             General Cardiac Education Topics:  AED/CPR: - Group verbal and written instruction with the use of models to demonstrate the basic use of the AED with the basic ABC's of resuscitation.   Anatomy and Cardiac Procedures: - Group verbal and visual presentation and models provide information about basic cardiac anatomy and function. Reviews the testing methods done to diagnose heart disease and the outcomes of the test results. Describes the treatment choices: Medical Management, Angioplasty, or Coronary Bypass Surgery for treating various heart conditions including Myocardial Infarction, Angina, Valve Disease, and Cardiac Arrhythmias.  Written material given at graduation.   Medication Safety: - Group verbal and visual instruction to review commonly prescribed medications for heart and lung disease. Reviews the medication, class of the drug, and side effects. Includes the steps to properly store meds and maintain the prescription regimen.  Written material given at graduation.   Intimacy: - Group verbal instruction through  game format to discuss how heart and lung disease can affect sexual intimacy. Written material given at graduation..   Know Your Numbers and Heart Failure: - Group verbal and visual instruction to discuss disease risk factors for cardiac and pulmonary disease and treatment options.  Reviews associated critical values for Overweight/Obesity, Hypertension, Cholesterol, and Diabetes.  Discusses basics of heart failure: signs/symptoms and treatments.  Introduces Heart Failure Zone chart for action plan for heart failure.  Written material given at graduation. Flowsheet Row Cardiac Rehab from 02/24/2024 in [facility] Cardiac and Pulmonary Rehab  Date 01/13/24  Educator [facility]  Instruction Review Code 1- Verbalizes Understanding       Infection Prevention: - Provides verbal and written material to individual with discussion of infection control including proper hand washing and proper equipment cleaning during exercise session. Flowsheet Row Cardiac Rehab from 02/24/2024 in [facility] Cardiac and Pulmonary Rehab  Date 01/04/24  Educator MB  Instruction Review Code 1- Verbalizes Understanding       Falls Prevention: - Provides verbal and written material to individual with discussion of falls prevention and safety. Flowsheet Row Cardiac Rehab from 02/24/2024 in [facility] Cardiac and Pulmonary Rehab  Date 01/04/24  Educator MB  Instruction Review Code 1- Verbalizes Understanding       Other: -Provides group and verbal instruction on various topics (see comments)   Knowledge Questionnaire Score:  Knowledge Questionnaire Score - 01/13/24 1045       Knowledge Questionnaire Score   Pre Score 25/26             Core Components/Risk Factors/Patient Goals at Admission:  Personal Goals and Risk Factors at Admission - 01/04/24 1359       Core Components/Risk Factors/Patient Goals on Admission    Weight Management Yes;Weight Loss    Intervention Weight  Management: Develop a combined nutrition and exercise  program designed to reach desired caloric intake, while maintaining appropriate intake of nutrient and fiber, sodium and fats, and appropriate energy expenditure required for the weight goal.;Weight Management: Provide education and appropriate resources to help participant work on and attain dietary goals.;Weight Management/Obesity: Establish reasonable short term and long term weight goals.    Admit Weight 171 lb 6.4 oz (77.7 kg)    Goal Weight: Short Term 158 lb 3.2 oz (71.8 kg)    Goal Weight: Long Term 145 lb (65.8 kg)    Expected Outcomes Short Term: Continue to assess and modify interventions until short term weight is achieved;Long Term: Adherence to nutrition and physical activity/exercise program aimed toward attainment of established weight goal;Understanding recommendations for meals to include 15-35% energy as protein, 25-35% energy from fat, 35-60% energy from carbohydrates, less than 200mg  of dietary cholesterol, 20-35 gm of total fiber daily;Understanding of distribution of calorie intake throughout the day with the consumption of 4-5 meals/snacks;Weight Loss: Understanding of general recommendations for a balanced deficit meal plan, which promotes 1-2 lb weight loss per week and includes a negative energy balance of [phone] kcal/d    Heart Failure Yes    Intervention Provide a combined exercise and nutrition program that is supplemented with education, support and counseling about heart failure. Directed toward relieving symptoms such as shortness of breath, decreased exercise tolerance, and extremity edema.    Expected Outcomes Improve functional capacity of life;Short term: Attendance in program 2-3 days a week with increased exercise capacity. Reported lower sodium intake. Reported increased fruit and vegetable intake. Reports medication compliance.;Short term: Daily weights obtained and reported for increase. Utilizing diuretic protocols set by physician.;Long term: Adoption of self-care  skills and reduction of barriers for early signs and symptoms recognition and intervention leading to self-care maintenance.    Hypertension Yes    Intervention Provide education on lifestyle modifcations including regular physical activity/exercise, weight management, moderate sodium restriction and increased consumption of fresh fruit, vegetables, and low fat dairy, alcohol moderation, and smoking cessation.;Monitor prescription use compliance.    Expected Outcomes Short Term: Continued assessment and intervention until BP is < 140/59mm HG in hypertensive participants. < 130/33mm HG in hypertensive participants with diabetes, heart failure or chronic kidney disease.;Long Term: Maintenance of blood pressure at goal levels.    Lipids Yes    Intervention Provide education and support for participant on nutrition & aerobic/resistive exercise along with prescribed medications to achieve LDL 70mg , HDL >40mg .    Expected Outcomes Short Term: Participant states understanding of desired cholesterol values and is compliant with medications prescribed. Participant is following exercise prescription and nutrition guidelines.;Long Term: Cholesterol controlled with medications as prescribed, with individualized exercise RX and with personalized nutrition plan. Value goals: LDL < 70mg , HDL > 40 mg.             Education:Diabetes - Individual verbal and written instruction to review signs/symptoms of diabetes, desired ranges of glucose level fasting, after meals and with exercise. Acknowledge that pre and post exercise glucose checks will be done for 3 sessions at entry of program.   Core Components/Risk Factors/Patient Goals Review:    Core Components/Risk Factors/Patient Goals at Discharge (Final Review):    ITP Comments:  ITP Comments     Row Name 12/28/23 1620 01/04/24 1350 01/06/24 1045 01/27/24 1317 02/24/24 1111   ITP Comments Virtual Visit completed. Patient informed on EP and RD appointment  and 6 Minute walk test. Patient also informed of  patient health questionnaires on My Chart. Patient Verbalizes understanding. Visit diagnosis can be found in [facility] 11/20/2023. Completed and gym orientation for cardiac rehab. Initial ITP created and sent for review to Dr. Firman Hughes, Medical Director. First full day of exercise!  Patient was oriented to gym and equipment including functions, settings, policies, and procedures.  Patient's individual exercise prescription and treatment plan were reviewed.  All starting workloads were established based on the results of the 6 minute walk test done at initial orientation visit.  The plan for exercise progression was also introduced and progression will be customized based on patient's performance and goals. 30 Day review completed. Medical Director ITP review done, changes made as directed, and signed approval by Medical Director. New to program. 30 Day review completed. Medical Director ITP review done, changes made as directed, and signed approval by Medical Director.            Comments:

## 2024-02-24 NOTE — Progress Notes (Signed)
 Daily Session Note  Patient Details  Name: Sara Hodges MRN: [id number] Date of Birth: [birth year] Referring Provider:   Flowsheet Row Cardiac Rehab from 01/04/2024 in [facility] Cardiac and Pulmonary Rehab  Referring Provider Corbett Desanctis MD       Encounter Date: 02/24/2024  Check In:  Session Check In - 02/24/24 0926       Check-In   Supervising physician immediately available to respond to emergencies See telemetry face sheet for immediately available ER MD    Location [facility]-Cardiac & Pulmonary Rehab    Staff Present Sherle Dire, BS, Exercise Physiologist;Gabby Rackers RN,BSN,MPA;Maxon Conetta BS, Exercise Physiologist;Joseph Lacinda Pica RCP,RRT,BSRT    Virtual Visit No    Medication changes reported     No    Fall or balance concerns reported    No    Warm-up and Cool-down Performed on first and last piece of equipment    Resistance Training Performed Yes    VAD Patient? No    PAD/SET Patient? No      Pain Assessment   Currently in Pain? No/denies                Social History   Tobacco Use  Smoking Status Never  Smokeless Tobacco Never    Goals Met:  Independence with exercise equipment Exercise tolerated well No report of concerns or symptoms today Strength training completed today  Goals Unmet:  Not Applicable  Comments: Pt able to follow exercise prescription today without complaint.  Will continue to monitor for progression.    Dr. Firman Hughes is Medical Director for [facility] Cardiac Rehabilitation.  Dr. Fuad Aleskerov is Medical Director for [facility] Pulmonary Rehabilitation.

## 2024-02-25 ENCOUNTER — Encounter

## 2024-03-02 ENCOUNTER — Encounter: Admitting: *Deleted

## 2024-03-02 DIAGNOSIS — Z48812 Encounter for surgical aftercare following surgery on the circulatory system: Secondary | ICD-10-CM | POA: Diagnosis not present

## 2024-03-02 DIAGNOSIS — Z955 Presence of coronary angioplasty implant and graft: Secondary | ICD-10-CM

## 2024-03-02 NOTE — Progress Notes (Signed)
 Daily Session Note  Patient Details  Name: Sara Hodges MRN: [id number] Date of Birth: 1951-01-02 Referring Provider:   Flowsheet Row Cardiac Rehab from 01/04/2024 in [facility] Cardiac and Pulmonary Rehab  Referring Provider Corbett Desanctis MD       Encounter Date: 03/02/2024  Check In:  Session Check In - 03/02/24 0938       Check-In   Supervising physician immediately available to respond to emergencies See telemetry face sheet for immediately available ER MD    Location [facility]-Cardiac & Pulmonary Rehab    Staff Present Maud Sorenson, RN, BSN, CCRP;Meredith Manson Seitz RN,BSN;Kelly Bollinger RN,BSN,MPA;Noah Tickle, BS, Exercise Physiologist    Virtual Visit No    Medication changes reported     No    Fall or balance concerns reported    No    Warm-up and Cool-down Performed on first and last piece of equipment    Resistance Training Performed Yes    VAD Patient? No    PAD/SET Patient? No      Pain Assessment   Currently in Pain? No/denies                Social History   Tobacco Use  Smoking Status Never  Smokeless Tobacco Never    Goals Met:  Independence with exercise equipment Exercise tolerated well No report of concerns or symptoms today  Goals Unmet:  Not Applicable  Comments: Pt able to follow exercise prescription today without complaint.  Will continue to monitor for progression.    Dr. Firman Hughes is Medical Director for [facility] Cardiac Rehabilitation.  Dr. Fuad Aleskerov is Medical Director for [facility] Pulmonary Rehabilitation.

## 2024-03-03 ENCOUNTER — Encounter: Admitting: *Deleted

## 2024-03-03 DIAGNOSIS — Z955 Presence of coronary angioplasty implant and graft: Secondary | ICD-10-CM

## 2024-03-03 DIAGNOSIS — Z48812 Encounter for surgical aftercare following surgery on the circulatory system: Secondary | ICD-10-CM | POA: Diagnosis not present

## 2024-03-03 NOTE — Progress Notes (Signed)
 Daily Session Note  Patient Details  Name: Sara Hodges MRN: [id number] Date of Birth: 05/28/1951 Referring Provider:   Flowsheet Row Cardiac Rehab from 01/04/2024 in [facility] Cardiac and Pulmonary Rehab  Referring Provider Corbett Desanctis MD    Encounter Date: 03/03/2024  Check In:  Session Check In - 03/03/24 1725       Check-In   Supervising physician immediately available to respond to emergencies See telemetry face sheet for immediately available ER MD    Location [facility]-Cardiac & Pulmonary Rehab    Staff Present Sue Em RN,BSN;Margaret Best, MS, Exercise Physiologist;Maxon Conetta BS, Exercise Physiologist    Virtual Visit No    Medication changes reported     No    Fall or balance concerns reported    No    Warm-up and Cool-down Performed on first and last piece of equipment    Resistance Training Performed Yes    VAD Patient? No    PAD/SET Patient? No      Pain Assessment   Currently in Pain? No/denies             Social History   Tobacco Use  Smoking Status Never  Smokeless Tobacco Never    Goals Met:  Independence with exercise equipment Exercise tolerated well No report of concerns or symptoms today Strength training completed today  Goals Unmet:  Not Applicable  Comments: Pt able to follow exercise prescription today without complaint.  Will continue to monitor for progression.    Dr. Firman Hughes is Medical Director for [facility] Cardiac Rehabilitation.  Dr. Fuad Aleskerov is Medical Director for [facility] Pulmonary Rehabilitation.

## 2024-03-09 ENCOUNTER — Encounter: Admitting: *Deleted

## 2024-03-09 DIAGNOSIS — Z955 Presence of coronary angioplasty implant and graft: Secondary | ICD-10-CM

## 2024-03-09 DIAGNOSIS — Z48812 Encounter for surgical aftercare following surgery on the circulatory system: Secondary | ICD-10-CM | POA: Diagnosis not present

## 2024-03-09 NOTE — Progress Notes (Signed)
 Daily Session Note  Patient Details  Name: AILANI GOVERNALE MRN: [id number] Date of Birth: 1951-02-22 Referring Provider:   Flowsheet Row Cardiac Rehab from 01/04/2024 in [facility] Cardiac and Pulmonary Rehab  Referring Provider Corbett Desanctis MD    Encounter Date: 03/09/2024  Check In:  Session Check In - 03/09/24 0943       Check-In   Supervising physician immediately available to respond to emergencies See telemetry face sheet for immediately available ER MD    Location [facility]-Cardiac & Pulmonary Rehab    Staff Present Maud Sorenson, RN, BSN, CCRP;Maxon Conetta BS, Exercise Physiologist;Joseph Hood RCP,RRT,BSRT;Jason Martina Sledge RDN,LDN    Virtual Visit No    Medication changes reported     No    Fall or balance concerns reported    No    Warm-up and Cool-down Performed on first and last piece of equipment    Resistance Training Performed Yes    VAD Patient? No    PAD/SET Patient? No      Pain Assessment   Currently in Pain? No/denies             Social History   Tobacco Use  Smoking Status Never  Smokeless Tobacco Never    Goals Met:  Independence with exercise equipment Exercise tolerated well No report of concerns or symptoms today  Goals Unmet:  Not Applicable  Comments: Pt able to follow exercise prescription today without complaint.  Will continue to monitor for progression.   Reviewed home exercise with pt today.  Pt plans to walk for exercise.  Reviewed THR, pulse, RPE, sign and symptoms, pulse oximetery and when to call 911 or MD.  Also discussed weather considerations and indoor options.  Pt voiced understanding.   Dr. Firman Hughes is Medical Director for [facility] Cardiac Rehabilitation.  Dr. Fuad Aleskerov is Medical Director for [facility] Pulmonary Rehabilitation.

## 2024-03-10 ENCOUNTER — Encounter: Admitting: *Deleted

## 2024-03-10 DIAGNOSIS — Z48812 Encounter for surgical aftercare following surgery on the circulatory system: Secondary | ICD-10-CM | POA: Diagnosis not present

## 2024-03-10 DIAGNOSIS — Z955 Presence of coronary angioplasty implant and graft: Secondary | ICD-10-CM

## 2024-03-10 NOTE — Progress Notes (Signed)
 Daily Session Note  Patient Details  Name: DIVYA MUNSHI MRN: [id number] Date of Birth: 02-27-1951 Referring Provider:   Flowsheet Row Cardiac Rehab from 01/04/2024 in [facility] Cardiac and Pulmonary Rehab  Referring Provider Corbett Desanctis MD    Encounter Date: 03/10/2024  Check In:  Session Check In - 03/10/24 1725       Check-In   Supervising physician immediately available to respond to emergencies See telemetry face sheet for immediately available ER MD    Location [facility]-Cardiac & Pulmonary Rehab    Staff Present Sue Em RN,BSN;Joseph [facility] BS, Exercise Physiologist;Laureen Bevin [place], [place], RRT, CPFT    Virtual Visit No    Medication changes reported     No    Fall or balance concerns reported    No    Warm-up and Cool-down Performed on first and last piece of equipment    Resistance Training Performed Yes    VAD Patient? No    PAD/SET Patient? No      Pain Assessment   Currently in Pain? No/denies             Social History   Tobacco Use  Smoking Status Never  Smokeless Tobacco Never    Goals Met:  Independence with exercise equipment Exercise tolerated well No report of concerns or symptoms today Strength training completed today  Goals Unmet:  Not Applicable  Comments: Pt able to follow exercise prescription today without complaint.  Will continue to monitor for progression.    Dr. Firman Hughes is Medical Director for [facility] Cardiac Rehabilitation.  Dr. Fuad Aleskerov is Medical Director for [facility] Pulmonary Rehabilitation.

## 2024-03-16 ENCOUNTER — Encounter

## 2024-03-17 ENCOUNTER — Encounter

## 2024-03-23 ENCOUNTER — Encounter: Payer: Self-pay | Admitting: *Deleted

## 2024-03-23 ENCOUNTER — Encounter: Attending: Cardiovascular Disease

## 2024-03-23 DIAGNOSIS — Z955 Presence of coronary angioplasty implant and graft: Secondary | ICD-10-CM | POA: Insufficient documentation

## 2024-03-23 NOTE — Progress Notes (Signed)
 Daily Session Note  Patient Details  Name: Sara Hodges MRN: [id number] Date of Birth: [birth year] Referring Provider:   Flowsheet Row Cardiac Rehab from 01/04/2024 in [facility] Cardiac and Pulmonary Rehab  Referring Provider Clarinda Sharper MD    Encounter Date: 03/23/2024  Check In:  Session Check In - 03/23/24 0926       Check-In   Supervising physician immediately available to respond to emergencies See telemetry face sheet for immediately available ER MD    Location [facility]-Cardiac & Pulmonary Rehab    Staff Present Rollene Paterson, MS, Exercise Physiologist;Simmie Garin RN,BSN,MPA;Maxon Conetta BS, Exercise Physiologist;Joseph Rolinda RCP,RRT,BSRT    Virtual Visit No    Medication changes reported     No    Fall or balance concerns reported    No    Warm-up and Cool-down Performed on first and last piece of equipment    Resistance Training Performed Yes    VAD Patient? No    PAD/SET Patient? No      Pain Assessment   Currently in Pain? No/denies             Social History   Tobacco Use  Smoking Status Never  Smokeless Tobacco Never    Goals Met:  Independence with exercise equipment Exercise tolerated well No report of concerns or symptoms today Strength training completed today  Goals Unmet:  Not Applicable  Comments: Pt able to follow exercise prescription today without complaint.  Will continue to monitor for progression.    Dr. Oneil Pinal is Medical Director for [facility] Cardiac Rehabilitation.  Dr. Fuad Aleskerov is Medical Director for [facility] Pulmonary Rehabilitation.

## 2024-03-23 NOTE — Progress Notes (Signed)
 Cardiac Individual Treatment Plan  Patient Details  Name: Sara Hodges MRN: [id number] Date of Birth: 1951/08/28 Referring Provider:   Flowsheet Row Cardiac Rehab from 01/04/2024 in [facility] Cardiac and Pulmonary Rehab  Referring Provider Clarinda Sharper MD    Initial Encounter Date:  Flowsheet Row Cardiac Rehab from 01/04/2024 in [facility] Cardiac and Pulmonary Rehab  Date 01/04/24    Visit Diagnosis: Status post coronary artery stent placement  Patient's Home Medications on Admission:  Current Outpatient Medications:    acetaminophen  (TYLENOL ) 500 MG tablet, Take by mouth., Disp: , Rfl:    aspirin  EC 81 MG tablet, Take 81 mg by mouth daily. , Disp: , Rfl:    celecoxib (CELEBREX) 200 MG capsule, , Disp: , Rfl:    clopidogrel (PLAVIX) 75 MG tablet, Take 75 mg by mouth daily., Disp: , Rfl:    ENTRESTO  49-51 MG, Take 1 tablet by mouth 2 (two) times daily. (Patient not taking: Reported on 12/28/2023), Disp: , Rfl:    ENTRESTO  97-103 MG, Take 1 tablet by mouth 2 (two) times daily., Disp: , Rfl:    folic acid  (FOLVITE ) 1 MG tablet, Take 1 mg by mouth daily. (Patient not taking: Reported on 12/28/2023), Disp: , Rfl:    folic acid  (FOLVITE ) 1 MG tablet, Take by mouth., Disp: , Rfl:    furosemide  (LASIX ) 20 MG tablet, Take 20 mg by mouth daily as needed for fluid., Disp: , Rfl:    levocetirizine (XYZAL) 5 MG tablet, Take 1 tablet by mouth every evening., Disp: , Rfl:    metoprolol succinate (TOPROL-XL) 25 MG 24 hr tablet, , Disp: , Rfl:    metoprolol succinate (TOPROL-XL) 25 MG 24 hr tablet, Take by mouth., Disp: , Rfl:    nitroGLYCERIN (NITROSTAT) 0.4 MG SL tablet, Place under the tongue., Disp: , Rfl:    pantoprazole (PROTONIX) 40 MG tablet, Take 40 mg by mouth daily., Disp: , Rfl:    polyethylene glycol powder (GLYCOLAX/MIRALAX) 17 GM/SCOOP powder, Take by mouth. (Patient not taking: Reported on 12/28/2023), Disp: , Rfl:    predniSONE (DELTASONE) 10 MG tablet, 2 tabs daily for 4 days, 1 tab daily for  4 days, Disp: , Rfl:    spironolactone  (ALDACTONE ) 25 MG tablet, Take 25 mg by mouth daily. (Patient not taking: Reported on 12/28/2023), Disp: , Rfl:    triamcinolone ointment (KENALOG) 0.1 %, Apply twice daily to raised itchy areas until smooth (typically 5-10 days) (Patient not taking: Reported on 12/28/2023), Disp: , Rfl:    Vitamin D , Ergocalciferol , (DRISDOL ) 50000 UNITS CAPS capsule, Take 50,000 Units by mouth every 7 (seven) days. Takes on Sunday, Disp: , Rfl:   Past Medical History: Past Medical History:  Diagnosis Date   CAD (coronary artery disease)    CHF (congestive heart failure) (HCC)    HLD (hyperlipidemia)    Hypertension    Pancreatic cyst    Pancreatic mass     Tobacco Use: Social History   Tobacco Use  Smoking Status Never  Smokeless Tobacco Never    Labs: Review Flowsheet        No data to display           Exercise Target Goals: Exercise Program Goal: Individual exercise prescription set using results from initial 6 min walk test and THRR while considering  patient's activity barriers and safety.   Exercise Prescription Goal: Initial exercise prescription builds to 30-45 minutes a day of aerobic activity, 2-3 days per week.  Home exercise guidelines will be given  to patient during program as part of exercise prescription that the participant will acknowledge.   Education: Aerobic Exercise: - Group verbal and visual presentation on the components of exercise prescription. Introduces F.I.T.T principle from ACSM for exercise prescriptions.  Reviews F.I.T.T. principles of aerobic exercise including progression. Written material given at graduation.   Education: Resistance Exercise: - Group verbal and visual presentation on the components of exercise prescription. Introduces F.I.T.T principle from ACSM for exercise prescriptions  Reviews F.I.T.T. principles of resistance exercise including progression. Written material given at graduation.    Education:  Exercise & Equipment Safety: - Individual verbal instruction and demonstration of equipment use and safety with use of the equipment. Flowsheet Row Cardiac Rehab from 03/23/2024 in [facility] Cardiac and Pulmonary Rehab  Date 01/04/24  Educator MB  Instruction Review Code 1- Verbalizes Understanding    Education: Exercise Physiology & General Exercise Guidelines: - Group verbal and written instruction with models to review the exercise physiology of the cardiovascular system and associated critical values. Provides general exercise guidelines with specific guidelines to those with heart or lung disease.    Education: Flexibility, Balance, Mind/Body Relaxation: - Group verbal and visual presentation with interactive activity on the components of exercise prescription. Introduces F.I.T.T principle from ACSM for exercise prescriptions. Reviews F.I.T.T. principles of flexibility and balance exercise training including progression. Also discusses the mind body connection.  Reviews various relaxation techniques to help reduce and manage stress (i.e. Deep breathing, progressive muscle relaxation, and visualization). Balance handout provided to take home. Written material given at graduation.   Activity Barriers & Risk Stratification:  Activity Barriers & Cardiac Risk Stratification - 01/04/24 1352       Activity Barriers & Cardiac Risk Stratification   Activity Barriers Back Problems;Other (comment)    Comments L leg aches (mainly at night), occasional low and mid back pain due to degenerative disc disease    Cardiac Risk Stratification Moderate          6 Minute Walk:  6 Minute Walk     Row Name 01/04/24 1350         6 Minute Walk   Phase Initial     Distance 1390 feet     Walk Time 6 minutes     # of Rest Breaks 0     MPH 2.63     METS 2.83     RPE 9     Perceived Dyspnea  0     VO2 Peak 9.89     Symptoms No     Resting HR 69 bpm     Resting BP 108/70     Resting Oxygen Saturation   99 %     Exercise Oxygen Saturation  during 6 min walk 100 %     Max Ex. HR 90 bpm     Max Ex. BP 146/68     2 Minute Post BP 118/66        Oxygen Initial Assessment:   Oxygen Re-Evaluation:   Oxygen Discharge (Final Oxygen Re-Evaluation):   Initial Exercise Prescription:  Initial Exercise Prescription - 01/04/24 1300       Date of Initial Exercise RX and Referring Provider   Date 01/04/24    Referring Provider Clarinda Sharper MD      Oxygen   Maintain Oxygen Saturation 88% or higher      Recumbant Bike   Level 2    RPM 50    Watts 20    Minutes 15    METs  2.83      NuStep   Level 2    SPM 80    Minutes 15    METs 2.83      REL-XR   Level 1    Speed 50    Minutes 15    METs 2.83      T5 Nustep   Level 2    SPM 80    Minutes 15    METs 2.83      Track   Laps 33    Minutes 15    METs 2.79      Prescription Details   Frequency (times per week) 2    Duration Progress to 30 minutes of continuous aerobic without signs/symptoms of physical distress      Intensity   THRR 40-80% of Max Heartrate 100-131    Ratings of Perceived Exertion 11-13    Perceived Dyspnea 0-4      Progression   Progression Continue to progress workloads to maintain intensity without signs/symptoms of physical distress.      Resistance Training   Training Prescription Yes    Weight 4 lb    Reps 10-15          Perform Capillary Blood Glucose checks as needed.  Exercise Prescription Changes:   Exercise Prescription Changes     Row Name 01/04/24 1300 01/14/24 1800 01/26/24 1500 02/11/24 1800 02/25/24 1600     Response to Exercise   Blood Pressure (Admit) 108/70 102/58 102/60 134/62 122/60   Blood Pressure (Exercise) 146/68 130/60 148/62 144/58 122/58   Blood Pressure (Exit) 118/66 108/60 110/62 118/70 110/62   Heart Rate (Admit) 69 bpm 82 bpm 77 bpm 72 bpm 79 bpm   Heart Rate (Exercise) 90 bpm 90 bpm 116 bpm 116 bpm 115 bpm   Heart Rate (Exit) 69 bpm 74 bpm 93  bpm 85 bpm 89 bpm   Oxygen Saturation (Admit) 99 % -- -- -- --   Oxygen Saturation (Exercise) 100 % -- -- -- --   Oxygen Saturation (Exit) 100 % -- -- -- --   Rating of Perceived Exertion (Exercise) 9 11 13 13 14    Perceived Dyspnea (Exercise) 0 -- -- -- --   Symptoms none none none none none   Comments results 1st full day of exercise -- -- --   Duration Progress to 30 minutes of  aerobic without signs/symptoms of physical distress Continue with 30 min of aerobic exercise without signs/symptoms of physical distress. Continue with 30 min of aerobic exercise without signs/symptoms of physical distress. Continue with 30 min of aerobic exercise without signs/symptoms of physical distress. Continue with 30 min of aerobic exercise without signs/symptoms of physical distress.   Intensity THRR New THRR unchanged THRR unchanged THRR unchanged THRR unchanged     Progression   Progression Continue to progress workloads to maintain intensity without signs/symptoms of physical distress. Continue to progress workloads to maintain intensity without signs/symptoms of physical distress. Continue to progress workloads to maintain intensity without signs/symptoms of physical distress. Continue to progress workloads to maintain intensity without signs/symptoms of physical distress. Continue to progress workloads to maintain intensity without signs/symptoms of physical distress.   Average METs 2.83 3.38 3.07 3.17 3.38     Resistance Training   Training Prescription -- Yes Yes Yes Yes   Weight -- 4 lb 5 lb 5 lb 5 lb   Reps -- 10-15 10-15 10-15 10-15     Interval Training   Interval Training -- No No  No No     Treadmill   MPH -- -- 2.5 2.6 --   Grade -- -- 0 0.5 --   Minutes -- -- 15 15 --   METs -- -- 2.91 3.17 --     Recumbant Bike   Level -- -- 5 5 8    Watts -- -- 39 20 57   Minutes -- -- 15 15 15    METs -- -- 2.81 2.8 4.28     NuStep   Level -- 2 4 4 4    Minutes -- 15 15 15 15    METs -- 5 4  4.4 3.9     REL-XR   Level -- -- 6 -- --   Minutes -- -- 15 -- --   METs -- -- 4.5 -- --     T5 Nustep   Level -- -- 3 3 2    Minutes -- -- 15 15 15    METs -- -- 2.2 2.3 3     Track   Laps -- 14 14 -- --   Minutes -- 15 15 -- --   METs -- 1.76 1.76 -- --     Oxygen   Maintain Oxygen Saturation -- 88% or higher 88% or higher 88% or higher 88% or higher    Row Name 03/09/24 0900 03/10/24 0900           Response to Exercise   Blood Pressure (Admit) -- 116/54      Blood Pressure (Exit) -- 106/58      Heart Rate (Admit) -- 74 bpm      Heart Rate (Exercise) -- 115 bpm      Heart Rate (Exit) 89 bpm 88 bpm      Rating of Perceived Exertion (Exercise) -- 13      Symptoms -- none      Duration Continue with 30 min of aerobic exercise without signs/symptoms of physical distress. Continue with 30 min of aerobic exercise without signs/symptoms of physical distress.      Intensity THRR unchanged THRR unchanged        Progression   Progression Continue to progress workloads to maintain intensity without signs/symptoms of physical distress. Continue to progress workloads to maintain intensity without signs/symptoms of physical distress.      Average METs 3.38 3.61        Resistance Training   Training Prescription Yes Yes      Weight 5 lb 5 lb      Reps 10-15 10-15        Interval Training   Interval Training No No        Recumbant Bike   Level 8 8      Watts 57 54      Minutes 15 15      METs 4.28 4.32        NuStep   Level 4 4      Minutes 15 15      METs 3.9 4.6        T5 Nustep   Level 2 4      Minutes 15 15      METs 3 2.2        Home Exercise Plan   Plans to continue exercise at Home (comment)  walk Home (comment)  walk      Frequency Add 2 additional days to program exercise sessions. Add 2 additional days to program exercise sessions.      Initial Home Exercises Provided 03/09/24 03/09/24  Oxygen   Maintain Oxygen Saturation 88% or higher 88% or  higher         Exercise Comments:   Exercise Comments     Row Name 01/06/24 1045           Exercise Comments First full day of exercise!  Patient was oriented to gym and equipment including functions, settings, policies, and procedures.  Patient's individual exercise prescription and treatment plan were reviewed.  All starting workloads were established based on the results of the 6 minute walk test done at initial orientation visit.  The plan for exercise progression was also introduced and progression will be customized based on patient's performance and goals.          Exercise Goals and Review:   Exercise Goals     Row Name 01/04/24 1356             Exercise Goals   Increase Physical Activity Yes       Intervention Provide advice, education, support and counseling about physical activity/exercise needs.;Develop an individualized exercise prescription for aerobic and resistive training based on initial evaluation findings, risk stratification, comorbidities and participant's personal goals.       Expected Outcomes Short Term: Attend rehab on a regular basis to increase amount of physical activity.;Long Term: Add in home exercise to make exercise part of routine and to increase amount of physical activity.;Long Term: Exercising regularly at least 3-5 days a week.       Increase Strength and Stamina Yes       Intervention Provide advice, education, support and counseling about physical activity/exercise needs.;Develop an individualized exercise prescription for aerobic and resistive training based on initial evaluation findings, risk stratification, comorbidities and participant's personal goals.       Expected Outcomes Short Term: Increase workloads from initial exercise prescription for resistance, speed, and METs.;Short Term: Perform resistance training exercises routinely during rehab and add in resistance training at home;Long Term: Improve cardiorespiratory fitness, muscular  endurance and strength as measured by increased METs and functional capacity ( )       Able to understand and use rate of perceived exertion (RPE) scale Yes       Intervention Provide education and explanation on how to use RPE scale       Expected Outcomes Short Term: Able to use RPE daily in rehab to express subjective intensity level;Long Term:  Able to use RPE to guide intensity level when exercising independently       Able to understand and use Dyspnea scale Yes       Intervention Provide education and explanation on how to use Dyspnea scale       Expected Outcomes Short Term: Able to use Dyspnea scale daily in rehab to express subjective sense of shortness of breath during exertion;Long Term: Able to use Dyspnea scale to guide intensity level when exercising independently       Knowledge and understanding of Target Heart Rate Range (THRR) Yes       Intervention Provide education and explanation of THRR including how the numbers were predicted and where they are located for reference       Expected Outcomes Short Term: Able to state/look up THRR;Short Term: Able to use daily as guideline for intensity in rehab;Long Term: Able to use THRR to govern intensity when exercising independently       Able to check pulse independently Yes       Intervention Provide education and demonstration on how to check  pulse in carotid and radial arteries.;Review the importance of being able to check your own pulse for safety during independent exercise       Expected Outcomes Short Term: Able to explain why pulse checking is important during independent exercise;Long Term: Able to check pulse independently and accurately       Understanding of Exercise Prescription Yes       Intervention Provide education, explanation, and written materials on patient's individual exercise prescription       Expected Outcomes Short Term: Able to explain program exercise prescription;Long Term: Able to explain home exercise  prescription to exercise independently          Exercise Goals Re-Evaluation :  Exercise Goals Re-Evaluation     Row Name 01/06/24 1045 01/14/24 1805 01/26/24 1527 02/11/24 1813 02/25/24 1629     Exercise Goal Re-Evaluation   Exercise Goals Review Able to understand and use rate of perceived exertion (RPE) scale;Knowledge and understanding of Target Heart Rate Range (THRR);Understanding of Exercise Prescription;Able to understand and use Dyspnea scale Increase Physical Activity;Understanding of Exercise Prescription;Increase Strength and Stamina Increase Physical Activity;Understanding of Exercise Prescription;Increase Strength and Stamina Increase Physical Activity;Understanding of Exercise Prescription;Increase Strength and Stamina Increase Physical Activity;Understanding of Exercise Prescription;Increase Strength and Stamina   Comments Reviewed RPE and dyspnea scale, THR and program prescription with pt today.  Pt voiced understanding and was given a copy of goals to take home. Maycee completed her first exercise session in this review. She was able to do level 2 on the T4 nustep and 14 laps on the track. We will continue to monitor her progress in the program. Casondra is doing well in the rehab. She improved to level 3 on the T5 nustep, level 4 on the T4 nustep, level 5 on the recumbent bike, and level 6 on the XR. She also began using the treadmill at a speed of 2.5 mph with no incline. We will continue to monitor her progress in the program. Kaylina is doing well in the rehab. She increased her workload on the treadmill to a speed of 2.6 mph and incline of 0.5%. She maintained level 4 on the T4 nustep, level 3 on the T5 nustep, and level 5 on the recumbent bike. We will continue to monitor her progress in the program. Thia continues to do well in rehab. She recently improved to level 8 on the recumbent bike. She has not done any walking on the treadmill since the last review. We will continue to monitor  her progress in the program.   Expected Outcomes Short: Use RPE daily to regulate intensity. Long: Follow program prescription in THR. Short: Continue to follow current exercise prescription. Long: Continue exercise to improve strength and stamina. Short: Continue to progressively increase treadmill workload. Long: Continue exercise to improve strength and stamina. Short: Try level 4 on the T5 nustep. Long: Continue exercise to improve strength and stamina. Short: Return to walking on the treadmill in the program. Long: Continue exercise to improve strength and stamina.    Row Name 03/09/24 0954 03/10/24 0924           Exercise Goal Re-Evaluation   Exercise Goals Review Increase Physical Activity;Able to understand and use rate of perceived exertion (RPE) scale;Knowledge and understanding of Target Heart Rate Range (THRR);Understanding of Exercise Prescription;Increase Strength and Stamina;Able to understand and use Dyspnea scale;Able to check pulse independently Increase Physical Activity;Increase Strength and Stamina;Understanding of Exercise Prescription      Comments Reviewed home exercise with pt  today.  Pt plans to walk for exercise.  Reviewed THR, pulse, RPE, sign and symptoms, pulse oximetery and when to call 911 or MD.  Also discussed weather considerations and indoor options.  Pt voiced understanding. Tyera is doing well in rehab. She recently improved to level 4 on the T5 nustep, and continued to work at level 8 on the recumbent bike. She has not done any walking on the treadmill since the last review. We will continue to monitor her progress in the program.      Expected Outcomes Short: add 1-2 days a week of home exercise on off days of cardiac rehab. Long: maintain independent exercise routine upon graduation from cardiac rehab. Short: Return to walking the treadmill in rehab. Long: Continue exercise to improve strength and stamina.         Discharge Exercise Prescription (Final Exercise  Prescription Changes):  Exercise Prescription Changes - 03/10/24 0900       Response to Exercise   Blood Pressure (Admit) 116/54    Blood Pressure (Exit) 106/58    Heart Rate (Admit) 74 bpm    Heart Rate (Exercise) 115 bpm    Heart Rate (Exit) 88 bpm    Rating of Perceived Exertion (Exercise) 13    Symptoms none    Duration Continue with 30 min of aerobic exercise without signs/symptoms of physical distress.    Intensity THRR unchanged      Progression   Progression Continue to progress workloads to maintain intensity without signs/symptoms of physical distress.    Average METs 3.61      Resistance Training   Training Prescription Yes    Weight 5 lb    Reps 10-15      Interval Training   Interval Training No      Recumbant Bike   Level 8    Watts 54    Minutes 15    METs 4.32      NuStep   Level 4    Minutes 15    METs 4.6      T5 Nustep   Level 4    Minutes 15    METs 2.2      Home Exercise Plan   Plans to continue exercise at Home (comment)   walk   Frequency Add 2 additional days to program exercise sessions.    Initial Home Exercises Provided 03/09/24      Oxygen   Maintain Oxygen Saturation 88% or higher          Nutrition:  Target Goals: Understanding of nutrition guidelines, daily intake of sodium 1500mg , cholesterol 200mg , calories 30% from fat and 7% or less from saturated fats, daily to have 5 or more servings of fruits and vegetables.  Education: All About Nutrition: -Group instruction provided by verbal, written material, interactive activities, discussions, models, and posters to present general guidelines for heart healthy nutrition including fat, fiber, MyPlate, the role of sodium in heart healthy nutrition, utilization of the nutrition label, and utilization of this knowledge for meal planning. Follow up email sent as well. Written material given at graduation. Flowsheet Row Cardiac Rehab from 03/23/2024 in [facility] Cardiac and Pulmonary Rehab   Date 03/02/24  Educator jg-part 2  Instruction Review Code 1- Verbalizes Understanding    Biometrics:  Pre Biometrics - 01/04/24 1357       Pre Biometrics   Height 5' 5.39 (1.661 m)    Weight 171 lb 6.4 oz (77.7 kg)    Waist Circumference 33 inches  Hip Circumference 43.5 inches    Waist to Hip Ratio 0.76 %    BMI (Calculated) 28.18    Single Leg Stand 30 seconds           Nutrition Therapy Plan and Nutrition Goals:  Nutrition Therapy & Goals - 01/06/24 1115       Nutrition Therapy   Diet Cardiac, Low Na    Protein (specify units) 80g    Fiber 25 grams    Whole Grain Foods 3 servings    Saturated Fats 15 max. grams    Fruits and Vegetables 5 servings/day    Sodium 2 grams      Personal Nutrition Goals   Nutrition Goal Read labels and reduce sodium intake to below 2300mg . Ideally 1500mg  per day.    Personal Goal #2 Eat 15-30gProtein and 30-60gCarbs at each meal.    Personal Goal #3 Reduce saturated fat, less than 12g per day. Replace bad fats for more heart healthy fats.    Comments Patient drinking mostly water but sometimes has a sweet beverage. She reports she has been working on cutting back on the sugary beverages. She reports eating healthy choices mostly, says sometimes she snacks at night on popcorn with butter or cookies. Sometimes she might cook eggs and bacon for breakfast on weekends but not every week. Encouraged her to enjoy comfort foods from time to time, but should not be regular or routine. Reviewed mediterranean diet handout. Educated on types of fats, sources, how to read labels. Provided guidelines on sodium and saturated fat, recommending she stay below 1500mg  sodium and 15gSat fat. Brainstormed some meal and snack ideas with foods she likes and will eat focusing on balanced plates with adequate protein and controlled sodium.      Intervention Plan   Intervention Prescribe, educate and counsel regarding individualized specific dietary modifications  aiming towards targeted core components such as weight, hypertension, lipid management, diabetes, heart failure and other comorbidities.;Nutrition handout(s) given to patient.    Expected Outcomes Short Term Goal: Understand basic principles of dietary content, such as calories, fat, sodium, cholesterol and nutrients.;Short Term Goal: A plan has been developed with personal nutrition goals set during dietitian appointment.;Long Term Goal: Adherence to prescribed nutrition plan.          Nutrition Assessments:  MEDIFICTS Score Key: >=70 Need to make dietary changes  40-70 Heart Healthy Diet <= 40 Therapeutic Level Cholesterol Diet  Flowsheet Row Cardiac Rehab from 01/13/2024 in [facility] Cardiac and Pulmonary Rehab  Picture Your Plate Total Score on Admission 61   Picture Your Plate Scores: <59 Unhealthy dietary pattern with much room for improvement. 41-50 Dietary pattern unlikely to meet recommendations for good health and room for improvement. 51-60 More healthful dietary pattern, with some room for improvement.  >60 Healthy dietary pattern, although there may be some specific behaviors that could be improved.    Nutrition Goals Re-Evaluation:  Nutrition Goals Re-Evaluation     Row Name 03/09/24 0956             Goals   Comment Star reports that she is reading food labels and being mindful of what she is eating. She mostly is trying to eat lean protein and fresh vegetables to make sure she has balanced meals. She is also making an effort to drink more water.       Expected Outcome Short: continue to get comfortable with reading food labels. Long: maintain heart healthy diet long term to help control cardiac risk factors.  Nutrition Goals Discharge (Final Nutrition Goals Re-Evaluation):  Nutrition Goals Re-Evaluation - 03/09/24 0956       Goals   Comment Larayne reports that she is reading food labels and being mindful of what she is eating. She mostly is trying to eat  lean protein and fresh vegetables to make sure she has balanced meals. She is also making an effort to drink more water.    Expected Outcome Short: continue to get comfortable with reading food labels. Long: maintain heart healthy diet long term to help control cardiac risk factors.          Psychosocial: Target Goals: Acknowledge presence or absence of significant depression and/or stress, maximize coping skills, provide positive support system. Participant is able to verbalize types and ability to use techniques and skills needed for reducing stress and depression.   Education: Stress, Anxiety, and Depression - Group verbal and visual presentation to define topics covered.  Reviews how body is impacted by stress, anxiety, and depression.  Also discusses healthy ways to reduce stress and to treat/manage anxiety and depression.  Written material given at graduation. Flowsheet Row Cardiac Rehab from 03/23/2024 in [facility] Cardiac and Pulmonary Rehab  Date 01/27/24  Educator [facility]  Instruction Review Code 1- [employer] Understanding    Education: Sleep Hygiene -Provides group verbal and written instruction about how sleep can affect your health.  Define sleep hygiene, discuss sleep cycles and impact of sleep habits. Review good sleep hygiene tips.    Initial Review & Psychosocial Screening:  Initial Psych Review & Screening - 12/28/23 1617       Initial Review   Current issues with None Identified      Family Dynamics   Good Support System? Yes    Comments Valla can look to her friends, significant other and daughter for support. She states no depression or anxiety and does not take any medications for her mood.      Barriers   Psychosocial barriers to participate in program There are no identifiable barriers or psychosocial needs.;The patient should benefit from training in stress management and relaxation.      Screening Interventions   Interventions Encouraged to exercise;Provide  feedback about the scores to participant;To provide support and resources with identified psychosocial needs    Expected Outcomes Short Term goal: Utilizing psychosocial counselor, staff and physician to assist with identification of specific Stressors or current issues interfering with healing process. Setting desired goal for each stressor or current issue identified.;Long Term Goal: Stressors or current issues are controlled or eliminated.;Short Term goal: Identification and review with participant of any Quality of Life or Depression concerns found by scoring the questionnaire.;Long Term goal: The participant improves quality of Life and PHQ9 Scores as seen by post scores and/or verbalization of changes          Quality of Life Scores:   Quality of Life - 01/13/24 1045       Quality of Life   Select Quality of Life      Quality of Life Scores   Health/Function Pre 25.86 %    Socioeconomic Pre 22.56 %    Psych/Spiritual Pre 25.75 %    Family Pre 24.9 %    GLOBAL Pre 24.89 %         Scores of 19 and below usually indicate a poorer quality of life in these areas.  A difference of  2-3 points is a clinically meaningful difference.  A difference of 2-3 points in the total score  of the Quality of Life Index has been associated with significant improvement in overall quality of life, self-image, physical symptoms, and general health in studies assessing change in quality of life.  PHQ-9: Review Flowsheet       01/04/2024 04/22/2016  Depression screen PHQ 2/9  Decreased Interest 0 0  Down, Depressed, Hopeless 0 0  PHQ - 2 Score 0 0  Altered sleeping 1 0  Tired, decreased energy 0 1  Change in appetite 0 1  Feeling bad or failure about yourself  0 0  Trouble concentrating 0 0  Moving slowly or fidgety/restless 0 0  Suicidal thoughts 0 0   PHQ-9 Score 1 2  Difficult doing work/chores Not difficult at all Not difficult at all    Details       Data saved with a previous flowsheet  row definition        Interpretation of Total Score  Total Score Depression Severity:  1-4 = Minimal depression, 5-9 = Mild depression, 10-14 = Moderate depression, 15-19 = Moderately severe depression, 20-27 = Severe depression   Psychosocial Evaluation and Intervention:  Psychosocial Evaluation - 12/28/23 1618       Psychosocial Evaluation & Interventions   Interventions Encouraged to exercise with the program and follow exercise prescription;Relaxation education;Stress management education    Comments Ramona can look to her friends, significant other and daughter for support. She states no depression or anxiety and does not take any medications for her mood.    Expected Outcomes Short: Start [facility] to help with mood. Long: Maintain a healthy mental state    Continue Psychosocial Services  Follow up required by staff          Psychosocial Re-Evaluation:  Psychosocial Re-Evaluation     Row Name 03/09/24 1000             Psychosocial Re-Evaluation   Current issues with None Identified       Comments Zain reports that she has no concerns with stress or mental health effecting her ability to function in life. She does report that she does not sleep well all the time. She has a good support system and healthy ways of managing stress.       Expected Outcomes Short: continue to attend cardiac rehab for mental health benefits of exercise. Long: maintain good mental health routine.       Interventions Encouraged to attend Cardiac Rehabilitation for the exercise       Continue Psychosocial Services  Follow up required by staff          Psychosocial Discharge (Final Psychosocial Re-Evaluation):  Psychosocial Re-Evaluation - 03/09/24 1000       Psychosocial Re-Evaluation   Current issues with None Identified    Comments Bracha reports that she has no concerns with stress or mental health effecting her ability to function in life. She does report that she does not sleep well  all the time. She has a good support system and healthy ways of managing stress.    Expected Outcomes Short: continue to attend cardiac rehab for mental health benefits of exercise. Long: maintain good mental health routine.    Interventions Encouraged to attend Cardiac Rehabilitation for the exercise    Continue Psychosocial Services  Follow up required by staff          Vocational Rehabilitation: Provide vocational rehab assistance to qualifying candidates.   Vocational Rehab Evaluation & Intervention:   Education: Education Goals: Education classes will be provided on a  variety of topics geared toward better understanding of heart health and risk factor modification. Participant will state understanding/return demonstration of topics presented as noted by education test scores.  Learning Barriers/Preferences:  Learning Barriers/Preferences - 12/28/23 1614       Learning Barriers/Preferences   Learning Barriers None    Learning Preferences Group Instruction;Individual Instruction;Pictoral;Skilled Demonstration;Verbal Instruction;Video;Written Material          General Cardiac Education Topics:  AED/CPR: - Group verbal and written instruction with the use of models to demonstrate the basic use of the AED with the basic ABC's of resuscitation.   Anatomy and Cardiac Procedures: - Group verbal and visual presentation and models provide information about basic cardiac anatomy and function. Reviews the testing methods done to diagnose heart disease and the outcomes of the test results. Describes the treatment choices: Medical Management, Angioplasty, or Coronary Bypass Surgery for treating various heart conditions including Myocardial Infarction, Angina, Valve Disease, and Cardiac Arrhythmias.  Written material given at graduation. Flowsheet Row Cardiac Rehab from 03/23/2024 in [facility] Cardiac and Pulmonary Rehab  Date 03/09/24  Educator SB  Instruction Review Code 1- Verbalizes  Understanding    Medication Safety: - Group verbal and visual instruction to review commonly prescribed medications for heart and lung disease. Reviews the medication, class of the drug, and side effects. Includes the steps to properly store meds and maintain the prescription regimen.  Written material given at graduation.   Intimacy: - Group verbal instruction through game format to discuss how heart and lung disease can affect sexual intimacy. Written material given at graduation..   Know Your Numbers and Heart Failure: - Group verbal and visual instruction to discuss disease risk factors for cardiac and pulmonary disease and treatment options.  Reviews associated critical values for Overweight/Obesity, Hypertension, Cholesterol, and Diabetes.  Discusses basics of heart failure: signs/symptoms and treatments.  Introduces Heart Failure Zone chart for action plan for heart failure.  Written material given at graduation. Flowsheet Row Cardiac Rehab from 03/23/2024 in [facility] Cardiac and Pulmonary Rehab  Date 01/13/24  Educator [facility]  Instruction Review Code 1- Verbalizes Understanding    Infection Prevention: - Provides verbal and written material to individual with discussion of infection control including proper hand washing and proper equipment cleaning during exercise session. Flowsheet Row Cardiac Rehab from 03/23/2024 in [facility] Cardiac and Pulmonary Rehab  Date 01/04/24  Educator MB  Instruction Review Code 1- Verbalizes Understanding    Falls Prevention: - Provides verbal and written material to individual with discussion of falls prevention and safety. Flowsheet Row Cardiac Rehab from 03/23/2024 in [facility] Cardiac and Pulmonary Rehab  Date 01/04/24  Educator MB  Instruction Review Code 1- Verbalizes Understanding    Other: -Provides group and verbal instruction on various topics (see comments)   Knowledge Questionnaire Score:  Knowledge Questionnaire Score - 01/13/24 1045        Knowledge Questionnaire Score   Pre Score 25/26          Core Components/Risk Factors/Patient Goals at Admission:  Personal Goals and Risk Factors at Admission - 01/04/24 1359       Core Components/Risk Factors/Patient Goals on Admission    Weight Management Yes;Weight Loss    Intervention Weight Management: Develop a combined nutrition and exercise program designed to reach desired caloric intake, while maintaining appropriate intake of nutrient and fiber, sodium and fats, and appropriate energy expenditure required for the weight goal.;Weight Management: Provide education and appropriate resources to help participant work on and attain dietary goals.;Weight  Management/Obesity: Establish reasonable short term and long term weight goals.    Admit Weight 171 lb 6.4 oz (77.7 kg)    Goal Weight: Short Term 158 lb 3.2 oz (71.8 kg)    Goal Weight: Long Term 145 lb (65.8 kg)    Expected Outcomes Short Term: Continue to assess and modify interventions until short term weight is achieved;Long Term: Adherence to nutrition and physical activity/exercise program aimed toward attainment of established weight goal;Understanding recommendations for meals to include 15-35% energy as protein, 25-35% energy from fat, 35-60% energy from carbohydrates, less than 200mg  of dietary cholesterol, 20-35 gm of total fiber daily;Understanding of distribution of calorie intake throughout the day with the consumption of 4-5 meals/snacks;Weight Loss: Understanding of general recommendations for a balanced deficit meal plan, which promotes 1-2 lb weight loss per week and includes a negative energy balance of [phone] kcal/d    Heart Failure Yes    Intervention Provide a combined exercise and nutrition program that is supplemented with education, support and counseling about heart failure. Directed toward relieving symptoms such as shortness of breath, decreased exercise tolerance, and extremity edema.    Expected Outcomes  Improve functional capacity of life;Short term: Attendance in program 2-3 days a week with increased exercise capacity. Reported lower sodium intake. Reported increased fruit and vegetable intake. Reports medication compliance.;Short term: Daily weights obtained and reported for increase. Utilizing diuretic protocols set by physician.;Long term: Adoption of self-care skills and reduction of barriers for early signs and symptoms recognition and intervention leading to self-care maintenance.    Hypertension Yes    Intervention Provide education on lifestyle modifcations including regular physical activity/exercise, weight management, moderate sodium restriction and increased consumption of fresh fruit, vegetables, and low fat dairy, alcohol moderation, and smoking cessation.;Monitor prescription use compliance.    Expected Outcomes Short Term: Continued assessment and intervention until BP is < 140/11mm HG in hypertensive participants. < 130/47mm HG in hypertensive participants with diabetes, heart failure or chronic kidney disease.;Long Term: Maintenance of blood pressure at goal levels.    Lipids Yes    Intervention Provide education and support for participant on nutrition & aerobic/resistive exercise along with prescribed medications to achieve LDL 70mg , HDL >40mg .    Expected Outcomes Short Term: Participant states understanding of desired cholesterol values and is compliant with medications prescribed. Participant is following exercise prescription and nutrition guidelines.;Long Term: Cholesterol controlled with medications as prescribed, with individualized exercise RX and with personalized nutrition plan. Value goals: LDL < 70mg , HDL > 40 mg.          Education:Diabetes - Individual verbal and written instruction to review signs/symptoms of diabetes, desired ranges of glucose level fasting, after meals and with exercise. Acknowledge that pre and post exercise glucose checks will be done for 3  sessions at entry of program.   Core Components/Risk Factors/Patient Goals Review:   Goals and Risk Factor Review     Row Name 03/09/24 0958             Core Components/Risk Factors/Patient Goals Review   Personal Goals Review Weight Management/Obesity;Heart Failure;Lipids       Review Georgette stated that her weight has been steady. She still wants to work towards her weight loss goal, but the importance of weighing daily and monitoring for symptoms of heart failure was reviewed. She continues to take all her cholesterol medication as well and follows up with her doctor for regular appointments and lab work.       Expected Outcomes Short:  weight daily and monitor for signs of heart failure. Long: continue to control cardiac risk factors.          Core Components/Risk Factors/Patient Goals at Discharge (Final Review):   Goals and Risk Factor Review - 03/09/24 0958       Core Components/Risk Factors/Patient Goals Review   Personal Goals Review Weight Management/Obesity;Heart Failure;Lipids    Review Nyiah stated that her weight has been steady. She still wants to work towards her weight loss goal, but the importance of weighing daily and monitoring for symptoms of heart failure was reviewed. She continues to take all her cholesterol medication as well and follows up with her doctor for regular appointments and lab work.    Expected Outcomes Short: weight daily and monitor for signs of heart failure. Long: continue to control cardiac risk factors.          ITP Comments:  ITP Comments     Row Name 12/28/23 1620 01/04/24 1350 01/06/24 1045 01/27/24 1317 02/24/24 1111   ITP Comments Virtual Visit completed. Patient informed on EP and RD appointment and 6 Minute walk test. Patient also informed of patient health questionnaires on My Chart. Patient Verbalizes understanding. Visit diagnosis can be found in [facility] 11/20/2023. Completed and gym orientation for cardiac rehab. Initial ITP  created and sent for review to Dr. Oneil Pinal, Medical Director. First full day of exercise!  Patient was oriented to gym and equipment including functions, settings, policies, and procedures.  Patient's individual exercise prescription and treatment plan were reviewed.  All starting workloads were established based on the results of the 6 minute walk test done at initial orientation visit.  The plan for exercise progression was also introduced and progression will be customized based on patient's performance and goals. 30 Day review completed. Medical Director ITP review done, changes made as directed, and signed approval by Medical Director. New to program. 30 Day review completed. Medical Director ITP review done, changes made as directed, and signed approval by Medical Director.    Row Name 03/23/24 1120           ITP Comments 30 Day review completed. Medical Director ITP review done, changes made as directed, and signed approval by Medical Director.          Comments: 30 day review

## 2024-03-24 ENCOUNTER — Encounter

## 2024-03-30 ENCOUNTER — Encounter

## 2024-03-30 DIAGNOSIS — Z955 Presence of coronary angioplasty implant and graft: Secondary | ICD-10-CM

## 2024-03-30 NOTE — Progress Notes (Signed)
 Daily Session Note  Patient Details  Name: Sara Hodges MRN: [id number] Date of Birth: [birth year] Referring Provider:   Flowsheet Row Cardiac Rehab from 01/04/2024 in [facility] Cardiac and Pulmonary Rehab  Referring Provider Clarinda Sharper MD    Encounter Date: 03/30/2024  Check In:  Session Check In - 03/30/24 1547       Check-In   Supervising physician immediately available to respond to emergencies See telemetry face sheet for immediately available ER MD    Location [facility]-Cardiac & Pulmonary Rehab    Staff Present Hoy Rodney RN,BSN;[facility] Dyane BS, ACSM CEP, Exercise Physiologist;[facility]    Virtual Visit No    Medication changes reported     No    Warm-up and Cool-down Performed on first and last piece of equipment    Resistance Training Performed Yes    VAD Patient? No    PAD/SET Patient? No      Pain Assessment   Currently in Pain? No/denies             Social History   Tobacco Use  Smoking Status Never  Smokeless Tobacco Never    Goals Met:  Independence with exercise equipment Exercise tolerated well No report of concerns or symptoms today Strength training completed today  Goals Unmet:  Not Applicable  Comments: Pt able to follow exercise prescription today without complaint.  Will continue to monitor for progression.    Dr. Oneil Pinal is Medical Director for [facility] Cardiac Rehabilitation.  Dr. Fuad Aleskerov is Medical Director for [facility] Pulmonary Rehabilitation.

## 2024-03-31 ENCOUNTER — Encounter

## 2024-04-05 NOTE — Progress Notes (Signed)
 Subjective:   CC: Breast abscess of female [N61.1]  HPI:  referred by Lamar Norleen Manus, PA for evaluation of above.   History of Present Illness Sara Hodges is a 73 year old female who presents with a new breast abscess.  Her wire bra causes irritation to the area. She has experienced similar abscesses in the past and finds reassurance in previous mammogram results which were negative.    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), Cardiac pacemaker (12/01/2017), Cardiomyopathy, secondary (CMS/HHS-HCC), Carotid artery occlusion, CHF (congestive heart failure) (CMS/HHS-HCC), Coronary artery disease, Encounter for care of pacemaker (09/17/2018), Heart disease (5 yrs ago), History of atrial fibrillation (12/08/2017), HTN (hypertension), Hyperlipidemia, Migraines, Motion sickness, Myocardial infarction (CMS/HHS-HCC), Neuropathic pain of foot, NSVT (nonsustained ventricular tachycardia) (CMS/HHS-HCC) (09/28/2018), Pacemaker, Pancreatic cyst (HHS-HCC) (12/25/2014), Pleurisy, Preoperative evaluation to rule out surgical contraindication (12/29/2022), and Rheumatoid arthritis (CMS/HHS-HCC) (Hand and legs).  Past Surgical History:  has a past surgical history that includes Abdominal hysterectomy (1980s); hand surgery (Left); foot surgery (Left); Colonoscopy (03/19/2006, 08/25/2011); egd (08/25/2011); Cardiac Catheterization (06/2014); Colonoscopy; Upper gastrointestinal endoscopy (08/2019); Upper gastrointestinal endoscopy (N/A, 06/25/2016); Insert / replace / remove pacemaker (2018); esophagogastrodoudenoscopy w/ultrasound guided aspiration/biopsy (N/A, 09/13/2019); Hysterectomy; arthroscopic rotator cuff repair (Right, 02/04/2022); arthroscopy shoulder w/debridement (Right, 02/04/2022); tenodesis biceps w/transplantation long tendon open (Right, 02/04/2022); arthroscopic  subacromial decomp (Right, 02/04/2022); Upper gastrointestinal endoscopy (N/A, 12/03/2022); extraction cataract extracapsular w/insertion intraocular prosthesis (Right, 10/05/2023); and extraction cataract extracapsular w/insertion intraocular prosthesis (Left, 10/19/2023).  Family History: family history includes Allergies in her mother; Breast cancer in an other family member; Cancer in her mother and sister; Cardiomyopathy (Abnormal function of the heart muscle) in her mother; Colon cancer in an other family member; Colon polyps in an other family member; Heart disease in her mother; High blood pressure (Hypertension) in an other family member; Liver disease in her maternal grandfather; Lung cancer in her mother; Myocardial Infarction (Heart attack) in an other family member; No Known Problems in her daughter and daughter; Prostate cancer in her father; Rheum arthritis in her daughter; Uterine cancer in her sister.  Social History:  reports that she has never smoked. She has never used smokeless tobacco. She reports that she does not currently use alcohol. She reports that she does not use drugs.  Current Medications: has a current medication list which includes the following prescription(s): acetaminophen , clopidogrel, ergocalciferol  (vitamin d2), repatha sureclick, folic acid , furosemide , metoprolol succinate, pantoprazole, polyethylene glycol, entresto , aspirin , and nitroglycerin.  Allergies:  Allergies  Allergen Reactions  . Bactrim [Sulfamethoxazole-Trimethoprim] Hives and Abdominal Pain  . Beta-Blockers (Beta-Adrenergic Blocking Agts) Other (See Comments)    Second degree heart block   . Statins-Hmg-Coa Reductase Inhibitors Muscle Pain    Multiple statins including atorvastatin, rosuva, prava  . Amoxicillin-Pot Clavulanate Nausea and Other (See Comments)    Has patient had a PCN reaction causing immediate rash, facial/tongue/throat swelling, SOB or lightheadedness with hypotension: No Has  patient had a PCN reaction causing severe rash involving mucus membranes or skin necrosis: No Has patient had a PCN reaction that required hospitalization No Has patient had a PCN reaction occurring within the last 10 years: unknown If all of the above answers are NO, then may proceed with Cephalosporin use.  . Tramadol Nausea    ROS:  A 15 point review of systems was performed and pertinent positives and negatives noted in HPI   Objective:  Ht 167.6 cm (5' 6)   Wt 75.8 kg (167 lb)   BMI 26.95 kg/m   Constitutional :  No distress, cooperative, alert  Lymphatics/Throat:  Supple with no lymphadenopathy  Respiratory:  Clear to auscultation bilaterally  Cardiovascular:  Regular rate and rhythm  Gastrointestinal: Soft, non-tender, non-distended, no organomegaly.  Musculoskeletal: Steady gait and movement  Skin: Cool and moist  Psychiatric: Normal affect, non-agitated, not confused  Breast: Chaperone present for exam.  Left brast 6 oclock position with fluctuance and erythema, no skin breakage yet, consistent with abscess.      LABS:  N/a   RADS: N/a  Assessment:      Breast abscess of female [N61.1]  Plan:     1. Breast abscess of female [N61.1] recommended image guided drainage to eliminate surgical risks in otherwise high risk candidate secondary to comorbidities.  Will arrange.  Start abx in the meantime  Anticoag management per radiology for drainage.  labs/images/medications/previous chart entries reviewed personally and relevant changes/updates noted above.

## 2024-04-06 ENCOUNTER — Other Ambulatory Visit: Payer: Self-pay | Admitting: Surgery

## 2024-04-06 ENCOUNTER — Encounter

## 2024-04-06 DIAGNOSIS — N611 Abscess of the breast and nipple: Secondary | ICD-10-CM

## 2024-04-07 ENCOUNTER — Encounter

## 2024-04-08 ENCOUNTER — Ambulatory Visit
Admission: RE | Admit: 2024-04-08 | Discharge: 2024-04-08 | Disposition: A | Discharge status: Home / Self Care | Source: Ambulatory Visit | Attending: Surgery | Admitting: Surgery

## 2024-04-08 DIAGNOSIS — N611 Abscess of the breast and nipple: Secondary | ICD-10-CM

## 2024-04-08 MED ORDER — LIDOCAINE HCL 1 % IJ SOLN
3.0000 mL | Freq: Once | INTRAMUSCULAR | Status: AC
  Administered 2024-04-08: 3 mL
  Filled 2024-04-08: qty 3

## 2024-04-12 ENCOUNTER — Other Ambulatory Visit: Payer: Self-pay | Admitting: Surgery

## 2024-04-12 DIAGNOSIS — N611 Abscess of the breast and nipple: Secondary | ICD-10-CM

## 2024-04-12 LAB — BODY FLUID CULTURE W GRAM STAIN: Culture: NO GROWTH

## 2024-04-13 ENCOUNTER — Encounter

## 2024-04-14 ENCOUNTER — Encounter

## 2024-04-15 ENCOUNTER — Ambulatory Visit
Admission: RE | Admit: 2024-04-15 | Discharge: 2024-04-15 | Disposition: A | Discharge status: Home / Self Care | Source: Ambulatory Visit | Attending: Surgery | Admitting: Surgery

## 2024-04-15 DIAGNOSIS — N611 Abscess of the breast and nipple: Secondary | ICD-10-CM | POA: Insufficient documentation

## 2024-04-20 ENCOUNTER — Encounter

## 2024-04-20 DIAGNOSIS — Z955 Presence of coronary angioplasty implant and graft: Secondary | ICD-10-CM

## 2024-04-20 NOTE — Progress Notes (Signed)
 Cardiac Individual Treatment Plan  Patient Details  Name: Sara Hodges MRN: [id number] Date of Birth: [birth year] Referring Provider:   Flowsheet Row Cardiac Rehab from 01/04/2024 in [facility] Cardiac and Pulmonary Rehab  Referring Provider Clarinda Sharper MD    Initial Encounter Date:  Flowsheet Row Cardiac Rehab from 01/04/2024 in [facility] Cardiac and Pulmonary Rehab  Date 01/04/24    Visit Diagnosis: Status post coronary artery stent placement  Patient's Home Medications on Admission:  Current Outpatient Medications:    acetaminophen  (TYLENOL ) 500 MG tablet, Take by mouth., Disp: , Rfl:    aspirin  EC 81 MG tablet, Take 81 mg by mouth daily. , Disp: , Rfl:    celecoxib (CELEBREX) 200 MG capsule, , Disp: , Rfl:    clopidogrel (PLAVIX) 75 MG tablet, Take 75 mg by mouth daily., Disp: , Rfl:    ENTRESTO  49-51 MG, Take 1 tablet by mouth 2 (two) times daily. (Patient not taking: Reported on 12/28/2023), Disp: , Rfl:    ENTRESTO  97-103 MG, Take 1 tablet by mouth 2 (two) times daily., Disp: , Rfl:    folic acid  (FOLVITE ) 1 MG tablet, Take 1 mg by mouth daily. (Patient not taking: Reported on 12/28/2023), Disp: , Rfl:    folic acid  (FOLVITE ) 1 MG tablet, Take by mouth., Disp: , Rfl:    furosemide  (LASIX ) 20 MG tablet, Take 20 mg by mouth daily as needed for fluid., Disp: , Rfl:    levocetirizine (XYZAL) 5 MG tablet, Take 1 tablet by mouth every evening., Disp: , Rfl:    metoprolol succinate (TOPROL-XL) 25 MG 24 hr tablet, , Disp: , Rfl:    metoprolol succinate (TOPROL-XL) 25 MG 24 hr tablet, Take by mouth., Disp: , Rfl:    nitroGLYCERIN (NITROSTAT) 0.4 MG SL tablet, Place under the tongue., Disp: , Rfl:    pantoprazole (PROTONIX) 40 MG tablet, Take 40 mg by mouth daily., Disp: , Rfl:    polyethylene glycol powder (GLYCOLAX/MIRALAX) 17 GM/SCOOP powder, Take by mouth. (Patient not taking: Reported on 12/28/2023), Disp: , Rfl:    predniSONE (DELTASONE) 10 MG tablet, 2 tabs daily for 4 days, 1 tab daily for  4 days, Disp: , Rfl:    spironolactone  (ALDACTONE ) 25 MG tablet, Take 25 mg by mouth daily. (Patient not taking: Reported on 12/28/2023), Disp: , Rfl:    triamcinolone ointment (KENALOG) 0.1 %, Apply twice daily to raised itchy areas until smooth (typically 5-10 days) (Patient not taking: Reported on 12/28/2023), Disp: , Rfl:    Vitamin D , Ergocalciferol , (DRISDOL ) 50000 UNITS CAPS capsule, Take 50,000 Units by mouth every 7 (seven) days. Takes on Sunday, Disp: , Rfl:   Past Medical History: Past Medical History:  Diagnosis Date   CAD (coronary artery disease)    CHF (congestive heart failure) (HCC)    HLD (hyperlipidemia)    Hypertension    Pancreatic cyst    Pancreatic mass     Tobacco Use: Social History   Tobacco Use  Smoking Status Never  Smokeless Tobacco Never    Labs: Review Flowsheet        No data to display           Exercise Target Goals: Exercise Program Goal: Individual exercise prescription set using results from initial 6 min walk test and THRR while considering  patient's activity barriers and safety.   Exercise Prescription Goal: Initial exercise prescription builds to 30-45 minutes a day of aerobic activity, 2-3 days per week.  Home exercise guidelines will be given  to patient during program as part of exercise prescription that the participant will acknowledge.   Education: Aerobic Exercise: - Group verbal and visual presentation on the components of exercise prescription. Introduces F.I.T.T principle from ACSM for exercise prescriptions.  Reviews F.I.T.T. principles of aerobic exercise including progression. Written material given at graduation.   Education: Resistance Exercise: - Group verbal and visual presentation on the components of exercise prescription. Introduces F.I.T.T principle from ACSM for exercise prescriptions  Reviews F.I.T.T. principles of resistance exercise including progression. Written material given at graduation.    Education:  Exercise & Equipment Safety: - Individual verbal instruction and demonstration of equipment use and safety with use of the equipment. Flowsheet Row Cardiac Rehab from 03/23/2024 in [facility] Cardiac and Pulmonary Rehab  Date 01/04/24  Educator MB  Instruction Review Code 1- Verbalizes Understanding    Education: Exercise Physiology & General Exercise Guidelines: - Group verbal and written instruction with models to review the exercise physiology of the cardiovascular system and associated critical values. Provides general exercise guidelines with specific guidelines to those with heart or lung disease.    Education: Flexibility, Balance, Mind/Body Relaxation: - Group verbal and visual presentation with interactive activity on the components of exercise prescription. Introduces F.I.T.T principle from ACSM for exercise prescriptions. Reviews F.I.T.T. principles of flexibility and balance exercise training including progression. Also discusses the mind body connection.  Reviews various relaxation techniques to help reduce and manage stress (i.e. Deep breathing, progressive muscle relaxation, and visualization). Balance handout provided to take home. Written material given at graduation.   Activity Barriers & Risk Stratification:  Activity Barriers & Cardiac Risk Stratification - 01/04/24 1352       Activity Barriers & Cardiac Risk Stratification   Activity Barriers Back Problems;Other (comment)    Comments L leg aches (mainly at night), occasional low and mid back pain due to degenerative disc disease    Cardiac Risk Stratification Moderate          6 Minute Walk:  6 Minute Walk     Row Name 01/04/24 1350         6 Minute Walk   Phase Initial     Distance 1390 feet     Walk Time 6 minutes     # of Rest Breaks 0     MPH 2.63     METS 2.83     RPE 9     Perceived Dyspnea  0     VO2 Peak 9.89     Symptoms No     Resting HR 69 bpm     Resting BP 108/70     Resting Oxygen Saturation   99 %     Exercise Oxygen Saturation  during 6 min walk 100 %     Max Ex. HR 90 bpm     Max Ex. BP 146/68     2 Minute Post BP 118/66        Oxygen Initial Assessment:   Oxygen Re-Evaluation:   Oxygen Discharge (Final Oxygen Re-Evaluation):   Initial Exercise Prescription:  Initial Exercise Prescription - 01/04/24 1300       Date of Initial Exercise RX and Referring Provider   Date 01/04/24    Referring Provider Clarinda Sharper MD      Oxygen   Maintain Oxygen Saturation 88% or higher      Recumbant Bike   Level 2    RPM 50    Watts 20    Minutes 15    METs  2.83      NuStep   Level 2    SPM 80    Minutes 15    METs 2.83      REL-XR   Level 1    Speed 50    Minutes 15    METs 2.83      T5 Nustep   Level 2    SPM 80    Minutes 15    METs 2.83      Track   Laps 33    Minutes 15    METs 2.79      Prescription Details   Frequency (times per week) 2    Duration Progress to 30 minutes of continuous aerobic without signs/symptoms of physical distress      Intensity   THRR 40-80% of Max Heartrate 100-131    Ratings of Perceived Exertion 11-13    Perceived Dyspnea 0-4      Progression   Progression Continue to progress workloads to maintain intensity without signs/symptoms of physical distress.      Resistance Training   Training Prescription Yes    Weight 4 lb    Reps 10-15          Perform Capillary Blood Glucose checks as needed.  Exercise Prescription Changes:   Exercise Prescription Changes     Row Name 01/04/24 1300 01/14/24 1800 01/26/24 1500 02/11/24 1800 02/25/24 1600     Response to Exercise   Blood Pressure (Admit) 108/70 102/58 102/60 134/62 122/60   Blood Pressure (Exercise) 146/68 130/60 148/62 144/58 122/58   Blood Pressure (Exit) 118/66 108/60 110/62 118/70 110/62   Heart Rate (Admit) 69 bpm 82 bpm 77 bpm 72 bpm 79 bpm   Heart Rate (Exercise) 90 bpm 90 bpm 116 bpm 116 bpm 115 bpm   Heart Rate (Exit) 69 bpm 74 bpm 93  bpm 85 bpm 89 bpm   Oxygen Saturation (Admit) 99 % -- -- -- --   Oxygen Saturation (Exercise) 100 % -- -- -- --   Oxygen Saturation (Exit) 100 % -- -- -- --   Rating of Perceived Exertion (Exercise) 9 11 13 13 14    Perceived Dyspnea (Exercise) 0 -- -- -- --   Symptoms none none none none none   Comments results 1st full day of exercise -- -- --   Duration Progress to 30 minutes of  aerobic without signs/symptoms of physical distress Continue with 30 min of aerobic exercise without signs/symptoms of physical distress. Continue with 30 min of aerobic exercise without signs/symptoms of physical distress. Continue with 30 min of aerobic exercise without signs/symptoms of physical distress. Continue with 30 min of aerobic exercise without signs/symptoms of physical distress.   Intensity THRR New THRR unchanged THRR unchanged THRR unchanged THRR unchanged     Progression   Progression Continue to progress workloads to maintain intensity without signs/symptoms of physical distress. Continue to progress workloads to maintain intensity without signs/symptoms of physical distress. Continue to progress workloads to maintain intensity without signs/symptoms of physical distress. Continue to progress workloads to maintain intensity without signs/symptoms of physical distress. Continue to progress workloads to maintain intensity without signs/symptoms of physical distress.   Average METs 2.83 3.38 3.07 3.17 3.38     Resistance Training   Training Prescription -- Yes Yes Yes Yes   Weight -- 4 lb 5 lb 5 lb 5 lb   Reps -- 10-15 10-15 10-15 10-15     Interval Training   Interval Training -- No No  No No     Treadmill   MPH -- -- 2.5 2.6 --   Grade -- -- 0 0.5 --   Minutes -- -- 15 15 --   METs -- -- 2.91 3.17 --     Recumbant Bike   Level -- -- 5 5 8    Watts -- -- 39 20 57   Minutes -- -- 15 15 15    METs -- -- 2.81 2.8 4.28     NuStep   Level -- 2 4 4 4    Minutes -- 15 15 15 15    METs -- 5 4  4.4 3.9     REL-XR   Level -- -- 6 -- --   Minutes -- -- 15 -- --   METs -- -- 4.5 -- --     T5 Nustep   Level -- -- 3 3 2    Minutes -- -- 15 15 15    METs -- -- 2.2 2.3 3     Track   Laps -- 14 14 -- --   Minutes -- 15 15 -- --   METs -- 1.76 1.76 -- --     Oxygen   Maintain Oxygen Saturation -- 88% or higher 88% or higher 88% or higher 88% or higher    Row Name 03/09/24 0900 03/10/24 0900 03/24/24 1500 04/06/24 0900       Response to Exercise   Blood Pressure (Admit) -- 116/54 108/62 110/58    Blood Pressure (Exit) -- 106/58 96/62 112/70    Heart Rate (Admit) -- 74 bpm 68 bpm 74 bpm    Heart Rate (Exercise) -- 115 bpm 117 bpm 117 bpm    Heart Rate (Exit) 89 bpm 88 bpm 94 bpm 77 bpm    Rating of Perceived Exertion (Exercise) -- 13 15 12     Symptoms -- none none none    Duration Continue with 30 min of aerobic exercise without signs/symptoms of physical distress. Continue with 30 min of aerobic exercise without signs/symptoms of physical distress. Continue with 30 min of aerobic exercise without signs/symptoms of physical distress. Continue with 30 min of aerobic exercise without signs/symptoms of physical distress.    Intensity THRR unchanged THRR unchanged THRR unchanged THRR unchanged      Progression   Progression Continue to progress workloads to maintain intensity without signs/symptoms of physical distress. Continue to progress workloads to maintain intensity without signs/symptoms of physical distress. Continue to progress workloads to maintain intensity without signs/symptoms of physical distress. Continue to progress workloads to maintain intensity without signs/symptoms of physical distress.    Average METs 3.38 3.61 3.42 4.73      Resistance Training   Training Prescription Yes Yes Yes Yes    Weight 5 lb 5 lb 5 lb 5 lb    Reps 10-15 10-15 10-15 10-15      Interval Training   Interval Training No No No No      Treadmill   MPH -- -- 2.6 2.6    Grade -- -- 2.5  2.6    Minutes -- -- 15 15    METs -- -- 3.89 3.89      Recumbant Bike   Level 8 8 8  --    Watts 57 54 51 --    Minutes 15 15 15  --    METs 4.28 4.32 4.28 --      NuStep   Level 4 4 3  --    Minutes 15 15 13  --    METs  3.9 4.6 2 --      REL-XR   Level -- -- -- 9    Minutes -- -- -- 15    METs -- -- -- 6.9      T5 Nustep   Level 2 4 4  --    Minutes 15 15 15  --    METs 3 2.2 2.5 --      Biostep-RELP   Level -- -- 2 --    Minutes -- -- 15 --    METs -- -- 2 --      Rower   Level -- -- -- 10    Minutes -- -- -- 15    METs -- -- -- 30      Home Exercise Plan   Plans to continue exercise at Home (comment)  walk Home (comment)  walk Home (comment)  walk Home (comment)  walk    Frequency Add 2 additional days to program exercise sessions. Add 2 additional days to program exercise sessions. Add 2 additional days to program exercise sessions. Add 2 additional days to program exercise sessions.    Initial Home Exercises Provided 03/09/24 03/09/24 03/09/24 03/09/24      Oxygen   Maintain Oxygen Saturation 88% or higher 88% or higher 88% or higher 88% or higher       Exercise Comments:   Exercise Comments     Row Name 01/06/24 1045           Exercise Comments First full day of exercise!  Patient was oriented to gym and equipment including functions, settings, policies, and procedures.  Patient's individual exercise prescription and treatment plan were reviewed.  All starting workloads were established based on the results of the 6 minute walk test done at initial orientation visit.  The plan for exercise progression was also introduced and progression will be customized based on patient's performance and goals.          Exercise Goals and Review:   Exercise Goals     Row Name 01/04/24 1356             Exercise Goals   Increase Physical Activity Yes       Intervention Provide advice, education, support and counseling about physical activity/exercise  needs.;Develop an individualized exercise prescription for aerobic and resistive training based on initial evaluation findings, risk stratification, comorbidities and participant's personal goals.       Expected Outcomes Short Term: Attend rehab on a regular basis to increase amount of physical activity.;Long Term: Add in home exercise to make exercise part of routine and to increase amount of physical activity.;Long Term: Exercising regularly at least 3-5 days a week.       Increase Strength and Stamina Yes       Intervention Provide advice, education, support and counseling about physical activity/exercise needs.;Develop an individualized exercise prescription for aerobic and resistive training based on initial evaluation findings, risk stratification, comorbidities and participant's personal goals.       Expected Outcomes Short Term: Increase workloads from initial exercise prescription for resistance, speed, and METs.;Short Term: Perform resistance training exercises routinely during rehab and add in resistance training at home;Long Term: Improve cardiorespiratory fitness, muscular endurance and strength as measured by increased METs and functional capacity ( )       Able to understand and use rate of perceived exertion (RPE) scale Yes       Intervention Provide education and explanation on how to use RPE scale  Expected Outcomes Short Term: Able to use RPE daily in rehab to express subjective intensity level;Long Term:  Able to use RPE to guide intensity level when exercising independently       Able to understand and use Dyspnea scale Yes       Intervention Provide education and explanation on how to use Dyspnea scale       Expected Outcomes Short Term: Able to use Dyspnea scale daily in rehab to express subjective sense of shortness of breath during exertion;Long Term: Able to use Dyspnea scale to guide intensity level when exercising independently       Knowledge and understanding of  Target Heart Rate Range (THRR) Yes       Intervention Provide education and explanation of THRR including how the numbers were predicted and where they are located for reference       Expected Outcomes Short Term: Able to state/look up THRR;Short Term: Able to use daily as guideline for intensity in rehab;Long Term: Able to use THRR to govern intensity when exercising independently       Able to check pulse independently Yes       Intervention Provide education and demonstration on how to check pulse in carotid and radial arteries.;Review the importance of being able to check your own pulse for safety during independent exercise       Expected Outcomes Short Term: Able to explain why pulse checking is important during independent exercise;Long Term: Able to check pulse independently and accurately       Understanding of Exercise Prescription Yes       Intervention Provide education, explanation, and written materials on patient's individual exercise prescription       Expected Outcomes Short Term: Able to explain program exercise prescription;Long Term: Able to explain home exercise prescription to exercise independently          Exercise Goals Re-Evaluation :  Exercise Goals Re-Evaluation     Row Name 01/06/24 1045 01/14/24 1805 01/26/24 1527 02/11/24 1813 02/25/24 1629     Exercise Goal Re-Evaluation   Exercise Goals Review Able to understand and use rate of perceived exertion (RPE) scale;Knowledge and understanding of Target Heart Rate Range (THRR);Understanding of Exercise Prescription;Able to understand and use Dyspnea scale Increase Physical Activity;Understanding of Exercise Prescription;Increase Strength and Stamina Increase Physical Activity;Understanding of Exercise Prescription;Increase Strength and Stamina Increase Physical Activity;Understanding of Exercise Prescription;Increase Strength and Stamina Increase Physical Activity;Understanding of Exercise Prescription;Increase Strength and  Stamina   Comments Reviewed RPE and dyspnea scale, THR and program prescription with pt today.  Pt voiced understanding and was given a copy of goals to take home. Khristina completed her first exercise session in this review. She was able to do level 2 on the T4 nustep and 14 laps on the track. We will continue to monitor her progress in the program. Marquesa is doing well in the rehab. She improved to level 3 on the T5 nustep, level 4 on the T4 nustep, level 5 on the recumbent bike, and level 6 on the XR. She also began using the treadmill at a speed of 2.5 mph with no incline. We will continue to monitor her progress in the program. Juvia is doing well in the rehab. She increased her workload on the treadmill to a speed of 2.6 mph and incline of 0.5%. She maintained level 4 on the T4 nustep, level 3 on the T5 nustep, and level 5 on the recumbent bike. We will continue to monitor her progress in  the program. Krystine continues to do well in rehab. She recently improved to level 8 on the recumbent bike. She has not done any walking on the treadmill since the last review. We will continue to monitor her progress in the program.   Expected Outcomes Short: Use RPE daily to regulate intensity. Long: Follow program prescription in THR. Short: Continue to follow current exercise prescription. Long: Continue exercise to improve strength and stamina. Short: Continue to progressively increase treadmill workload. Long: Continue exercise to improve strength and stamina. Short: Try level 4 on the T5 nustep. Long: Continue exercise to improve strength and stamina. Short: Return to walking on the treadmill in the program. Long: Continue exercise to improve strength and stamina.    Row Name 03/09/24 9045 03/10/24 0924 03/24/24 1535 03/24/24 1536 03/24/24 1539     Exercise Goal Re-Evaluation   Exercise Goals Review Increase Physical Activity;Able to understand and use rate of perceived exertion (RPE) scale;Knowledge and understanding  of Target Heart Rate Range (THRR);Understanding of Exercise Prescription;Increase Strength and Stamina;Able to understand and use Dyspnea scale;Able to check pulse independently Increase Physical Activity;Increase Strength and Stamina;Understanding of Exercise Prescription Increase Physical Activity;Increase Strength and Stamina;Understanding of Exercise Prescription -- --   Comments Reviewed home exercise with pt today.  Pt plans to walk for exercise.  Reviewed THR, pulse, RPE, sign and symptoms, pulse oximetery and when to call 911 or MD.  Also discussed weather considerations and indoor options.  Pt voiced understanding. Coley is doing well in rehab. She recently improved to level 4 on the T5 nustep, and continued to work at level 8 on the recumbent bike. She has not done any walking on the treadmill since the last review. We will continue to monitor her progress in the program. Karalee continues to do well with her exercise progression.  She Cassie continues to do well with her exercise progression.  She increased the RB from level 1 to level 8. T5 from level 1 to level 4, BS from level 1 to level 2 and T4 from levle 2 to level 3. Maintained Bike at level 1. Doniesha continues to do well with her exercise progression.  She increased the RB from level 1 to level 8. T5 from level 1 to level 4, BS from level 1 to level 2 and T4 from levle 2 to level 3. Maintained Bike at level 1.  WIll continue to monitor her progress   Expected Outcomes Short: add 1-2 days a week of home exercise on off days of cardiac rehab. Long: maintain independent exercise routine upon graduation from cardiac rehab. Short: Return to walking the treadmill in rehab. Long: Continue exercise to improve strength and stamina. -- -- STG continue with good attendance, exercise progression as tolerated  LTG exercise progression continues after discharge    Row Name 04/06/24 0928             Exercise Goal Re-Evaluation   Exercise Goals Review  Increase Physical Activity;Increase Strength and Stamina;Understanding of Exercise Prescription       Comments Kanaya is doing well in rehab. She was able to use the XR at level 9 and was also able to maintain a treadmill workload of 2.6mph and 2.5% incline. She was also able to add the rowing machine at level 10. We will continue to monitor her progress in the program.       Expected Outcomes Short: Improve on . Long: Continue exercise to improve strength and stamina.  Discharge Exercise Prescription (Final Exercise Prescription Changes):  Exercise Prescription Changes - 04/06/24 0900       Response to Exercise   Blood Pressure (Admit) 110/58    Blood Pressure (Exit) 112/70    Heart Rate (Admit) 74 bpm    Heart Rate (Exercise) 117 bpm    Heart Rate (Exit) 77 bpm    Rating of Perceived Exertion (Exercise) 12    Symptoms none    Duration Continue with 30 min of aerobic exercise without signs/symptoms of physical distress.    Intensity THRR unchanged      Progression   Progression Continue to progress workloads to maintain intensity without signs/symptoms of physical distress.    Average METs 4.73      Resistance Training   Training Prescription Yes    Weight 5 lb    Reps 10-15      Interval Training   Interval Training No      Treadmill   MPH 2.6    Grade 2.6    Minutes 15    METs 3.89      REL-XR   Level 9    Minutes 15    METs 6.9      Rower   Level 10    Minutes 15    METs 30      Home Exercise Plan   Plans to continue exercise at Home (comment)   walk   Frequency Add 2 additional days to program exercise sessions.    Initial Home Exercises Provided 03/09/24      Oxygen   Maintain Oxygen Saturation 88% or higher          Nutrition:  Target Goals: Understanding of nutrition guidelines, daily intake of sodium 1500mg , cholesterol 200mg , calories 30% from fat and 7% or less from saturated fats, daily to have 5 or more servings of fruits and  vegetables.  Education: All About Nutrition: -Group instruction provided by verbal, written material, interactive activities, discussions, models, and posters to present general guidelines for heart healthy nutrition including fat, fiber, MyPlate, the role of sodium in heart healthy nutrition, utilization of the nutrition label, and utilization of this knowledge for meal planning. Follow up email sent as well. Written material given at graduation. Flowsheet Row Cardiac Rehab from 03/23/2024 in [facility] Cardiac and Pulmonary Rehab  Date 03/02/24  Educator [email or web address]  Instruction Review Code 1- Verbalizes Understanding    Biometrics:  Pre Biometrics - 01/04/24 1357       Pre Biometrics   Height 5' 5.39 (1.661 m)    Weight 171 lb 6.4 oz (77.7 kg)    Waist Circumference 33 inches    Hip Circumference 43.5 inches    Waist to Hip Ratio 0.76 %    BMI (Calculated) 28.18    Single Leg Stand 30 seconds           Nutrition Therapy Plan and Nutrition Goals:  Nutrition Therapy & Goals - 01/06/24 1115       Nutrition Therapy   Diet Cardiac, Low Na    Protein (specify units) 80g    Fiber 25 grams    Whole Grain Foods 3 servings    Saturated Fats 15 max. grams    Fruits and Vegetables 5 servings/day    Sodium 2 grams      Personal Nutrition Goals   Nutrition Goal Read labels and reduce sodium intake to below 2300mg . Ideally 1500mg  per day.    Personal Goal #2 Eat 15-30gProtein and 30-60gCarbs at  each meal.    Personal Goal #3 Reduce saturated fat, less than 12g per day. Replace bad fats for more heart healthy fats.    Comments Patient drinking mostly water but sometimes has a sweet beverage. She reports she has been working on cutting back on the sugary beverages. She reports eating healthy choices mostly, says sometimes she snacks at night on popcorn with butter or cookies. Sometimes she might cook eggs and bacon for breakfast on weekends but not every week. Encouraged her to enjoy comfort  foods from time to time, but should not be regular or routine. Reviewed mediterranean diet handout. Educated on types of fats, sources, how to read labels. Provided guidelines on sodium and saturated fat, recommending she stay below 1500mg  sodium and 15gSat fat. Brainstormed some meal and snack ideas with foods she likes and will eat focusing on balanced plates with adequate protein and controlled sodium.      Intervention Plan   Intervention Prescribe, educate and counsel regarding individualized specific dietary modifications aiming towards targeted core components such as weight, hypertension, lipid management, diabetes, heart failure and other comorbidities.;Nutrition handout(s) given to patient.    Expected Outcomes Short Term Goal: Understand basic principles of dietary content, such as calories, fat, sodium, cholesterol and nutrients.;Short Term Goal: A plan has been developed with personal nutrition goals set during dietitian appointment.;Long Term Goal: Adherence to prescribed nutrition plan.          Nutrition Assessments:  MEDIFICTS Score Key: >=70 Need to make dietary changes  40-70 Heart Healthy Diet <= 40 Therapeutic Level Cholesterol Diet  Flowsheet Row Cardiac Rehab from 01/13/2024 in [facility] Cardiac and Pulmonary Rehab  Picture Your Plate Total Score on Admission 61   Picture Your Plate Scores: <59 Unhealthy dietary pattern with much room for improvement. 41-50 Dietary pattern unlikely to meet recommendations for good health and room for improvement. 51-60 More healthful dietary pattern, with some room for improvement.  >60 Healthy dietary pattern, although there may be some specific behaviors that could be improved.    Nutrition Goals Re-Evaluation:  Nutrition Goals Re-Evaluation     Row Name 03/09/24 0956             Goals   Comment Maryelizabeth reports that she is reading food labels and being mindful of what she is eating. She mostly is trying to eat lean protein and  fresh vegetables to make sure she has balanced meals. She is also making an effort to drink more water.       Expected Outcome Short: continue to get comfortable with reading food labels. Long: maintain heart healthy diet long term to help control cardiac risk factors.          Nutrition Goals Discharge (Final Nutrition Goals Re-Evaluation):  Nutrition Goals Re-Evaluation - 03/09/24 0956       Goals   Comment Kiandria reports that she is reading food labels and being mindful of what she is eating. She mostly is trying to eat lean protein and fresh vegetables to make sure she has balanced meals. She is also making an effort to drink more water.    Expected Outcome Short: continue to get comfortable with reading food labels. Long: maintain heart healthy diet long term to help control cardiac risk factors.          Psychosocial: Target Goals: Acknowledge presence or absence of significant depression and/or stress, maximize coping skills, provide positive support system. Participant is able to verbalize types and ability to use techniques  and skills needed for reducing stress and depression.   Education: Stress, Anxiety, and Depression - Group verbal and visual presentation to define topics covered.  Reviews how body is impacted by stress, anxiety, and depression.  Also discusses healthy ways to reduce stress and to treat/manage anxiety and depression.  Written material given at graduation. Flowsheet Row Cardiac Rehab from 03/23/2024 in [facility] Cardiac and Pulmonary Rehab  Date 01/27/24  Educator [facility]  Instruction Review Code 1- [employer] Understanding    Education: Sleep Hygiene -Provides group verbal and written instruction about how sleep can affect your health.  Define sleep hygiene, discuss sleep cycles and impact of sleep habits. Review good sleep hygiene tips.    Initial Review & Psychosocial Screening:  Initial Psych Review & Screening - 12/28/23 1617       Initial Review   Current  issues with None Identified      Family Dynamics   Good Support System? Yes    Comments Chellsea can look to her friends, significant other and daughter for support. She states no depression or anxiety and does not take any medications for her mood.      Barriers   Psychosocial barriers to participate in program There are no identifiable barriers or psychosocial needs.;The patient should benefit from training in stress management and relaxation.      Screening Interventions   Interventions Encouraged to exercise;Provide feedback about the scores to participant;To provide support and resources with identified psychosocial needs    Expected Outcomes Short Term goal: Utilizing psychosocial counselor, staff and physician to assist with identification of specific Stressors or current issues interfering with healing process. Setting desired goal for each stressor or current issue identified.;Long Term Goal: Stressors or current issues are controlled or eliminated.;Short Term goal: Identification and review with participant of any Quality of Life or Depression concerns found by scoring the questionnaire.;Long Term goal: The participant improves quality of Life and PHQ9 Scores as seen by post scores and/or verbalization of changes          Quality of Life Scores:   Quality of Life - 01/13/24 1045       Quality of Life   Select Quality of Life      Quality of Life Scores   Health/Function Pre 25.86 %    Socioeconomic Pre 22.56 %    Psych/Spiritual Pre 25.75 %    Family Pre 24.9 %    GLOBAL Pre 24.89 %         Scores of 19 and below usually indicate a poorer quality of life in these areas.  A difference of  2-3 points is a clinically meaningful difference.  A difference of 2-3 points in the total score of the Quality of Life Index has been associated with significant improvement in overall quality of life, self-image, physical symptoms, and general health in studies assessing change in quality  of life.  PHQ-9: Review Flowsheet       01/04/2024 04/22/2016  Depression screen PHQ 2/9  Decreased Interest 0 0  Down, Depressed, Hopeless 0 0  PHQ - 2 Score 0 0  Altered sleeping 1 0  Tired, decreased energy 0 1  Change in appetite 0 1  Feeling bad or failure about yourself  0 0  Trouble concentrating 0 0  Moving slowly or fidgety/restless 0 0  Suicidal thoughts 0 0   PHQ-9 Score 1 2  Difficult doing work/chores Not difficult at all Not difficult at all    Details  Data saved with a previous flowsheet row definition        Interpretation of Total Score  Total Score Depression Severity:  1-4 = Minimal depression, 5-9 = Mild depression, 10-14 = Moderate depression, 15-19 = Moderately severe depression, 20-27 = Severe depression   Psychosocial Evaluation and Intervention:  Psychosocial Evaluation - 12/28/23 1618       Psychosocial Evaluation & Interventions   Interventions Encouraged to exercise with the program and follow exercise prescription;Relaxation education;Stress management education    Comments Ronnika can look to her friends, significant other and daughter for support. She states no depression or anxiety and does not take any medications for her mood.    Expected Outcomes Short: Start [facility] to help with mood. Long: Maintain a healthy mental state    Continue Psychosocial Services  Follow up required by staff          Psychosocial Re-Evaluation:  Psychosocial Re-Evaluation     Row Name 03/09/24 1000             Psychosocial Re-Evaluation   Current issues with None Identified       Comments Pacey reports that she has no concerns with stress or mental health effecting her ability to function in life. She does report that she does not sleep well all the time. She has a good support system and healthy ways of managing stress.       Expected Outcomes Short: continue to attend cardiac rehab for mental health benefits of exercise. Long: maintain good  mental health routine.       Interventions Encouraged to attend Cardiac Rehabilitation for the exercise       Continue Psychosocial Services  Follow up required by staff          Psychosocial Discharge (Final Psychosocial Re-Evaluation):  Psychosocial Re-Evaluation - 03/09/24 1000       Psychosocial Re-Evaluation   Current issues with None Identified    Comments Bralee reports that she has no concerns with stress or mental health effecting her ability to function in life. She does report that she does not sleep well all the time. She has a good support system and healthy ways of managing stress.    Expected Outcomes Short: continue to attend cardiac rehab for mental health benefits of exercise. Long: maintain good mental health routine.    Interventions Encouraged to attend Cardiac Rehabilitation for the exercise    Continue Psychosocial Services  Follow up required by staff          Vocational Rehabilitation: Provide vocational rehab assistance to qualifying candidates.   Vocational Rehab Evaluation & Intervention:   Education: Education Goals: Education classes will be provided on a variety of topics geared toward better understanding of heart health and risk factor modification. Participant will state understanding/return demonstration of topics presented as noted by education test scores.  Learning Barriers/Preferences:  Learning Barriers/Preferences - 12/28/23 1614       Learning Barriers/Preferences   Learning Barriers None    Learning Preferences Group Instruction;Individual Instruction;Pictoral;Skilled Demonstration;Verbal Instruction;Video;Written Material          General Cardiac Education Topics:  AED/CPR: - Group verbal and written instruction with the use of models to demonstrate the basic use of the AED with the basic ABC's of resuscitation.   Anatomy and Cardiac Procedures: - Group verbal and visual presentation and models provide information about basic  cardiac anatomy and function. Reviews the testing methods done to diagnose heart disease and the outcomes of the test  results. Describes the treatment choices: Medical Management, Angioplasty, or Coronary Bypass Surgery for treating various heart conditions including Myocardial Infarction, Angina, Valve Disease, and Cardiac Arrhythmias.  Written material given at graduation. Flowsheet Row Cardiac Rehab from 03/23/2024 in [facility] Cardiac and Pulmonary Rehab  Date 03/09/24  Educator SB  Instruction Review Code 1- Verbalizes Understanding    Medication Safety: - Group verbal and visual instruction to review commonly prescribed medications for heart and lung disease. Reviews the medication, class of the drug, and side effects. Includes the steps to properly store meds and maintain the prescription regimen.  Written material given at graduation.   Intimacy: - Group verbal instruction through game format to discuss how heart and lung disease can affect sexual intimacy. Written material given at graduation..   Know Your Numbers and Heart Failure: - Group verbal and visual instruction to discuss disease risk factors for cardiac and pulmonary disease and treatment options.  Reviews associated critical values for Overweight/Obesity, Hypertension, Cholesterol, and Diabetes.  Discusses basics of heart failure: signs/symptoms and treatments.  Introduces Heart Failure Zone chart for action plan for heart failure.  Written material given at graduation. Flowsheet Row Cardiac Rehab from 03/23/2024 in [facility] Cardiac and Pulmonary Rehab  Date 01/13/24  Educator [facility]  Instruction Review Code 1- Verbalizes Understanding    Infection Prevention: - Provides verbal and written material to individual with discussion of infection control including proper hand washing and proper equipment cleaning during exercise session. Flowsheet Row Cardiac Rehab from 03/23/2024 in [facility] Cardiac and Pulmonary Rehab  Date 01/04/24  Educator MB   Instruction Review Code 1- Verbalizes Understanding    Falls Prevention: - Provides verbal and written material to individual with discussion of falls prevention and safety. Flowsheet Row Cardiac Rehab from 03/23/2024 in [facility] Cardiac and Pulmonary Rehab  Date 01/04/24  Educator MB  Instruction Review Code 1- Verbalizes Understanding    Other: -Provides group and verbal instruction on various topics (see comments)   Knowledge Questionnaire Score:  Knowledge Questionnaire Score - 01/13/24 1045       Knowledge Questionnaire Score   Pre Score 25/26          Core Components/Risk Factors/Patient Goals at Admission:  Personal Goals and Risk Factors at Admission - 01/04/24 1359       Core Components/Risk Factors/Patient Goals on Admission    Weight Management Yes;Weight Loss    Intervention Weight Management: Develop a combined nutrition and exercise program designed to reach desired caloric intake, while maintaining appropriate intake of nutrient and fiber, sodium and fats, and appropriate energy expenditure required for the weight goal.;Weight Management: Provide education and appropriate resources to help participant work on and attain dietary goals.;Weight Management/Obesity: Establish reasonable short term and long term weight goals.    Admit Weight 171 lb 6.4 oz (77.7 kg)    Goal Weight: Short Term 158 lb 3.2 oz (71.8 kg)    Goal Weight: Long Term 145 lb (65.8 kg)    Expected Outcomes Short Term: Continue to assess and modify interventions until short term weight is achieved;Long Term: Adherence to nutrition and physical activity/exercise program aimed toward attainment of established weight goal;Understanding recommendations for meals to include 15-35% energy as protein, 25-35% energy from fat, 35-60% energy from carbohydrates, less than 200mg  of dietary cholesterol, 20-35 gm of total fiber daily;Understanding of distribution of calorie intake throughout the day with the  consumption of 4-5 meals/snacks;Weight Loss: Understanding of general recommendations for a balanced deficit meal plan, which promotes 1-2 lb weight  loss per week and includes a negative energy balance of [phone] kcal/d    Heart Failure Yes    Intervention Provide a combined exercise and nutrition program that is supplemented with education, support and counseling about heart failure. Directed toward relieving symptoms such as shortness of breath, decreased exercise tolerance, and extremity edema.    Expected Outcomes Improve functional capacity of life;Short term: Attendance in program 2-3 days a week with increased exercise capacity. Reported lower sodium intake. Reported increased fruit and vegetable intake. Reports medication compliance.;Short term: Daily weights obtained and reported for increase. Utilizing diuretic protocols set by physician.;Long term: Adoption of self-care skills and reduction of barriers for early signs and symptoms recognition and intervention leading to self-care maintenance.    Hypertension Yes    Intervention Provide education on lifestyle modifcations including regular physical activity/exercise, weight management, moderate sodium restriction and increased consumption of fresh fruit, vegetables, and low fat dairy, alcohol moderation, and smoking cessation.;Monitor prescription use compliance.    Expected Outcomes Short Term: Continued assessment and intervention until BP is < 140/42mm HG in hypertensive participants. < 130/48mm HG in hypertensive participants with diabetes, heart failure or chronic kidney disease.;Long Term: Maintenance of blood pressure at goal levels.    Lipids Yes    Intervention Provide education and support for participant on nutrition & aerobic/resistive exercise along with prescribed medications to achieve LDL 70mg , HDL >40mg .    Expected Outcomes Short Term: Participant states understanding of desired cholesterol values and is compliant with  medications prescribed. Participant is following exercise prescription and nutrition guidelines.;Long Term: Cholesterol controlled with medications as prescribed, with individualized exercise RX and with personalized nutrition plan. Value goals: LDL < 70mg , HDL > 40 mg.          Education:Diabetes - Individual verbal and written instruction to review signs/symptoms of diabetes, desired ranges of glucose level fasting, after meals and with exercise. Acknowledge that pre and post exercise glucose checks will be done for 3 sessions at entry of program.   Core Components/Risk Factors/Patient Goals Review:   Goals and Risk Factor Review     Row Name 03/09/24 0958             Core Components/Risk Factors/Patient Goals Review   Personal Goals Review Weight Management/Obesity;Heart Failure;Lipids       Review Alizandra stated that her weight has been steady. She still wants to work towards her weight loss goal, but the importance of weighing daily and monitoring for symptoms of heart failure was reviewed. She continues to take all her cholesterol medication as well and follows up with her doctor for regular appointments and lab work.       Expected Outcomes Short: weight daily and monitor for signs of heart failure. Long: continue to control cardiac risk factors.          Core Components/Risk Factors/Patient Goals at Discharge (Final Review):   Goals and Risk Factor Review - 03/09/24 0958       Core Components/Risk Factors/Patient Goals Review   Personal Goals Review Weight Management/Obesity;Heart Failure;Lipids    Review Vani stated that her weight has been steady. She still wants to work towards her weight loss goal, but the importance of weighing daily and monitoring for symptoms of heart failure was reviewed. She continues to take all her cholesterol medication as well and follows up with her doctor for regular appointments and lab work.    Expected Outcomes Short: weight daily and monitor  for signs of heart failure. Long: continue  to control cardiac risk factors.          ITP Comments:  ITP Comments     Row Name 12/28/23 1620 01/04/24 1350 01/06/24 1045 01/27/24 1317 02/24/24 1111   ITP Comments Virtual Visit completed. Patient informed on EP and RD appointment and 6 Minute walk test. Patient also informed of patient health questionnaires on My Chart. Patient Verbalizes understanding. Visit diagnosis can be found in [facility] 11/20/2023. Completed and gym orientation for cardiac rehab. Initial ITP created and sent for review to Dr. Oneil Pinal, Medical Director. First full day of exercise!  Patient was oriented to gym and equipment including functions, settings, policies, and procedures.  Patient's individual exercise prescription and treatment plan were reviewed.  All starting workloads were established based on the results of the 6 minute walk test done at initial orientation visit.  The plan for exercise progression was also introduced and progression will be customized based on patient's performance and goals. 30 Day review completed. Medical Director ITP review done, changes made as directed, and signed approval by Medical Director. New to program. 30 Day review completed. Medical Director ITP review done, changes made as directed, and signed approval by Medical Director.    Row Name 03/23/24 1120 04/20/24 1017         ITP Comments 30 Day review completed. Medical Director ITP review done, changes made as directed, and signed approval by Medical Director. 30 Day review completed. Medical Director ITP review done, changes made as directed, and signed approval by Medical Director.         Comments: 30 day review

## 2024-04-21 ENCOUNTER — Encounter

## 2024-04-21 ENCOUNTER — Other Ambulatory Visit: Payer: Self-pay | Admitting: Internal Medicine

## 2024-04-21 DIAGNOSIS — Z1231 Encounter for screening mammogram for malignant neoplasm of breast: Secondary | ICD-10-CM

## 2024-04-27 ENCOUNTER — Encounter

## 2024-04-28 ENCOUNTER — Encounter: Attending: Cardiovascular Disease | Admitting: *Deleted

## 2024-04-28 DIAGNOSIS — Z955 Presence of coronary angioplasty implant and graft: Secondary | ICD-10-CM | POA: Diagnosis present

## 2024-04-28 NOTE — Progress Notes (Signed)
 Daily Session Note  Patient Details  Name: Sara Hodges MRN: [id number] Date of Birth: 1951/03/20 Referring Provider:   Flowsheet Row Cardiac Rehab from 01/04/2024 in [facility] Cardiac and Pulmonary Rehab  Referring Provider Clarinda Sharper MD    Encounter Date: 04/28/2024  Check In:  Session Check In - 04/28/24 1721       Check-In   Supervising physician immediately available to respond to emergencies See telemetry face sheet for immediately available ER MD    Location [facility]-Cardiac & Pulmonary Rehab    Staff Present Maxon Conetta BS, Exercise Physiologist;Joseph Rolinda RCP,RRT,BSRT;Ahnesty Finfrock Tressa RN,BSN    Virtual Visit No    Medication changes reported     No    Fall or balance concerns reported    No    Warm-up and Cool-down Performed on first and last piece of equipment    Resistance Training Performed Yes    VAD Patient? No      Pain Assessment   Currently in Pain? No/denies             Social History   Tobacco Use  Smoking Status Never  Smokeless Tobacco Never    Goals Met:  Independence with exercise equipment Exercise tolerated well No report of concerns or symptoms today Strength training completed today  Goals Unmet:  Not Applicable  Comments: Pt able to follow exercise prescription today without complaint.  Will continue to monitor for progression.    Dr. Oneil Pinal is Medical Director for [facility] Cardiac Rehabilitation.  Dr. Fuad Aleskerov is Medical Director for [facility] Pulmonary Rehabilitation.

## 2024-05-03 ENCOUNTER — Ambulatory Visit: Payer: Self-pay | Admitting: Surgery

## 2024-05-03 NOTE — H&P (Signed)
 Subjective:   CC: Breast abscess of female [N61.1]  HPI:  Returns for evaluation of above.   History of Present Illness Sara Hodges is a 73 year old female who presents with left breast discomfort.  She experiences sharp discomfort under her left breast, described as a node causing pain, since an ultrasound-guided aspiration for a left breast abscess on April 15, 2024. She completed a course of antibiotics following the procedure. Her symptoms had completely resolved for a period of time after the drainage procedure, but has recurred. Her pain has been decreasing over the past few days, but she is concerned about the possibility of the abscess reforming. No fever, chills, diarrhea, constipation, or headache.     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), Cardiac pacemaker (12/01/2017), Cardiomyopathy, secondary (CMS/HHS-HCC), Carotid artery occlusion, CHF (congestive heart failure) (CMS/HHS-HCC), Coronary artery disease, Encounter for care of pacemaker (09/17/2018), Heart disease (5 yrs ago), History of atrial fibrillation (12/08/2017), HTN (hypertension), Hyperlipidemia, Migraines, Motion sickness, Myocardial infarction (CMS/HHS-HCC), Neuropathic pain of foot, NSVT (nonsustained ventricular tachycardia) (CMS/HHS-HCC) (09/28/2018), Pacemaker, Pancreatic cyst (HHS-HCC) (12/25/2014), Pleurisy, Preoperative evaluation to rule out surgical contraindication (12/29/2022), and Rheumatoid arthritis (CMS/HHS-HCC) (Hand and legs).  Past Surgical History:  has a past surgical history that includes Abdominal hysterectomy (1980s); hand surgery (Left); foot surgery (Left); Colonoscopy (03/19/2006, 08/25/2011); egd (08/25/2011); Cardiac Catheterization (06/2014); Colonoscopy; Upper gastrointestinal endoscopy (08/2019); Upper gastrointestinal endoscopy (N/A, 06/25/2016); Insert /  replace / remove pacemaker (2018); esophagogastrodoudenoscopy w/ultrasound guided aspiration/biopsy (N/A, 09/13/2019); Hysterectomy; arthroscopic rotator cuff repair (Right, 02/04/2022); arthroscopy shoulder w/debridement (Right, 02/04/2022); tenodesis biceps w/transplantation long tendon open (Right, 02/04/2022); arthroscopic subacromial decomp (Right, 02/04/2022); Upper gastrointestinal endoscopy (N/A, 12/03/2022); extraction cataract extracapsular w/insertion intraocular prosthesis (Right, 10/05/2023); and extraction cataract extracapsular w/insertion intraocular prosthesis (Left, 10/19/2023).  Family History: family history includes Allergies in her mother; Breast cancer in an other family member; Cancer in her mother and sister; Cardiomyopathy (Abnormal function of the heart muscle) in her mother; Colon cancer in an other family member; Colon polyps in an other family member; Heart disease in her mother; High blood pressure (Hypertension) in an other family member; Liver disease in her maternal grandfather; Lung cancer in her mother; Myocardial Infarction (Heart attack) in an other family member; No Known Problems in her daughter and daughter; Prostate cancer in her father; Rheum arthritis in her daughter; Uterine cancer in her sister.  Social History:  reports that she has never smoked. She has never used smokeless tobacco. She reports that she does not currently use alcohol. She reports that she does not use drugs.  Current Medications: has a current medication list which includes the following prescription(s): acetaminophen , clopidogrel, ergocalciferol  (vitamin d2), repatha sureclick, folic acid , furosemide , metoprolol succinate, pantoprazole, polyethylene glycol, entresto , aspirin , and nitroglycerin.  Allergies:  Allergies  Allergen Reactions   Bactrim [Sulfamethoxazole-Trimethoprim] Hives and Abdominal Pain   Beta-Blockers (Beta-Adrenergic Blocking Agts) Other (See Comments)    Second degree heart  block    Statins-Hmg-Coa Reductase Inhibitors Muscle Pain    Multiple statins including atorvastatin, rosuva, prava   Amoxicillin-Pot Clavulanate Nausea and Other (See Comments)    Has patient had a PCN reaction causing immediate rash, facial/tongue/throat swelling, SOB or lightheadedness with hypotension: No Has patient had a PCN reaction causing severe rash involving mucus membranes or skin necrosis: No Has patient had a PCN reaction that required hospitalization No Has patient had a PCN reaction occurring within  the last 10 years: unknown If all of the above answers are NO, then may proceed with Cephalosporin use.   Tramadol Nausea    ROS:  A 15 point review of systems was performed and pertinent positives and negatives noted in HPI   Objective:     BP 125/66   Pulse 79   Ht 167.6 cm (5' 6)   Wt 75.3 kg (166 lb)   BMI 26.79 kg/m   Constitutional :  No distress, cooperative, alert  Lymphatics/Throat:  Supple with no lymphadenopathy  Respiratory:  Clear to auscultation bilaterally  Cardiovascular:  Regular rate and rhythm  Gastrointestinal: Soft, non-tender, non-distended, no organomegaly.  Musculoskeletal: Steady gait and movement  Skin: Cool and moist. Chaperone present for exam of left breast, where evidence of healing abscess noted near mammary fold.  Some TTP but no obvious induration, fluctuance, skin changes to indicate active infection.  No easily palpable cyst like structure.  Psychiatric: Normal affect, non-agitated, not confused         LABS:  N/a   RADS: CLINICAL DATA:  Patient is status post ultrasound-guided aspiration  of a LEFT abscess which demonstrated Gram positive cocci on gram  stain without growth. Patient was prescribed and has completed 5  days of doxycycline . Patient reports improvement in symptomatology  but a persistent dermal area of concern.   EXAM:  ULTRASOUND OF THE LEFT BREAST   COMPARISON:  Previous exams   FINDINGS:  On  physical exam, there is a hyperpigmented superficial shiny raised  area along the LEFT lower breast. There is adjacent  hyperpigmentation. No significant erythema.   Targeted ultrasound was performed of the LEFT breast. At 6 o'clock 7  cm from the nipple, there is revisualization of an intradermal  heterogeneous collection. This spans 2.5 x 3.1 x 0.6 cm, previously  3.2 by 0.9 by 3.0 cm when measured similarly. It is more planar in  appearance on today's exam with a predominately phlegmon in  appearance with hyperemia and trace interdigitating fluid. Overall  extent of interdigitating fluid is decreased compared to prior.   IMPRESSION:  1. Decrease in size of a LEFT breast intradermal abscess which may  reflect a superimposed infection of a chronic intradermal structure  such as an epidermal inclusion cyst. The residual area is  predominantly phlegmonous in appearance. Recommend clinical  follow-up to resolution with follow-up with provider/surgeon for any  additional antibiotics if clinically indicated. Recommend  consideration of dermatology/surgical consultation for excision  after resolution of acute inflammatory state.   RECOMMENDATION:  1. Recommend clinical follow-up to resolution with follow-up with  provider/surgeon for any additional antibiotics if clinically  indicated. Recommend consideration of dermatology/surgical  consultation for excision after resolution of acute inflammatory  state.  2. Should infection symptoms worsen, repeat ultrasound with  potential aspiration could be considered.  3. Bilateral annual screening mammogram, due October 2025   I have discussed the findings and recommendations with the patient.  If applicable, a reminder letter will be sent to the patient  regarding the next appointment.   BI-RADS CATEGORY  2: Benign.    Electronically Signed    By: Corean Salter M.D.    On: 04/15/2024 11:38   Assessment:      Breast abscess of  female [N61.1]-left.  likely infected cyst  Plan:     1. Breast abscess of female [N61.1] skin changes persist, with report concerning for subclinical infection, likely recurrent.  With US  findings above, will proceed with formal excision  to prevent recurrent issues, understanding higher risk due to comorbidities  Cardiology to risk stratify and also recommend when to stop plavix and possibly aspirin . Will schedule  labs/images/medications/previous chart entries reviewed personally and relevant changes/updates noted above.

## 2024-05-04 ENCOUNTER — Encounter

## 2024-05-05 ENCOUNTER — Encounter

## 2024-05-11 ENCOUNTER — Encounter

## 2024-05-11 ENCOUNTER — Other Ambulatory Visit: Payer: Self-pay | Admitting: Family Medicine

## 2024-05-11 DIAGNOSIS — M5414 Radiculopathy, thoracic region: Secondary | ICD-10-CM

## 2024-05-12 ENCOUNTER — Encounter

## 2024-05-13 ENCOUNTER — Encounter: Payer: Self-pay | Admitting: Family Medicine

## 2024-05-16 ENCOUNTER — Ambulatory Visit
Admission: RE | Admit: 2024-05-16 | Discharge: 2024-05-16 | Disposition: A | Discharge status: Home / Self Care | Source: Ambulatory Visit | Attending: Family Medicine | Admitting: Family Medicine

## 2024-05-16 DIAGNOSIS — M5414 Radiculopathy, thoracic region: Secondary | ICD-10-CM

## 2024-05-18 ENCOUNTER — Encounter

## 2024-05-18 DIAGNOSIS — Z955 Presence of coronary angioplasty implant and graft: Secondary | ICD-10-CM

## 2024-05-18 NOTE — Progress Notes (Signed)
 Cardiac Individual Treatment Plan  Patient Details  Name: Sara Hodges MRN: [id number] Date of Birth: Jul 12, 1951 Referring Provider:   Flowsheet Row Cardiac Rehab from 01/04/2024 in [facility] Cardiac and Pulmonary Rehab  Referring Provider Clarinda Sharper MD    Initial Encounter Date:  Flowsheet Row Cardiac Rehab from 01/04/2024 in [facility] Cardiac and Pulmonary Rehab  Date 01/04/24    Visit Diagnosis: Status post coronary artery stent placement  Patient's Home Medications on Admission:  Current Outpatient Medications:    acetaminophen  (TYLENOL ) 500 MG tablet, Take by mouth., Disp: , Rfl:    aspirin  EC 81 MG tablet, Take 81 mg by mouth daily. , Disp: , Rfl:    celecoxib (CELEBREX) 200 MG capsule, , Disp: , Rfl:    clopidogrel (PLAVIX) 75 MG tablet, Take 75 mg by mouth daily., Disp: , Rfl:    ENTRESTO  49-51 MG, Take 1 tablet by mouth 2 (two) times daily. (Patient not taking: Reported on 12/28/2023), Disp: , Rfl:    ENTRESTO  97-103 MG, Take 1 tablet by mouth 2 (two) times daily., Disp: , Rfl:    folic acid  (FOLVITE ) 1 MG tablet, Take 1 mg by mouth daily. (Patient not taking: Reported on 12/28/2023), Disp: , Rfl:    folic acid  (FOLVITE ) 1 MG tablet, Take by mouth., Disp: , Rfl:    furosemide  (LASIX ) 20 MG tablet, Take 20 mg by mouth daily as needed for fluid., Disp: , Rfl:    levocetirizine (XYZAL) 5 MG tablet, Take 1 tablet by mouth every evening., Disp: , Rfl:    metoprolol succinate (TOPROL-XL) 25 MG 24 hr tablet, , Disp: , Rfl:    metoprolol succinate (TOPROL-XL) 25 MG 24 hr tablet, Take by mouth., Disp: , Rfl:    nitroGLYCERIN (NITROSTAT) 0.4 MG SL tablet, Place under the tongue., Disp: , Rfl:    pantoprazole (PROTONIX) 40 MG tablet, Take 40 mg by mouth daily., Disp: , Rfl:    polyethylene glycol powder (GLYCOLAX/MIRALAX) 17 GM/SCOOP powder, Take by mouth. (Patient not taking: Reported on 12/28/2023), Disp: , Rfl:    predniSONE (DELTASONE) 10 MG tablet, 2 tabs daily for 4 days, 1 tab daily for  4 days, Disp: , Rfl:    spironolactone  (ALDACTONE ) 25 MG tablet, Take 25 mg by mouth daily. (Patient not taking: Reported on 12/28/2023), Disp: , Rfl:    triamcinolone ointment (KENALOG) 0.1 %, Apply twice daily to raised itchy areas until smooth (typically 5-10 days) (Patient not taking: Reported on 12/28/2023), Disp: , Rfl:    Vitamin D , Ergocalciferol , (DRISDOL ) 50000 UNITS CAPS capsule, Take 50,000 Units by mouth every 7 (seven) days. Takes on Sunday, Disp: , Rfl:   Past Medical History: Past Medical History:  Diagnosis Date   CAD (coronary artery disease)    CHF (congestive heart failure) (HCC)    HLD (hyperlipidemia)    Hypertension    Pancreatic cyst    Pancreatic mass     Tobacco Use: Social History   Tobacco Use  Smoking Status Never  Smokeless Tobacco Never    Labs: Review Flowsheet        No data to display           Exercise Target Goals: Exercise Program Goal: Individual exercise prescription set using results from initial 6 min walk test and THRR while considering  patient's activity barriers and safety.   Exercise Prescription Goal: Initial exercise prescription builds to 30-45 minutes a day of aerobic activity, 2-3 days per week.  Home exercise guidelines will be given  to patient during program as part of exercise prescription that the participant will acknowledge.   Education: Aerobic Exercise: - Group verbal and visual presentation on the components of exercise prescription. Introduces F.I.T.T principle from ACSM for exercise prescriptions.  Reviews F.I.T.T. principles of aerobic exercise including progression. Written material provided at class time.   Education: Resistance Exercise: - Group verbal and visual presentation on the components of exercise prescription. Introduces F.I.T.T principle from ACSM for exercise prescriptions  Reviews F.I.T.T. principles of resistance exercise including progression. Written material provided at class time.     Education: Exercise & Equipment Safety: - Individual verbal instruction and demonstration of equipment use and safety with use of the equipment. Flowsheet Row Cardiac Rehab from 03/23/2024 in [facility] Cardiac [facility]monary Rehab  Date 01/04/24  Educator MB  Instruction Review Code 1- Verbalizes Understanding    Education: Exercise Physiology & General Exercise Guidelines: - Group verbal and written instruction with models to review the exercise physiology of the cardiovascular system and associated critical values. Provides general exercise guidelines with specific guidelines to those with heart or lung disease. Written material provided at class time.   Education: Flexibility, Balance, Mind/Body Relaxation: - Group verbal and visual presentation with interactive activity on the components of exercise prescription. Introduces F.I.T.T principle from ACSM for exercise prescriptions. Reviews F.I.T.T. principles of flexibility and balance exercise training including progression. Also discusses the mind body connection.  Reviews various relaxation techniques to help reduce and manage stress (i.e. Deep breathing, progressive muscle relaxation, and visualization). Balance handout provided to take home. Written material provided at class time.   Activity Barriers & Risk Stratification:  Activity Barriers & Cardiac Risk Stratification - 01/04/24 1352       Activity Barriers & Cardiac Risk Stratification   Activity Barriers Back Problems;Other (comment)    Comments L leg aches (mainly at night), occasional low and mid back pain due to degenerative disc disease    Cardiac Risk Stratification Moderate          6 Minute Walk:  6 Minute Walk     Row Name 01/04/24 1350         6 Minute Walk   Phase Initial     Distance 1390 feet     Walk Time 6 minutes     # of Rest Breaks 0     MPH 2.63     METS 2.83     RPE 9     Perceived Dyspnea  0     VO2 Peak 9.89     Symptoms No     Resting HR 69  bpm     Resting BP 108/70     Resting Oxygen Saturation  99 %     Exercise Oxygen Saturation  during 6 min walk 100 %     Max Ex. HR 90 bpm     Max Ex. BP 146/68     2 Minute Post BP 118/66        Oxygen Initial Assessment:   Oxygen Re-Evaluation:   Oxygen Discharge (Final Oxygen Re-Evaluation):   Initial Exercise Prescription:  Initial Exercise Prescription - 01/04/24 1300       Date of Initial Exercise RX and Referring Provider   Date 01/04/24    Referring Provider Clarinda Sharper MD      Oxygen   Maintain Oxygen Saturation 88% or higher      Recumbant Bike   Level 2    RPM 50    Watts 20  Minutes 15    METs 2.83      NuStep   Level 2    SPM 80    Minutes 15    METs 2.83      REL-XR   Level 1    Speed 50    Minutes 15    METs 2.83      T5 Nustep   Level 2    SPM 80    Minutes 15    METs 2.83      Track   Laps 33    Minutes 15    METs 2.79      Prescription Details   Frequency (times per week) 2    Duration Progress to 30 minutes of continuous aerobic without signs/symptoms of physical distress      Intensity   THRR 40-80% of Max Heartrate 100-131    Ratings of Perceived Exertion 11-13    Perceived Dyspnea 0-4      Progression   Progression Continue to progress workloads to maintain intensity without signs/symptoms of physical distress.      Resistance Training   Training Prescription Yes    Weight 4 lb    Reps 10-15          Perform Capillary Blood Glucose checks as needed.  Exercise Prescription Changes:   Exercise Prescription Changes     Row Name 01/04/24 1300 01/14/24 1800 01/26/24 1500 02/11/24 1800 02/25/24 1600     Response to Exercise   Blood Pressure (Admit) 108/70 102/58 102/60 134/62 122/60   Blood Pressure (Exercise) 146/68 130/60 148/62 144/58 122/58   Blood Pressure (Exit) 118/66 108/60 110/62 118/70 110/62   Heart Rate (Admit) 69 bpm 82 bpm 77 bpm 72 bpm 79 bpm   Heart Rate (Exercise) 90 bpm 90 bpm 116  bpm 116 bpm 115 bpm   Heart Rate (Exit) 69 bpm 74 bpm 93 bpm 85 bpm 89 bpm   Oxygen Saturation (Admit) 99 % -- -- -- --   Oxygen Saturation (Exercise) 100 % -- -- -- --   Oxygen Saturation (Exit) 100 % -- -- -- --   Rating of Perceived Exertion (Exercise) 9 11 13 13 14    Perceived Dyspnea (Exercise) 0 -- -- -- --   Symptoms none none none none none   Comments results 1st full day of exercise -- -- --   Duration Progress to 30 minutes of  aerobic without signs/symptoms of physical distress Continue with 30 min of aerobic exercise without signs/symptoms of physical distress. Continue with 30 min of aerobic exercise without signs/symptoms of physical distress. Continue with 30 min of aerobic exercise without signs/symptoms of physical distress. Continue with 30 min of aerobic exercise without signs/symptoms of physical distress.   Intensity THRR New THRR unchanged THRR unchanged THRR unchanged THRR unchanged     Progression   Progression Continue to progress workloads to maintain intensity without signs/symptoms of physical distress. Continue to progress workloads to maintain intensity without signs/symptoms of physical distress. Continue to progress workloads to maintain intensity without signs/symptoms of physical distress. Continue to progress workloads to maintain intensity without signs/symptoms of physical distress. Continue to progress workloads to maintain intensity without signs/symptoms of physical distress.   Average METs 2.83 3.38 3.07 3.17 3.38     Resistance Training   Training Prescription -- Yes Yes Yes Yes   Weight -- 4 lb 5 lb 5 lb 5 lb   Reps -- 10-15 10-15 10-15 10-15     Interval Training  Interval Training -- No No No No     Treadmill   MPH -- -- 2.5 2.6 --   Grade -- -- 0 0.5 --   Minutes -- -- 15 15 --   METs -- -- 2.91 3.17 --     Recumbant Bike   Level -- -- 5 5 8    Watts -- -- 39 20 57   Minutes -- -- 15 15 15    METs -- -- 2.81 2.8 4.28     NuStep    Level -- 2 4 4 4    Minutes -- 15 15 15 15    METs -- 5 4 4.4 3.9     REL-XR   Level -- -- 6 -- --   Minutes -- -- 15 -- --   METs -- -- 4.5 -- --     T5 Nustep   Level -- -- 3 3 2    Minutes -- -- 15 15 15    METs -- -- 2.2 2.3 3     Track   Laps -- 14 14 -- --   Minutes -- 15 15 -- --   METs -- 1.76 1.76 -- --     Oxygen   Maintain Oxygen Saturation -- 88% or higher 88% or higher 88% or higher 88% or higher    Row Name 03/09/24 0900 03/10/24 0900 03/24/24 1500 04/06/24 0900 05/04/24 0900     Response to Exercise   Blood Pressure (Admit) -- 116/54 108/62 110/58 114/52   Blood Pressure (Exit) -- 106/58 96/62 112/70 110/58   Heart Rate (Admit) -- 74 bpm 68 bpm 74 bpm 81 bpm   Heart Rate (Exercise) -- 115 bpm 117 bpm 117 bpm 110 bpm   Heart Rate (Exit) 89 bpm 88 bpm 94 bpm 77 bpm 77 bpm   Rating of Perceived Exertion (Exercise) -- 13 15 12 13    Symptoms -- none none none none   Duration Continue with 30 min of aerobic exercise without signs/symptoms of physical distress. Continue with 30 min of aerobic exercise without signs/symptoms of physical distress. Continue with 30 min of aerobic exercise without signs/symptoms of physical distress. Continue with 30 min of aerobic exercise without signs/symptoms of physical distress. Continue with 30 min of aerobic exercise without signs/symptoms of physical distress.   Intensity THRR unchanged THRR unchanged THRR unchanged THRR unchanged THRR unchanged     Progression   Progression Continue to progress workloads to maintain intensity without signs/symptoms of physical distress. Continue to progress workloads to maintain intensity without signs/symptoms of physical distress. Continue to progress workloads to maintain intensity without signs/symptoms of physical distress. Continue to progress workloads to maintain intensity without signs/symptoms of physical distress. Continue to progress workloads to maintain intensity without signs/symptoms of  physical distress.   Average METs 3.38 3.61 3.42 4.73 2.78     Resistance Training   Training Prescription Yes Yes Yes Yes Yes   Weight 5 lb 5 lb 5 lb 5 lb 5 lb   Reps 10-15 10-15 10-15 10-15 10-15     Interval Training   Interval Training No No No No No     Treadmill   MPH -- -- 2.6 2.6 --   Grade -- -- 2.5 2.6 --   Minutes -- -- 15 15 --   METs -- -- 3.89 3.89 --     Recumbant Bike   Level 8 8 8  -- 3   Watts 57 54 51 -- 26   Minutes 15 15 15  -- 15  METs 4.28 4.32 4.28 -- 3.07     NuStep   Level 4 4 3  -- --   Minutes 15 15 13  -- --   METs 3.9 4.6 2 -- --     REL-XR   Level -- -- -- 9 --   Minutes -- -- -- 15 --   METs -- -- -- 6.9 --     T5 Nustep   Level 2 4 4  -- 5   Minutes 15 15 15  -- 15   METs 3 2.2 2.5 -- 2.5     Biostep-RELP   Level -- -- 2 -- --   Minutes -- -- 15 -- --   METs -- -- 2 -- --     Rower   Level -- -- -- 10 --   Minutes -- -- -- 15 --   METs -- -- -- 30 --     Home Exercise Plan   Plans to continue exercise at Home (comment)  walk Home (comment)  walk Home (comment)  walk Home (comment)  walk Home (comment)  walk   Frequency Add 2 additional days to program exercise sessions. Add 2 additional days to program exercise sessions. Add 2 additional days to program exercise sessions. Add 2 additional days to program exercise sessions. Add 2 additional days to program exercise sessions.   Initial Home Exercises Provided 03/09/24 03/09/24 03/09/24 03/09/24 03/09/24     Oxygen   Maintain Oxygen Saturation 88% or higher 88% or higher 88% or higher 88% or higher 88% or higher      Exercise Comments:   Exercise Comments     Row Name 01/06/24 1045           Exercise Comments First full day of exercise!  Patient was oriented to gym and equipment including functions, settings, policies, and procedures.  Patient's individual exercise prescription and treatment plan were reviewed.  All starting workloads were established based on the results of  the 6 minute walk test done at initial orientation visit.  The plan for exercise progression was also introduced and progression will be customized based on patient's performance and goals.          Exercise Goals and Review:   Exercise Goals     Row Name 01/04/24 1356             Exercise Goals   Increase Physical Activity Yes       Intervention Provide advice, education, support and counseling about physical activity/exercise needs.;Develop an individualized exercise prescription for aerobic and resistive training based on initial evaluation findings, risk stratification, comorbidities and participant's personal goals.       Expected Outcomes Short Term: Attend rehab on a regular basis to increase amount of physical activity.;Long Term: Add in home exercise to make exercise part of routine and to increase amount of physical activity.;Long Term: Exercising regularly at least 3-5 days a week.       Increase Strength and Stamina Yes       Intervention Provide advice, education, support and counseling about physical activity/exercise needs.;Develop an individualized exercise prescription for aerobic and resistive training based on initial evaluation findings, risk stratification, comorbidities and participant's personal goals.       Expected Outcomes Short Term: Increase workloads from initial exercise prescription for resistance, speed, and METs.;Short Term: Perform resistance training exercises routinely during rehab and add in resistance training at home;Long Term: Improve cardiorespiratory fitness, muscular endurance and strength as measured by increased METs and functional capacity (  )       Able to understand and use rate of perceived exertion (RPE) scale Yes       Intervention Provide education and explanation on how to use RPE scale       Expected Outcomes Short Term: Able to use RPE daily in rehab to express subjective intensity level;Long Term:  Able to use RPE to guide intensity  level when exercising independently       Able to understand and use Dyspnea scale Yes       Intervention Provide education and explanation on how to use Dyspnea scale       Expected Outcomes Short Term: Able to use Dyspnea scale daily in rehab to express subjective sense of shortness of breath during exertion;Long Term: Able to use Dyspnea scale to guide intensity level when exercising independently       Knowledge and understanding of Target Heart Rate Range (THRR) Yes       Intervention Provide education and explanation of THRR including how the numbers were predicted and where they are located for reference       Expected Outcomes Short Term: Able to state/look up THRR;Short Term: Able to use daily as guideline for intensity in rehab;Long Term: Able to use THRR to govern intensity when exercising independently       Able to check pulse independently Yes       Intervention Provide education and demonstration on how to check pulse in carotid and radial arteries.;Review the importance of being able to check your own pulse for safety during independent exercise       Expected Outcomes Short Term: Able to explain why pulse checking is important during independent exercise;Long Term: Able to check pulse independently and accurately       Understanding of Exercise Prescription Yes       Intervention Provide education, explanation, and written materials on patient's individual exercise prescription       Expected Outcomes Short Term: Able to explain program exercise prescription;Long Term: Able to explain home exercise prescription to exercise independently          Exercise Goals Re-Evaluation :  Exercise Goals Re-Evaluation     Row Name 01/06/24 1045 01/14/24 1805 01/26/24 1527 02/11/24 1813 02/25/24 1629     Exercise Goal Re-Evaluation   Exercise Goals Review Able to understand and use rate of perceived exertion (RPE) scale;Knowledge and understanding of Target Heart Rate Range  (THRR);Understanding of Exercise Prescription;Able to understand and use Dyspnea scale Increase Physical Activity;Understanding of Exercise Prescription;Increase Strength and Stamina Increase Physical Activity;Understanding of Exercise Prescription;Increase Strength and Stamina Increase Physical Activity;Understanding of Exercise Prescription;Increase Strength and Stamina Increase Physical Activity;Understanding of Exercise Prescription;Increase Strength and Stamina   Comments Reviewed RPE and dyspnea scale, THR and program prescription with pt today.  Pt voiced understanding and was given a copy of goals to take home. Sara Hodges completed her first exercise session in this review. She was able to do level 2 on the T4 nustep and 14 laps on the track. We will continue to monitor her progress in the program. Sara Hodges is doing well in the rehab. She improved to level 3 on the T5 nustep, level 4 on the T4 nustep, level 5 on the recumbent bike, and level 6 on the XR. She also began using the treadmill at a speed of 2.5 mph with no incline. We will continue to monitor her progress in the program. Sara Hodges is doing well in the rehab. She increased her workload  on the treadmill to a speed of 2.6 mph and incline of 0.5%. She maintained level 4 on the T4 nustep, level 3 on the T5 nustep, and level 5 on the recumbent bike. We will continue to monitor her progress in the program. Sara Hodges continues to do well in rehab. She recently improved to level 8 on the recumbent bike. She has not done any walking on the treadmill since the last review. We will continue to monitor her progress in the program.   Expected Outcomes Short: Use RPE daily to regulate intensity. Long: Follow program prescription in THR. Short: Continue to follow current exercise prescription. Long: Continue exercise to improve strength and stamina. Short: Continue to progressively increase treadmill workload. Long: Continue exercise to improve strength and stamina. Short:  Try level 4 on the T5 nustep. Long: Continue exercise to improve strength and stamina. Short: Return to walking on the treadmill in the program. Long: Continue exercise to improve strength and stamina.    Row Name 03/09/24 9045 03/10/24 0924 03/24/24 1535 03/24/24 1536 03/24/24 1539     Exercise Goal Re-Evaluation   Exercise Goals Review Increase Physical Activity;Able to understand and use rate of perceived exertion (RPE) scale;Knowledge and understanding of Target Heart Rate Range (THRR);Understanding of Exercise Prescription;Increase Strength and Stamina;Able to understand and use Dyspnea scale;Able to check pulse independently Increase Physical Activity;Increase Strength and Stamina;Understanding of Exercise Prescription Increase Physical Activity;Increase Strength and Stamina;Understanding of Exercise Prescription -- --   Comments Reviewed home exercise with pt today.  Pt plans to walk for exercise.  Reviewed THR, pulse, RPE, sign and symptoms, pulse oximetery and when to call 911 or MD.  Also discussed weather considerations and indoor options.  Pt voiced understanding. Sara Hodges is doing well in rehab. She recently improved to level 4 on the T5 nustep, and continued to work at level 8 on the recumbent bike. She has not done any walking on the treadmill since the last review. We will continue to monitor her progress in the program. Sara Hodges continues to do well with her exercise progression.  She Sara Hodges continues to do well with her exercise progression.  She increased the RB from level 1 to level 8. T5 from level 1 to level 4, BS from level 1 to level 2 and T4 from levle 2 to level 3. Maintained Bike at level 1. Takeesha continues to do well with her exercise progression.  She increased the RB from level 1 to level 8. T5 from level 1 to level 4, BS from level 1 to level 2 and T4 from levle 2 to level 3. Maintained Bike at level 1.  WIll continue to monitor her progress   Expected Outcomes Short: add 1-2 days a  week of home exercise on off days of cardiac rehab. Long: maintain independent exercise routine upon graduation from cardiac rehab. Short: Return to walking the treadmill in rehab. Long: Continue exercise to improve strength and stamina. -- -- STG continue with good attendance, exercise progression as tolerated  LTG exercise progression continues after discharge    Row Name 04/06/24 0928 04/20/24 1118 05/04/24 0908         Exercise Goal Re-Evaluation   Exercise Goals Review Increase Physical Activity;Increase Strength and Stamina;Understanding of Exercise Prescription Increase Physical Activity;Increase Strength and Stamina;Understanding of Exercise Prescription Increase Physical Activity;Increase Strength and Stamina;Understanding of Exercise Prescription     Comments Sara Hodges is doing well in rehab. She was able to use the XR at level 9 and was also able  to maintain a treadmill workload of 2.6mph and 2.5% incline. She was also able to add the rowing machine at level 10. We will continue to monitor her progress in the program. Sara Hodges has not attended rehab since 07/09. We will stay in contact with her to determine her status in the program. Sara Hodges has returned to rehab and is doing well. She was able to attend one session during this review period. During her session she was able to increase from level 4 to 5 on the T5 nustep. We will continue to monitor her progress in the program.     Expected Outcomes Short: Improve on . Long: Continue exercise to improve strength and stamina. Short: Return to rehab. Long: Continue exercise to improve strength and stamina. Short: Attend rehab more frequently. Long: Continue exercise to improve strength and stamina.        Discharge Exercise Prescription (Final Exercise Prescription Changes):  Exercise Prescription Changes - 05/04/24 0900       Response to Exercise   Blood Pressure (Admit) 114/52    Blood Pressure (Exit) 110/58    Heart Rate (Admit) 81 bpm     Heart Rate (Exercise) 110 bpm    Heart Rate (Exit) 77 bpm    Rating of Perceived Exertion (Exercise) 13    Symptoms none    Duration Continue with 30 min of aerobic exercise without signs/symptoms of physical distress.    Intensity THRR unchanged      Progression   Progression Continue to progress workloads to maintain intensity without signs/symptoms of physical distress.    Average METs 2.78      Resistance Training   Training Prescription Yes    Weight 5 lb    Reps 10-15      Interval Training   Interval Training No      Recumbant Bike   Level 3    Watts 26    Minutes 15    METs 3.07      T5 Nustep   Level 5    Minutes 15    METs 2.5      Home Exercise Plan   Plans to continue exercise at Home (comment)   walk   Frequency Add 2 additional days to program exercise sessions.    Initial Home Exercises Provided 03/09/24      Oxygen   Maintain Oxygen Saturation 88% or higher          Nutrition:  Target Goals: Understanding of nutrition guidelines, daily intake of sodium 1500mg , cholesterol 200mg , calories 30% from fat and 7% or less from saturated fats, daily to have 5 or more servings of fruits and vegetables.  Education: Nutrition 1 -Group instruction provided by verbal, written material, interactive activities, discussions, models, and posters to present general guidelines for heart healthy nutrition including macronutrients, label reading, and promoting whole foods over processed counterparts. Education serves as Pensions consultant of discussion of heart healthy eating for all. Written material provided at class time.    Education: Nutrition 2 -Group instruction provided by verbal, written material, interactive activities, discussions, models, and posters to present general guidelines for heart healthy nutrition including sodium, cholesterol, and saturated fat. Providing guidance of habit forming to improve blood pressure, cholesterol, and body weight. Written material  provided at class time.     Biometrics:  Pre Biometrics - 01/04/24 1357       Pre Biometrics   Height 5' 5.39 (1.661 m)    Weight 171 lb 6.4 oz (77.7 kg)  Waist Circumference 33 inches    Hip Circumference 43.5 inches    Waist to Hip Ratio 0.76 %    BMI (Calculated) 28.18    Single Leg Stand 30 seconds           Nutrition Therapy Plan and Nutrition Goals:  Nutrition Therapy & Goals - 01/06/24 1115       Nutrition Therapy   Diet Cardiac, Low Na    Protein (specify units) 80g    Fiber 25 grams    Whole Grain Foods 3 servings    Saturated Fats 15 max. grams    Fruits and Vegetables 5 servings/day    Sodium 2 grams      Personal Nutrition Goals   Nutrition Goal Read labels and reduce sodium intake to below 2300mg . Ideally 1500mg  per day.    Personal Goal #2 Eat 15-30gProtein and 30-60gCarbs at each meal.    Personal Goal #3 Reduce saturated fat, less than 12g per day. Replace bad fats for more heart healthy fats.    Comments Patient drinking mostly water but sometimes has a sweet beverage. She reports she has been working on cutting back on the sugary beverages. She reports eating healthy choices mostly, says sometimes she snacks at night on popcorn with butter or cookies. Sometimes she might cook eggs and bacon for breakfast on weekends but not every week. Encouraged her to enjoy comfort foods from time to time, but should not be regular or routine. Reviewed mediterranean diet handout. Educated on types of fats, sources, how to read labels. Provided guidelines on sodium and saturated fat, recommending she stay below 1500mg  sodium and 15gSat fat. Brainstormed some meal and snack ideas with foods she likes and will eat focusing on balanced plates with adequate protein and controlled sodium.      Intervention Plan   Intervention Prescribe, educate and counsel regarding individualized specific dietary modifications aiming towards targeted core components such as weight,  hypertension, lipid management, diabetes, heart failure and other comorbidities.;Nutrition handout(s) given to patient.    Expected Outcomes Short Term Goal: Understand basic principles of dietary content, such as calories, fat, sodium, cholesterol and nutrients.;Short Term Goal: A plan has been developed with personal nutrition goals set during dietitian appointment.;Long Term Goal: Adherence to prescribed nutrition plan.          Nutrition Assessments:  MEDIFICTS Score Key: >=70 Need to make dietary changes  40-70 Heart Healthy Diet <= 40 Therapeutic Level Cholesterol Diet  Flowsheet Row Cardiac Rehab from 01/13/2024 in [facility] Cardiac and Pulmonary Rehab  Picture Your Plate Total Score on Admission 61   Picture Your Plate Scores: <59 Unhealthy dietary pattern with much room for improvement. 41-50 Dietary pattern unlikely to meet recommendations for good health and room for improvement. 51-60 More healthful dietary pattern, with some room for improvement.  >60 Healthy dietary pattern, although there may be some specific behaviors that could be improved.    Nutrition Goals Re-Evaluation:  Nutrition Goals Re-Evaluation     Row Name 03/09/24 0956             Goals   Comment Sara Hodges reports that she is reading food labels and being mindful of what she is eating. She mostly is trying to eat lean protein and fresh vegetables to make sure she has balanced meals. She is also making an effort to drink more water.       Expected Outcome Short: continue to get comfortable with reading food labels. Long: maintain heart healthy diet long  term to help control cardiac risk factors.          Nutrition Goals Discharge (Final Nutrition Goals Re-Evaluation):  Nutrition Goals Re-Evaluation - 03/09/24 0956       Goals   Comment Sara Hodges reports that she is reading food labels and being mindful of what she is eating. She mostly is trying to eat lean protein and fresh vegetables to make sure she has  balanced meals. She is also making an effort to drink more water.    Expected Outcome Short: continue to get comfortable with reading food labels. Long: maintain heart healthy diet long term to help control cardiac risk factors.          Psychosocial: Target Goals: Acknowledge presence or absence of significant depression and/or stress, maximize coping skills, provide positive support system. Participant is able to verbalize types and ability to use techniques and skills needed for reducing stress and depression.   Education: Stress, Anxiety, and Depression - Group verbal and visual presentation to define topics covered.  Reviews how body is impacted by stress, anxiety, and depression.  Also discusses healthy ways to reduce stress and to treat/manage anxiety and depression. Written material provided at class time. Flowsheet Row Cardiac Rehab from 03/23/2024 in [facility] Cardiac and Pulmonary Rehab  Date 01/27/24  Educator [facility]  Instruction Review Code 1- [employer] Understanding    Education: Sleep Hygiene -Provides group verbal and written instruction about how sleep can affect your health.  Define sleep hygiene, discuss sleep cycles and impact of sleep habits. Review good sleep hygiene tips.   Initial Review & Psychosocial Screening:  Initial Psych Review & Screening - 12/28/23 1617       Initial Review   Current issues with None Identified      Family Dynamics   Good Support System? Yes    Comments Elyse can look to her friends, significant other and daughter for support. She states no depression or anxiety and does not take any medications for her mood.      Barriers   Psychosocial barriers to participate in program There are no identifiable barriers or psychosocial needs.;The patient should benefit from training in stress management and relaxation.      Screening Interventions   Interventions Encouraged to exercise;Provide feedback about the scores to participant;To provide support  and resources with identified psychosocial needs    Expected Outcomes Short Term goal: Utilizing psychosocial counselor, staff and physician to assist with identification of specific Stressors or current issues interfering with healing process. Setting desired goal for each stressor or current issue identified.;Long Term Goal: Stressors or current issues are controlled or eliminated.;Short Term goal: Identification and review with participant of any Quality of Life or Depression concerns found by scoring the questionnaire.;Long Term goal: The participant improves quality of Life and PHQ9 Scores as seen by post scores and/or verbalization of changes          Quality of Life Scores:   Quality of Life - 01/13/24 1045       Quality of Life   Select Quality of Life      Quality of Life Scores   Health/Function Pre 25.86 %    Socioeconomic Pre 22.56 %    Psych/Spiritual Pre 25.75 %    Family Pre 24.9 %    GLOBAL Pre 24.89 %         Scores of 19 and below usually indicate a poorer quality of life in these areas.  A difference of  2-3 points  is a clinically meaningful difference.  A difference of 2-3 points in the total score of the Quality of Life Index has been associated with significant improvement in overall quality of life, self-image, physical symptoms, and general health in studies assessing change in quality of life.  PHQ-9: Review Flowsheet       01/04/2024 04/22/2016  Depression screen PHQ 2/9  Decreased Interest 0 0  Down, Depressed, Hopeless 0 0  PHQ - 2 Score 0 0  Altered sleeping 1 0  Tired, decreased energy 0 1  Change in appetite 0 1  Feeling bad or failure about yourself  0 0  Trouble concentrating 0 0  Moving slowly or fidgety/restless 0 0  Suicidal thoughts 0 0   PHQ-9 Score 1 2  Difficult doing work/chores Not difficult at all Not difficult at all    Details       Data saved with a previous flowsheet row definition        Interpretation of Total Score   Total Score Depression Severity:  1-4 = Minimal depression, 5-9 = Mild depression, 10-14 = Moderate depression, 15-19 = Moderately severe depression, 20-27 = Severe depression   Psychosocial Evaluation and Intervention:  Psychosocial Evaluation - 12/28/23 1618       Psychosocial Evaluation & Interventions   Interventions Encouraged to exercise with the program and follow exercise prescription;Relaxation education;Stress management education    Comments Adelynne can look to her friends, significant other and daughter for support. She states no depression or anxiety and does not take any medications for her mood.    Expected Outcomes Short: Start [facility] to help with mood. Long: Maintain a healthy mental state    Continue Psychosocial Services  Follow up required by staff          Psychosocial Re-Evaluation:  Psychosocial Re-Evaluation     Row Name 03/09/24 1000             Psychosocial Re-Evaluation   Current issues with None Identified       Comments Sara Hodges reports that she has no concerns with stress or mental health effecting her ability to function in life. She does report that she does not sleep well all the time. She has a good support system and healthy ways of managing stress.       Expected Outcomes Short: continue to attend cardiac rehab for mental health benefits of exercise. Long: maintain good mental health routine.       Interventions Encouraged to attend Cardiac Rehabilitation for the exercise       Continue Psychosocial Services  Follow up required by staff          Psychosocial Discharge (Final Psychosocial Re-Evaluation):  Psychosocial Re-Evaluation - 03/09/24 1000       Psychosocial Re-Evaluation   Current issues with None Identified    Comments Sara Hodges reports that she has no concerns with stress or mental health effecting her ability to function in life. She does report that she does not sleep well all the time. She has a good support system and healthy  ways of managing stress.    Expected Outcomes Short: continue to attend cardiac rehab for mental health benefits of exercise. Long: maintain good mental health routine.    Interventions Encouraged to attend Cardiac Rehabilitation for the exercise    Continue Psychosocial Services  Follow up required by staff          Vocational Rehabilitation: Provide vocational rehab assistance to qualifying candidates.   Vocational Rehab  Evaluation & Intervention:   Education: Education Goals: Education classes will be provided on a variety of topics geared toward better understanding of heart health and risk factor modification. Participant will state understanding/return demonstration of topics presented as noted by education test scores.  Learning Barriers/Preferences:  Learning Barriers/Preferences - 12/28/23 1614       Learning Barriers/Preferences   Learning Barriers None    Learning Preferences Group Instruction;Individual Instruction;Pictoral;Skilled Demonstration;Verbal Instruction;Video;Written Material          General Cardiac Education Topics:  AED/CPR: - Group verbal and written instruction with the use of models to demonstrate the basic use of the AED with the basic ABC's of resuscitation.   Test and Procedures: - Group verbal and visual presentation and models provide information about basic cardiac anatomy and function. Reviews the testing methods done to diagnose heart disease and the outcomes of the test results. Describes the treatment choices: Medical Management, Angioplasty, or Coronary Bypass Surgery for treating various heart conditions including Myocardial Infarction, Angina, Valve Disease, and Cardiac Arrhythmias. Written material provided at class time. Flowsheet Row Cardiac Rehab from 03/23/2024 in [facility] Cardiac and Pulmonary Rehab  Date 03/09/24  Educator SB  Instruction Review Code 1- Verbalizes Understanding    Medication Safety: - Group verbal and visual  instruction to review commonly prescribed medications for heart and lung disease. Reviews the medication, class of the drug, and side effects. Includes the steps to properly store meds and maintain the prescription regimen. Written material provided at class time.   Intimacy: - Group verbal instruction through game format to discuss how heart and lung disease can affect sexual intimacy. Written material provided at class time.   Know Your Numbers and Heart Failure: - Group verbal and visual instruction to discuss disease risk factors for cardiac and pulmonary disease and treatment options.  Reviews associated critical values for Overweight/Obesity, Hypertension, Cholesterol, and Diabetes.  Discusses basics of heart failure: signs/symptoms and treatments.  Introduces Heart Failure Zone chart for action plan for heart failure. Written material provided at class time. Flowsheet Row Cardiac Rehab from 03/23/2024 in [facility] Cardiac and Pulmonary Rehab  Date 01/13/24  Educator [facility]  Instruction Review Code 1- Verbalizes Understanding    Infection Prevention: - Provides verbal and written material to individual with discussion of infection control including proper hand washing and proper equipment cleaning during exercise session. Flowsheet Row Cardiac Rehab from 03/23/2024 in [facility] Cardiac and Pulmonary Rehab  Date 01/04/24  Educator MB  Instruction Review Code 1- Verbalizes Understanding    Falls Prevention: - Provides verbal and written material to individual with discussion of falls prevention and safety. Flowsheet Row Cardiac Rehab from 03/23/2024 in [facility] Cardiac and Pulmonary Rehab  Date 01/04/24  Educator MB  Instruction Review Code 1- Verbalizes Understanding    Other: -Provides group and verbal instruction on various topics (see comments)   Knowledge Questionnaire Score:  Knowledge Questionnaire Score - 01/13/24 1045       Knowledge Questionnaire Score   Pre Score 25/26           Core Components/Risk Factors/Patient Goals at Admission:  Personal Goals and Risk Factors at Admission - 01/04/24 1359       Core Components/Risk Factors/Patient Goals on Admission    Weight Management Yes;Weight Loss    Intervention Weight Management: Develop a combined nutrition and exercise program designed to reach desired caloric intake, while maintaining appropriate intake of nutrient and fiber, sodium and fats, and appropriate energy expenditure required for the weight goal.;Weight  Management: Provide education and appropriate resources to help participant work on and attain dietary goals.;Weight Management/Obesity: Establish reasonable short term and long term weight goals.    Admit Weight 171 lb 6.4 oz (77.7 kg)    Goal Weight: Short Term 158 lb 3.2 oz (71.8 kg)    Goal Weight: Long Term 145 lb (65.8 kg)    Expected Outcomes Short Term: Continue to assess and modify interventions until short term weight is achieved;Long Term: Adherence to nutrition and physical activity/exercise program aimed toward attainment of established weight goal;Understanding recommendations for meals to include 15-35% energy as protein, 25-35% energy from fat, 35-60% energy from carbohydrates, less than 200mg  of dietary cholesterol, 20-35 gm of total fiber daily;Understanding of distribution of calorie intake throughout the day with the consumption of 4-5 meals/snacks;Weight Loss: Understanding of general recommendations for a balanced deficit meal plan, which promotes 1-2 lb weight loss per week and includes a negative energy balance of [phone] kcal/d    Heart Failure Yes    Intervention Provide a combined exercise and nutrition program that is supplemented with education, support and counseling about heart failure. Directed toward relieving symptoms such as shortness of breath, decreased exercise tolerance, and extremity edema.    Expected Outcomes Improve functional capacity of life;Short term: Attendance in  program 2-3 days a week with increased exercise capacity. Reported lower sodium intake. Reported increased fruit and vegetable intake. Reports medication compliance.;Short term: Daily weights obtained and reported for increase. Utilizing diuretic protocols set by physician.;Long term: Adoption of self-care skills and reduction of barriers for early signs and symptoms recognition and intervention leading to self-care maintenance.    Hypertension Yes    Intervention Provide education on lifestyle modifcations including regular physical activity/exercise, weight management, moderate sodium restriction and increased consumption of fresh fruit, vegetables, and low fat dairy, alcohol moderation, and smoking cessation.;Monitor prescription use compliance.    Expected Outcomes Short Term: Continued assessment and intervention until BP is < 140/3mm HG in hypertensive participants. < 130/66mm HG in hypertensive participants with diabetes, heart failure or chronic kidney disease.;Long Term: Maintenance of blood pressure at goal levels.    Lipids Yes    Intervention Provide education and support for participant on nutrition & aerobic/resistive exercise along with prescribed medications to achieve LDL 70mg , HDL >40mg .    Expected Outcomes Short Term: Participant states understanding of desired cholesterol values and is compliant with medications prescribed. Participant is following exercise prescription and nutrition guidelines.;Long Term: Cholesterol controlled with medications as prescribed, with individualized exercise RX and with personalized nutrition plan. Value goals: LDL < 70mg , HDL > 40 mg.          Education:Diabetes - Individual verbal and written instruction to review signs/symptoms of diabetes, desired ranges of glucose level fasting, after meals and with exercise. Acknowledge that pre and post exercise glucose checks will be done for 3 sessions at entry of program.   Core Components/Risk  Factors/Patient Goals Review:   Goals and Risk Factor Review     Row Name 03/09/24 0958             Core Components/Risk Factors/Patient Goals Review   Personal Goals Review Weight Management/Obesity;Heart Failure;Lipids       Review Sara Hodges stated that her weight has been steady. She still wants to work towards her weight loss goal, but the importance of weighing daily and monitoring for symptoms of heart failure was reviewed. She continues to take all her cholesterol medication as well and follows up with her doctor  for regular appointments and lab work.       Expected Outcomes Short: weight daily and monitor for signs of heart failure. Long: continue to control cardiac risk factors.          Core Components/Risk Factors/Patient Goals at Discharge (Final Review):   Goals and Risk Factor Review - 03/09/24 0958       Core Components/Risk Factors/Patient Goals Review   Personal Goals Review Weight Management/Obesity;Heart Failure;Lipids    Review Sara Hodges stated that her weight has been steady. She still wants to work towards her weight loss goal, but the importance of weighing daily and monitoring for symptoms of heart failure was reviewed. She continues to take all her cholesterol medication as well and follows up with her doctor for regular appointments and lab work.    Expected Outcomes Short: weight daily and monitor for signs of heart failure. Long: continue to control cardiac risk factors.          ITP Comments:  ITP Comments     Row Name 12/28/23 1620 01/04/24 1350 01/06/24 1045 01/27/24 1317 02/24/24 1111   ITP Comments Virtual Visit completed. Patient informed on EP and RD appointment and 6 Minute walk test. Patient also informed of patient health questionnaires on My Chart. Patient Verbalizes understanding. Visit diagnosis can be found in [facility] 11/20/2023. Completed and gym orientation for cardiac rehab. Initial ITP created and sent for review to Dr. Oneil Pinal, Medical  Director. First full day of exercise!  Patient was oriented to gym and equipment including functions, settings, policies, and procedures.  Patient's individual exercise prescription and treatment plan were reviewed.  All starting workloads were established based on the results of the 6 minute walk test done at initial orientation visit.  The plan for exercise progression was also introduced and progression will be customized based on patient's performance and goals. 30 Day review completed. Medical Director ITP review done, changes made as directed, and signed approval by Medical Director. New to program. 30 Day review completed. Medical Director ITP review done, changes made as directed, and signed approval by Medical Director.    Row Name 03/23/24 1120 04/20/24 1017 05/18/24 1053       ITP Comments 30 Day review completed. Medical Director ITP review done, changes made as directed, and signed approval by Medical Director. 30 Day review completed. Medical Director ITP review done, changes made as directed, and signed approval by Medical Director. 30 Day review completed. Medical Director ITP review done, changes made as directed, and signed approval by Medical Director.        Comments: 30 day review

## 2024-05-19 ENCOUNTER — Encounter

## 2024-05-25 ENCOUNTER — Encounter: Attending: Cardiovascular Disease | Admitting: Emergency Medicine

## 2024-05-25 DIAGNOSIS — Z955 Presence of coronary angioplasty implant and graft: Secondary | ICD-10-CM | POA: Insufficient documentation

## 2024-05-25 NOTE — Progress Notes (Signed)
 Daily Session Note  Patient Details  Name: Sara Hodges MRN: [id number] Date of Birth: February 23, 1951 Referring Provider:   Flowsheet Row Cardiac Rehab from 01/04/2024 in [facility] Cardiac and Pulmonary Rehab  Referring Provider Clarinda Sharper MD    Encounter Date: 05/25/2024  Check In:  Session Check In - 05/25/24 0946       Check-In   Supervising physician immediately available to respond to emergencies See telemetry face sheet for immediately available ER MD    Location [facility]-Cardiac & Pulmonary Rehab    Staff Present Burnard Hint BS, ACSM CEP, Exercise Physiologist;Joseph Rolinda RCP,RRT,BSRT;Darina Hartwell RN,BSN;Meredith Tressa RN,BSN    Virtual Visit No    Medication changes reported     No    Fall or balance concerns reported    No    Warm-up and Cool-down Performed on first and last piece of equipment    Resistance Training Performed Yes    VAD Patient? No    PAD/SET Patient? No      Pain Assessment   Currently in Pain? No/denies             Social History   Tobacco Use  Smoking Status Never  Smokeless Tobacco Never    Goals Met:  Independence with exercise equipment Exercise tolerated well No report of concerns or symptoms today Strength training completed today  Goals Unmet:  Not Applicable  Comments: Pt able to follow exercise prescription today without complaint.  Will continue to monitor for progression.    Dr. Oneil Pinal is Medical Director for [facility] Cardiac Rehabilitation.  Dr. Fuad Aleskerov is Medical Director for [facility] Pulmonary Rehabilitation.

## 2024-05-26 ENCOUNTER — Encounter: Admitting: Emergency Medicine

## 2024-05-26 DIAGNOSIS — Z955 Presence of coronary angioplasty implant and graft: Secondary | ICD-10-CM

## 2024-05-26 NOTE — Progress Notes (Signed)
 Daily Session Note  Patient Details  Name: Sara Hodges MRN: [id number] Date of Birth: 1951-08-30 Referring Provider:   Flowsheet Row Cardiac Rehab from 01/04/2024 in [facility] Cardiac and Pulmonary Rehab  Referring Provider Clarinda Sharper MD    Encounter Date: 05/26/2024  Check In:  Session Check In - 05/26/24 1735       Check-In   Supervising physician immediately available to respond to emergencies See telemetry face sheet for immediately available ER MD    Location [facility]-Cardiac & Pulmonary Rehab    Staff Present Rollene Paterson, MS, Exercise Physiologist;Krystl Wickware RN,BSN;Joseph Rolinda RCP,RRT,BSRT    Virtual Visit No    Medication changes reported     No    Fall or balance concerns reported    No    Warm-up and Cool-down Performed on first and last piece of equipment    Resistance Training Performed Yes    VAD Patient? No    PAD/SET Patient? No      Pain Assessment   Currently in Pain? No/denies             Social History   Tobacco Use  Smoking Status Never  Smokeless Tobacco Never    Goals Met:  Independence with exercise equipment Exercise tolerated well No report of concerns or symptoms today Strength training completed today  Goals Unmet:  Not Applicable  Comments: Pt able to follow exercise prescription today without complaint.  Will continue to monitor for progression.    Dr. Oneil Pinal is Medical Director for [facility] Cardiac Rehabilitation.  Dr. Fuad Aleskerov is Medical Director for [facility] Pulmonary Rehabilitation.

## 2024-05-27 ENCOUNTER — Other Ambulatory Visit: Payer: Self-pay

## 2024-05-27 ENCOUNTER — Encounter
Admission: RE | Admit: 2024-05-27 | Discharge: 2024-05-27 | Disposition: A | Discharge status: Home / Self Care | Source: Ambulatory Visit | Attending: Surgery | Admitting: Surgery

## 2024-05-27 HISTORY — DX: Headache, unspecified: R51.9

## 2024-05-27 HISTORY — DX: Unspecified osteoarthritis, unspecified site: M19.90

## 2024-05-27 HISTORY — DX: Presence of cardiac pacemaker: Z95.0

## 2024-05-27 HISTORY — DX: Pneumonia, unspecified organism: J18.9

## 2024-05-27 HISTORY — DX: Anemia, unspecified: D64.9

## 2024-05-27 NOTE — Patient Instructions (Addendum)
 Your procedure is scheduled on: 06/03/24 - Friday Report to the Registration Desk on the 1st floor of the [facility]. To find out your arrival time, please call [phone] between 1PM - 3PM on: 06/02/24 - Thursday If your arrival time is 6:00 am, do not arrive before that time as the [facility] entrance doors do not open until 6:00 am.  REMEMBER: Instructions that are not followed completely may result in serious medical risk, up to and including death; or upon the discretion of your surgeon and anesthesiologist your surgery may need to be rescheduled.  Do not eat food after midnight the night before surgery.  No gum chewing or hard candies.  You may however, drink CLEAR liquids up to 2 hours before you are scheduled to arrive for your surgery. Do not drink anything within 2 hours of your scheduled arrival time.  Clear liquids include: - water  - apple juice without pulp - gatorade (not RED colors) - black coffee or tea (Do NOT add milk or creamers to the coffee or tea) Do NOT drink anything that is not on this list.  One week prior to surgery: Stop Anti-inflammatories (NSAIDS) such as Advil, Aleve, Ibuprofen, Motrin, Naproxen, Naprosyn and Aspirin  based products such as Excedrin, Goody's Powder, BC Powder. You may continue to take Tylenol  if needed for pain up until the day of surgery.  Stop ANY OVER THE COUNTER supplements until after surgery : Folic Acid   clopidogrel (PLAVIX) - stop beginning , will be made aware.  aspirin  EC - continue taking.   ON THE DAY OF SURGERY ONLY TAKE THESE MEDICATIONS WITH SIPS OF WATER:  metoprolol succinate  pantoprazole (PROTONIX)    No Alcohol for 24 hours before or after surgery.  No Smoking including e-cigarettes for 24 hours before surgery.  No chewable tobacco products for at least 6 hours before surgery.  No nicotine patches on the day of surgery.  Do not use any recreational drugs for at least a week (preferably 2 weeks)  before your surgery.  Please be advised that the combination of cocaine and anesthesia may have negative outcomes, up to and including death. If you test positive for cocaine, your surgery will be cancelled.  On the morning of surgery brush your teeth with toothpaste and water, you may rinse your mouth with mouthwash if you wish. Do not swallow any toothpaste or mouthwash.  Use CHG Soap or wipes as directed on instruction sheet.  Do not wear jewelry, make-up, hairpins, clips or nail polish.  For welded (permanent) jewelry: bracelets, anklets, waist bands, etc.  Please have this removed prior to surgery.  If it is not removed, there is a chance that hospital personnel will need to cut it off on the day of surgery.  Do not wear lotions, powders, or perfumes.   Do not shave body hair from the neck down 48 hours before surgery.  Contact lenses, hearing aids and dentures may not be worn into surgery.  Do not bring valuables to the hospital. [facility] is not responsible for any missing/lost belongings or valuables.   Notify your doctor if there is any change in your medical condition (cold, fever, infection).  Wear comfortable clothing (specific to your surgery type) to the hospital.  After surgery, you can help prevent lung complications by doing breathing exercises.  Take deep breaths and cough every 1-2 hours. Your doctor may order a device called an Incentive Spirometer to help you take deep breaths.  When coughing or  sneezing, hold a pillow firmly against your incision with both hands. This is called "splinting." Doing this helps protect your incision. It also decreases belly discomfort.  If you are being admitted to the hospital overnight, leave your suitcase in the car. After surgery it may be brought to your room.  In case of increased patient census, it may be necessary for you, the patient, to continue your postoperative care in the Same Day Surgery department.  If you are  being discharged the day of surgery, you will not be allowed to drive home. You will need a responsible individual to drive you home and stay with you for 24 hours after surgery.   If you are taking public transportation, you will need to have a responsible individual with you.  Please call the Pre-admissions Testing Dept. at [phone] if you have any questions about these instructions.  Surgery Visitation Policy:  Patients having surgery or a procedure may have two visitors.  Children under the age of 47 must have an adult with them who is not the patient.  Inpatient Visitation:    Visiting hours are 7 a.m. to 8 p.m. Up to four visitors are allowed at one time in a patient room. The visitors may rotate out with other people during the day.  One visitor age 93 or older may stay with the patient overnight and must be in the room by 8 p.m.   Merchandiser, retail to address health-related social needs:  [email or web address]                                                                                                             Preparing for Surgery with CHLORHEXIDINE GLUCONATE (CHG) Soap  Chlorhexidine Gluconate (CHG) Soap  o An antiseptic cleaner that kills germs and bonds with the skin to continue killing germs even after washing  o Used for showering the night before surgery and morning of surgery  Before surgery, you can play an important role by reducing the number of germs on your skin.  CHG (Chlorhexidine gluconate) soap is an antiseptic cleanser which kills germs and bonds with the skin to continue killing germs even after washing.  Please do not use if you have an allergy to CHG or antibacterial soaps. If your skin becomes reddened/irritated stop using the CHG.  1. Shower the NIGHT BEFORE SURGERY and the MORNING OF SURGERY with CHG soap.  2. If you choose to wash your hair, wash your hair first as usual with your normal shampoo.  3. After  shampooing, rinse your hair and body thoroughly to remove the shampoo.  4. Use CHG as you would any other liquid soap. You can apply CHG directly to the skin and wash gently with a scrungie or a clean washcloth.  5. Apply the CHG soap to your body only from the neck down. Do not use on open wounds or open sores. Avoid contact with your eyes, ears, mouth, and genitals (private parts). Wash face and genitals (private parts) with your normal soap.  6.  Wash thoroughly, paying special attention to the area where your surgery will be performed.  7. Thoroughly rinse your body with warm water.  8. Do not shower/wash with your normal soap after using and rinsing off the CHG soap.  9. Pat yourself dry with a clean towel.  10. Wear clean pajamas to bed the night before surgery.  12. Place clean sheets on your bed the night of your first shower and do not sleep with pets.  13. Shower again with the CHG soap on the day of surgery prior to arriving at the hospital.  14. Do not apply any deodorants/lotions/powders.  15. Please wear clean clothes to the hospital.

## 2024-05-30 ENCOUNTER — Encounter
Admission: RE | Admit: 2024-05-30 | Discharge: 2024-05-30 | Disposition: A | Discharge status: Home / Self Care | Source: Ambulatory Visit | Attending: Surgery | Admitting: Surgery

## 2024-05-30 DIAGNOSIS — I10 Essential (primary) hypertension: Secondary | ICD-10-CM | POA: Diagnosis not present

## 2024-05-30 DIAGNOSIS — Z01810 Encounter for preprocedural cardiovascular examination: Secondary | ICD-10-CM | POA: Insufficient documentation

## 2024-05-30 DIAGNOSIS — I2510 Atherosclerotic heart disease of native coronary artery without angina pectoris: Secondary | ICD-10-CM | POA: Insufficient documentation

## 2024-05-30 DIAGNOSIS — N649 Disorder of breast, unspecified: Secondary | ICD-10-CM

## 2024-06-01 ENCOUNTER — Encounter

## 2024-06-01 DIAGNOSIS — Z955 Presence of coronary angioplasty implant and graft: Secondary | ICD-10-CM

## 2024-06-01 NOTE — Progress Notes (Signed)
 Daily Session Note  Patient Details  Name: Sara Hodges MRN: [id number] Date of Birth: 03/05/1951 Referring Provider:   Flowsheet Row Cardiac Rehab from 01/04/2024 in [facility] Cardiac and Pulmonary Rehab  Referring Provider Clarinda Sharper MD    Encounter Date: 06/01/2024  Check In:  Session Check In - 06/01/24 1720       Check-In   Supervising physician immediately available to respond to emergencies See telemetry face sheet for immediately available ER MD    Location [facility]-Cardiac & Pulmonary Rehab    Staff Present Burnard Davenport RN,BSN,MPA;[facility] Dyane BS, ACSM CEP, Exercise Physiologist    Virtual Visit No    Medication changes reported     No    Fall or balance concerns reported    No    Warm-up and Cool-down Performed on first and last piece of equipment    Resistance Training Performed Yes    VAD Patient? No    PAD/SET Patient? No      Pain Assessment   Currently in Pain? No/denies             Social History   Tobacco Use  Smoking Status Never  Smokeless Tobacco Never    Goals Met:  Independence with exercise equipment Exercise tolerated well No report of concerns or symptoms today Strength training completed today  Goals Unmet:  Not Applicable  Comments: Pt able to follow exercise prescription today without complaint.  Will continue to monitor for progression.    Dr. Oneil Pinal is Medical Director for [facility] Cardiac Rehabilitation.  Dr. Fuad Aleskerov is Medical Director for [facility] Pulmonary Rehabilitation.

## 2024-06-02 ENCOUNTER — Encounter

## 2024-06-02 ENCOUNTER — Encounter: Payer: Self-pay | Admitting: Surgery

## 2024-06-02 MED ORDER — LACTATED RINGERS IV SOLN
INTRAVENOUS | Status: DC
Start: 2024-06-02 — End: 2024-06-03

## 2024-06-02 MED ORDER — CHLORHEXIDINE GLUCONATE CLOTH 2 % EX PADS
6.0000 | MEDICATED_PAD | Freq: Once | CUTANEOUS | Status: AC
  Administered 2024-06-03: 6 via TOPICAL

## 2024-06-02 MED ORDER — CHLORHEXIDINE GLUCONATE 0.12 % MT SOLN
15.0000 mL | Freq: Once | OROMUCOSAL | Status: AC
  Administered 2024-06-03: 15 mL via OROMUCOSAL

## 2024-06-02 MED ORDER — CEFAZOLIN SODIUM-DEXTROSE 2-4 GM/100ML-% IV SOLN
2.0000 g | INTRAVENOUS | Status: AC
  Administered 2024-06-03: 2 g via INTRAVENOUS

## 2024-06-02 MED ORDER — ORAL CARE MOUTH RINSE
15.0000 mL | Freq: Once | OROMUCOSAL | Status: AC
Start: 2024-06-02 — End: 2024-06-03

## 2024-06-02 NOTE — Progress Notes (Signed)
 Perioperative / Anesthesia Services  Pre-Admission Testing Clinical Review / Pre-Operative Anesthesia Consult  Date: 06/02/24  PATIENT DEMOGRAPHICS: Name: Sara Hodges DOB: [birth year] MRN:   [id number]  Note: Available PAT nursing documentation and vital signs have been reviewed. Clinical nursing staff has updated patient's PMH/PSHx, current medication list, and drug allergies/intolerances to ensure complete and comprehensive history available to assist care teams in MDM as it pertains to the aforementioned surgical procedure and anticipated anesthetic course. Extensive review of available clinical information personally performed. [place] PMH and PSHx updated with any diagnoses/procedures that  may have been inadvertently omitted during her intake with the pre-admission testing department's nursing staff.  PLANNED SURGICAL PROCEDURE(S):   Case: [id number] Date/Time: 06/03/24 0715   Procedure: EXCISION, LESION, BREAST (Left: Breast)   Anesthesia type: General   Pre-op diagnosis: N61.1 Breast abscess   Location: [facility] OR ROOM 04 / [facility] ORS FOR ANESTHESIA GROUP   Surgeons: Tye Millet, DO        CLINICAL DISCUSSION: FABIANNA Hodges is a 73 y.o. female who is submitted for pre-surgical anesthesia review and clearance prior to her undergoing the above procedure. Pertinent PMH includes: hypertensive cardiomyopathy, HFrEF, CVA, PAF, Mobitz 1 second-degree AV block, NSVT, atypical angina, LBBB, aortic atherosclerosis, HTN, HLD, GERD (on daily PPI), IPMN (s/p pancreaticoduodenectomy), anemia, LEFT breast cyst/abscess, OA, RA, thoracic spondylosis.  Patient is followed by cardiology [name], MD). She was last seen in the cardiology clinic on 04/12/2024; notes reviewed. At the time of her clinic visit, patient doing well overall from a cardiovascular perspective. Patient denied any chest pain, shortness of breath, PND, orthopnea, palpitations, significant peripheral edema, weakness, fatigue,  vertiginous symptoms, or presyncope/syncope. Patient with a past medical history significant for cardiovascular diagnoses. Documented physical exam was grossly benign, providing no evidence of acute exacerbation and/or decompensation of the patient's known cardiovascular conditions.  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  Patient unaware of this diagnosis.  She has no residual deficits following neurological event.  Patient with a history of symptomatic bradycardia.  She was found to be in Mobitz type I second-degree AV block.  Patient ultimately underwent placement of a dual-chamber [employer] Accolade [phone] implanted pacemaker on 09/09/2019.  Patient is not pacemaker dependent per electrophysiology. Device is regularly interrogated by patient's primary electrophysiology team.  Most recent interrogation showed normal functioning.   Coronary CTA was performed on 12/05/2021 that demonstrated an Agatston coronary artery calcium score of 948. This placed patient in the 99th percentile for age, sex, and race matched controls. Calcium depositions noted to be isolated mainly in the mid LAD (60%) and distal LAD (50%) distributions.  Study demonstrated normal coronary origin with RIGHT dominance.  Study was sent for CT FFR analysis (ranges: < 0.75 high likelihood of hemodynamically significant stenosis, 0.76-0.80 borderline, > 0.80 normal):  LAD: 0.890 proximal aspect of the distal LAD, 0.75 beyond the distal LAD stenosis, 0.64 within the most distal aspect of the LAD   LCx: 0.87 distal    RCA: 0.95 proximal, 0.9 to distal   TTE performed on 09/30/2022 revealed a normal left ventricular systolic function with an EF of >55%.  There is mild LVH.  There were no regional wall motion abnormalities.  Left ventricular diastolic Doppler parameters were normal.  Left ventricular GLS -15.5%.   Right ventricular size and function normal with a TAPSE of 2.3 cm (normal range >/= 1.6  cm).  There was trivial mitral and mild tricuspid valve regurgitation.  RVSP = 27.0 mmHg.  All transvalvular gradients were noted to be normal providing no evidence suggestive of valvular stenosis.  Aorta normal in size with no evidence of ectasia or aneurysmal dilatation.  Myocardial perfusion imaging study was performed on 11/10/2023 revealing a normal left ventricular systolic function with EF of 57%.  There was hypokinesis of the apex noted.  A moderate-sized perfusion abnormality of moderate to severe intensity present in the apex extending into the apical septal and inferoapical walls.  Other walls perfuse normally.  Findings consistent with ischemia.  TID ratio = 1.00 (normal range </= 1.0-1.2).  Further evaluation was recommended.  Patient underwent diagnostic LEFT heart catheterization on 11/20/2023.  Study revealed 90% occlusion of the mid LAD.  PCI was performed placing a 3.0 x 22 mm Frontier Onyx DES x 1.  Procedure yielded excellent angiographic result and TIMI-3 flow.  Patient with a history of paroxysmal atrial fibrillation diagnosis; CHA2DS2-VASc Score = 7 (age, sex, HFpEF, HTN, CVA x 2, vascular disease). Cardiac rate and rhythm currently being maintained on beta-blocker therapy.  Patient is not on chronic oral anticoagulation therapy.  HFpEF being managed on GDMT including beta-blocker (metoprolol succinate) and ARB/ARNI (Entresto ) therapies; blood pressure well controlled at 114/61 mmHg. Patient has a supply of short acting nitrates (NTG) to use on an as needed basis for recurrent angina/anginal equivalent symptoms; denied recent use.  Patient is statin intolerant, and is therefore on a PCSK9i (evolocumab) for her HLD diagnosis and further ASCVD prevention.  Patient is not diabetic.  She does not have an OSAH diagnosis.  Patient is active per baseline. Patient is able to complete all of her  ADL/IADLs  without cardiovascular limitation.  Per the DASI, patient is able to achieve at least 4 METS of physical activity without experiencing any significant degree of angina/anginal equivalent symptoms.  No changes were made to her medication regimen.  Patient follow-up with outpatient cardiology/electrophysiology in 4 months or sooner if needed.  Lailyn Appelbaum Smyers is scheduled for an elective EXCISION, LESION, BREAST (Left: Breast) on 06/03/2024 with Dr. Henriette Pierre, DO. Given patient's past medical history significant for cardiovascular diagnoses, presurgical cardiac clearance was sought by the PAT team. Per cardiology, this patient is optimized for surgery and may proceed with the planned procedural course with a MODERATE risk of significant perioperative cardiovascular complications.  Again, this patient is on daily DAPT therapy.  He has been instructed on recommendations for holding her clopidogrel for 5 days prior to her procedure with plans to restart her symptoms postoperatively respectively minimized by her primary attending surgeon.  Patient is aware that her last dose of clopidogrel should be on 05/28/2024.  Patient denies previous perioperative complications with anesthesia in the past. In review her EMR, it is noted that patient underwent a MAC anesthetic course at [facility] ([facility]) in 09/2023 without documented complications.   MOST RECENT VITAL SIGNS:    01/04/2024    1:57 PM 08/10/2017   11:27 AM 08/04/2017   10:58 AM  Vitals with BMI  Height 5' 5.39 5' 6 5' 6  Weight 171 lbs 6 oz 201 lbs 202 lbs 13 oz  BMI 28.18 32.46 32.75  Systolic  136 146  Diastolic  78 72  Pulse  75 85   PROVIDERS/SPECIALISTS: NOTE: Primary physician provider listed below. Patient may have been seen by APP or partner within same practice.   PROVIDER ROLE / SPECIALTY  LAST SHERLEAN Tye Millet, DO General Surgery (Surgeon) 05/03/2024  Auston Reyes BIRCH, MD Primary Care Provider 04/20/2024   Clarinda Sharper, MD Cardiology 12/09/2023  Avanell Katz, MD Physiatry 05/16/2024   ALLERGIES: Allergies  Allergen Reactions   Beta Adrenergic Blockers Other (See Comments)    Second degree heart block   Amoxicillin-Pot Clavulanate Nausea Only and Other (See Comments)   Tramadol Nausea Only    CURRENT HOME MEDICATIONS: No current facility-administered medications for this encounter.    acetaminophen  (TYLENOL ) 500 MG tablet   aspirin  EC 81 MG tablet   clopidogrel (PLAVIX) 75 MG tablet   ENTRESTO  49-51 MG   Evolocumab (REPATHA SURECLICK) 140 MG/ML SOAJ   folic acid  (FOLVITE ) 1 MG tablet   furosemide  (LASIX ) 20 MG tablet   HYDROcodone-acetaminophen  (NORCO/VICODIN) 5-325 MG tablet   loratadine (CLARITIN) 10 MG tablet   metoprolol succinate (TOPROL-XL) 25 MG 24 hr tablet   nitroGLYCERIN (NITROSTAT) 0.4 MG SL tablet   pantoprazole (PROTONIX) 40 MG tablet   Vitamin D , Ergocalciferol , (DRISDOL ) 50000 UNITS CAPS capsule   HISTORY: Past Medical History:  Diagnosis Date   Anemia    Aortic atherosclerosis (HCC)    Arthritis    Atypical angina (HCC)    Bradycardia    Breast cyst, left    CAD (coronary artery disease)    Chronic fatigue    Dilated pancreatic duct    GERD (gastroesophageal reflux disease)    Headache    HFrEF (heart failure with reduced ejection fraction) (HCC)    HLD (hyperlipidemia)    Hypertension    Hypertensive cardiomyopathy, with heart failure (HCC)    IPMN (intraductal papillary mucinous neoplasm)    a.) single foci of high-grade dysplasia; s/p pancreaticoduodenectomy 12/2022   Long term current use of clopidogrel    Long-term use of aspirin  therapy    Mobitz type 1 second degree atrioventricular block    a.) s/p PPM 09/09/2019   NSVT (nonsustained ventricular tachycardia) (HCC)    Osteoporosis    PAF (paroxysmal atrial fibrillation) (HCC)    Pneumonia    Presence of permanent cardiac pacemaker 09/09/2019   a.) [employer] Accolade [phone]  dual chamber pacemaker   Statin myopathy    Thoracic spondylosis    Vitamin D  deficiency    Past Surgical History:  Procedure Laterality Date   ABDOMINAL HYSTERECTOMY  1985   BREAST BIOPSY Right 2002   negative   COLONOSCOPY     ESOPHAGOGASTRODUODENOSCOPY     EYE SURGERY Bilateral    lens placed   FOOT SURGERY  2016   HAND SURGERY     INCISION AND DRAINAGE Left 2010   breast    PACEMAKER IMPLANT Left 09/09/2019   PANCREATICODUODENECTOMY N/A 12/2022   Family History  Problem Relation Age of Onset   Lung cancer Mother    Breast cancer Neg Hx    Social History   Tobacco Use   Smoking status: Never   Smokeless tobacco: Never  Substance Use Topics   Alcohol use: No    Alcohol/week: 0.0 standard drinks of alcohol   LABS:  Component Ref Range & Units 04/20/2024  WBC (White Blood Cell Count) 4.1 - 10.2 10^3/uL 3.8 Low   RBC (Red Blood Cell Count) 4.04 - 5.48 10^6/uL 3.89 Low   Hemoglobin 12.0 - 15.0 gm/dL 87.0  Hematocrit 64.9 - 47.0 % 38.5  MCV (Mean Corpuscular Volume) 80.0 - 100.0 fl 99.0  MCH (Mean Corpuscular Hemoglobin) 27.0 - 31.2 pg 33.2 High   MCHC (  Mean Corpuscular Hemoglobin Concentration) 32.0 - 36.0 gm/dL 66.4  Platelet Count 849 - 450 10^3/uL 210  RDW-CV (Red Cell Distribution Width) 11.6 - 14.8 % 12.6  MPV (Mean Platelet Volume) 9.4 - 12.4 fl 10.5  Neutrophils 1.50 - 7.80 10^3/uL 2.65  Lymphocytes 1.00 - 3.60 10^3/uL 0.87 Low   Monocytes 0.00 - 1.50 10^3/uL 0.19  Eosinophils 0.00 - 0.55 10^3/uL 0.06  Basophils 0.00 - 0.09 10^3/uL 0.02  Neutrophil % 32.0 - 70.0 % 69.7  Lymphocyte % 10.0 - 50.0 % 22.9  Monocyte % 4.0 - 13.0 % 5.0  Eosinophil % 1.0 - 5.0 % 1.6  Basophil% 0.0 - 2.0 % 0.5  Immature Granulocyte % <=0.7 % 0.3  Immature Granulocyte Count <=0.06 10^3/L 0.01   Component Ref Range & Units 04/20/2024  Glucose 70 - 110 mg/dL 97  Sodium 863 - 854 mmol/L 141  Potassium 3.6 - 5.1 mmol/L 4.4  Chloride 97 - 109 mmol/L  105  Carbon Dioxide (CO2) 22.0 - 32.0 mmol/L 29.7  Urea Nitrogen (BUN) 7 - 25 mg/dL 18  Creatinine 0.6 - 1.1 mg/dL 1.0  Glomerular Filtration Rate (eGFR) >60 mL/min/1.73sq m 59 Low   Calcium 8.7 - 10.3 mg/dL 9.5  AST 8 - 39 U/L 20  ALT 5 - 38 U/L 20  Alk Phos (alkaline Phosphatase) 34 - 104 U/L 71  Albumin 3.5 - 4.8 g/dL 4.3  Bilirubin, Total 0.3 - 1.2 mg/dL 0.4  Protein, Total 6.1 - 7.9 g/dL 7.1  A/G Ratio 1.0 - 5.0 gm/dL 1.5    ECG: Date: 90/91/7974  Time ECG obtained: 1015 AM Rate: 62 bpm Rhythm: Sinus rhythm with first-degree AV block; LBBB Axis (leads I and aVF): left Intervals: PR 216 ms. QRS 132 ms. QTc 464 ms. ST segment and T wave changes: No evidence of acute T wave abnormalities or significant ST segment elevation or depression.  Evidence of a possible, age undetermined, prior infarct:  No Comparison: Previously obtained tracing on 11/02/2023 showed an atrial paced rhythm with an LBBB at a rate of 60 bpm.   IMAGING / PROCEDURES: CT THORACIC SPINE WO CONTRAST performed on 05/16/2024 T12 superior endplate vertebral compression fracture (with up to 55% vertebral body height loss), unchanged from the prior CT abdomen/pelvis of 11/02/2013. Thoracic spondylosis as described. No significant spinal canal stenosis. No appreciable compressive foraminal stenosis. Dextrocurvature of the thoracic spine. Aortic atherosclerosis  US  LIMITED ULTRASOUND INCLUDING AXILLA LEFT BREAST performed on 04/15/2024 Recommend clinical follow-up to resolution with follow-up with provider/surgeon for any additional antibiotics if clinically indicated. Recommend consideration of dermatology/surgical consultation for excision after resolution of acute inflammatory state. Should infection symptoms worsen, repeat ultrasound with potential aspiration could be considered. Bilateral annual screening mammogram, due October 2025  CT ABDOMEN PELVIS WITH CONTRAST performed on 02/09/2024 No  evidence of malignancy in the abdomen or pelvis.   VAS US  ABI WITH/WO TBI performed on 01/04/2024 Bilateral ankle to brachial indices were in normal range at rest, with triphasic waveforms recorded. Bilateral digit pressures were recorded, suggesting adequate healing potential.  Bilateral pulse volume recording waveforms were within normal parameters.   LEFT HEART CATHETERIZATION AND CORONARY ANGIOGRAPHY performed on 11/20/2023 Normal LVEDP (~ 10 mmHg.)  Coronary arteriography reveals a 90% mid-LAD stenosis, which corresponds to the scintigraphic findings.  The LCX and RCA are unchanged compared to the previous examination 19Dec2018.  Attention directed to the mid-LAD  lesion.  Vessel interrogated with [facility] Scientific IVUS for sizing.  3.0 x 22 mm Frontier Onyx drug-eluting stent  to 20 atm.  3.5 x 15 mm Ogema Emerge balloon to 20 atm.   MYOCARDIAL PERFUSION IMAGING STUDY (LEXISCAN) performed on 11/10/2023 Regional wall motion:  demonstrates  hypokinesis of the apex .  The overall quality of the study is good.   Artifacts noted: breast attenuation  Left ventricular cavity:  normal size .  Resting SPECT images demonstrate homogeneous tracer distribution throughout the myocardium.     TRANSTHORACIC ECHOCARDIOGRAM performed on 09/30/2022 Normal left ventricular systolic function with an EF of >55% No regional wall motion abnormalities Left ventricular diastolic Doppler parameters were normal Left ventricular GLS -15.5% Right ventricular size and function normal Trivial MR Mild TR RVSP = 27 mmHg Normal gradients; no valvular stenosis  CORONARY CTA WITH FFR CT performed on 12/05/2021 Normal cardiac functional analysis. Quantified LVEF of 72%.  LAD predominant moderate to severe calcifications with up to 60% luminal stenosis involving the mid LAD. There is a separate two approximately 50% stenosis of the distal LAD.  Severe coronary artery calcifications with total Agatston Calcium Score  of 948 corresponding to the 99th percentile for patient age and gender.  The study is submitted for CT-FFR analysis (ranges: < 0.75 high likelihood of hemodynamically significant stenosis, 0.76-0.80 borderline, > 0.80 normal): LAD: 0.890 proximal aspect of the distal LAD, 0.75 beyond the distal LAD stenosis, 0.64 within the most distal aspect of the LAD  LCx: 0.87 distal  RCA: 0.95 proximal, 0.9 to distal   IMPRESSION AND PLAN: WAYNETTA METHENY has been referred for pre-anesthesia review and clearance prior to her undergoing the planned anesthetic and procedural courses. Available labs, pertinent testing, and imaging results were personally reviewed by me in preparation for upcoming operative/procedural course. [facility] medical record has been updated following extensive record review and patient interview with PAT staff.   This patient has been appropriately cleared by cardiology with an overall MODERATE risk of patient experiencing significant perioperative cardiovascular complications. Electrophysiology indicating that procedure may interfere with planned surgical procedure and are therefore recommending magnet placement over the device during the procedure.  Magnet should be removed post procedurally, which in turn will allow for the device to revert to previously programmed settings without the need for reprogramming.  Beyond normal perioperative cardiovascular monitoring and the aforementioned magnet placement, there are no recommendations from her electrophysiology team that would prompt further discussions/recommendations from an industry representative.   Based on clinical review performed today (06/02/24), barring any significant acute changes in the patient's overall condition, it is anticipated that she will be able to proceed with the planned surgical intervention. Any acute changes in clinical condition may necessitate her procedure being postponed and/or cancelled. Patient will meet with  anesthesia team (MD and/or CRNA) on the day of her procedure for preoperative evaluation/assessment. Questions regarding anesthetic course will be fielded at that time.   Pre-surgical instructions were reviewed with the patient during his PAT appointment, and questions were fielded to satisfaction by PAT clinical staff. She has been instructed on which medications that she will need to hold prior to surgery, as well as the ones that have been deemed safe/appropriate to take on the day of her procedure. As part of the general education provided by PAT, patient made aware both verbally and in writing, that she would need to abstain from the use of any illegal substances during her perioperative course. She was advised that failure to follow the provided instructions could necessitate case cancellation or result in serious perioperative complications up to and including death.  Patient encouraged to contact PAT and/or her surgeon's office to discuss any questions or concerns that may arise prior to surgery; verbalized understanding.   Dorise Pereyra, MSN, APRN, FNP-C, CEN [facility]  Perioperative Services Nurse Practitioner Phone: [phone] Fax: [phone] 06/02/24 10:13 AM  NOTE: This note has been prepared using Dragon dictation software. Despite my best ability to proofread, there is always the potential that unintentional transcriptional errors may still occur from this process.

## 2024-06-03 ENCOUNTER — Ambulatory Visit: Payer: Self-pay | Admitting: Urgent Care

## 2024-06-03 ENCOUNTER — Other Ambulatory Visit: Payer: Self-pay | Admitting: Family

## 2024-06-03 ENCOUNTER — Other Ambulatory Visit: Payer: Self-pay

## 2024-06-03 ENCOUNTER — Ambulatory Visit
Admission: RE | Admit: 2024-06-03 | Discharge: 2024-06-03 | Disposition: A | Discharge status: Home / Self Care | Attending: Surgery | Admitting: Surgery

## 2024-06-03 ENCOUNTER — Encounter
Admission: RE | Disposition: A | Discharge status: Home / Self Care | Payer: Self-pay | Source: Home / Self Care | Attending: Surgery

## 2024-06-03 ENCOUNTER — Encounter: Payer: Self-pay | Admitting: Surgery

## 2024-06-03 DIAGNOSIS — I5040 Unspecified combined systolic (congestive) and diastolic (congestive) heart failure: Secondary | ICD-10-CM | POA: Insufficient documentation

## 2024-06-03 DIAGNOSIS — N611 Abscess of the breast and nipple: Secondary | ICD-10-CM | POA: Diagnosis present

## 2024-06-03 DIAGNOSIS — I110 Hypertensive heart disease with heart failure: Secondary | ICD-10-CM | POA: Diagnosis not present

## 2024-06-03 HISTORY — DX: Atrioventricular block, second degree: I44.1

## 2024-06-03 HISTORY — DX: Drug-induced myopathy: G72.0

## 2024-06-03 HISTORY — DX: Chronic fatigue, unspecified: R53.82

## 2024-06-03 HISTORY — DX: Rheumatoid arthritis, unspecified: M06.9

## 2024-06-03 HISTORY — DX: Long term (current) use of antithrombotics/antiplatelets: Z79.02

## 2024-06-03 HISTORY — DX: Spondylosis without myelopathy or radiculopathy, thoracic region: M47.814

## 2024-06-03 HISTORY — DX: Paroxysmal atrial fibrillation: I48.0

## 2024-06-03 HISTORY — DX: Vitamin D deficiency, unspecified: E55.9

## 2024-06-03 HISTORY — DX: Neoplasm of unspecified behavior of digestive system: D49.0

## 2024-06-03 HISTORY — DX: Left bundle-branch block, unspecified: I44.7

## 2024-06-03 HISTORY — DX: Other ventricular tachycardia: I47.29

## 2024-06-03 HISTORY — DX: Gastro-esophageal reflux disease without esophagitis: K21.9

## 2024-06-03 HISTORY — DX: Other specified diseases of pancreas: K86.89

## 2024-06-03 HISTORY — DX: Solitary cyst of left breast: N60.02

## 2024-06-03 HISTORY — DX: Atherosclerosis of aorta: I70.0

## 2024-06-03 HISTORY — DX: Unspecified systolic (congestive) heart failure: I50.20

## 2024-06-03 HISTORY — DX: Other forms of angina pectoris: I20.89

## 2024-06-03 HISTORY — DX: Bradycardia, unspecified: R00.1

## 2024-06-03 HISTORY — DX: Age-related osteoporosis without current pathological fracture: M81.0

## 2024-06-03 HISTORY — DX: Hypertensive heart disease with heart failure: I11.0

## 2024-06-03 HISTORY — DX: Long term (current) use of aspirin: Z79.82

## 2024-06-03 SURGERY — EXCISION, LESION, BREAST
Anesthesia: General | Site: Breast | Laterality: Left

## 2024-06-03 MED ORDER — PROPOFOL 1000 MG/100ML IV EMUL
INTRAVENOUS | Status: AC
  Filled 2024-06-03: qty 100

## 2024-06-03 MED ORDER — LIDOCAINE HCL (CARDIAC) PF 100 MG/5ML IV SOSY
PREFILLED_SYRINGE | INTRAVENOUS | Status: DC | PRN
  Administered 2024-06-03: 60 mg via INTRAVENOUS

## 2024-06-03 MED ORDER — PROPOFOL 10 MG/ML IV BOLUS
INTRAVENOUS | Status: DC | PRN
Start: 2024-06-03 — End: 2024-06-03
  Administered 2024-06-03: 20 mg via INTRAVENOUS
  Administered 2024-06-03: 60 mg via INTRAVENOUS

## 2024-06-03 MED ORDER — LIDOCAINE HCL (PF) 2 % IJ SOLN
INTRAMUSCULAR | Status: AC
  Filled 2024-06-03: qty 5

## 2024-06-03 MED ORDER — OXYCODONE HCL 5 MG PO TABS
5.0000 mg | ORAL_TABLET | Freq: Three times a day (TID) | ORAL | 0 refills | Status: AC | PRN
  Filled 2024-06-03: qty 6, 2d supply, fill #0

## 2024-06-03 MED ORDER — BUPIVACAINE-EPINEPHRINE 0.5% -1:200000 IJ SOLN
INTRAMUSCULAR | Status: DC | PRN
  Administered 2024-06-03: 25 mL

## 2024-06-03 MED ORDER — BUPIVACAINE-EPINEPHRINE (PF) 0.5% -1:200000 IJ SOLN
INTRAMUSCULAR | Status: AC
  Filled 2024-06-03: qty 30

## 2024-06-03 MED ORDER — DOCUSATE SODIUM 100 MG PO CAPS
100.0000 mg | ORAL_CAPSULE | Freq: Two times a day (BID) | ORAL | 0 refills | Status: AC | PRN
Start: 2024-06-03 — End: 2024-06-13
  Filled 2024-06-03: qty 20, 10d supply, fill #0

## 2024-06-03 MED ORDER — MIDAZOLAM HCL 2 MG/2ML IJ SOLN
INTRAMUSCULAR | Status: AC
  Filled 2024-06-03: qty 2

## 2024-06-03 MED ORDER — CEFAZOLIN SODIUM-DEXTROSE 2-4 GM/100ML-% IV SOLN
INTRAVENOUS | Status: AC
  Filled 2024-06-03: qty 100

## 2024-06-03 MED ORDER — FENTANYL CITRATE (PF) 100 MCG/2ML IJ SOLN
INTRAMUSCULAR | Status: AC
  Filled 2024-06-03: qty 2

## 2024-06-03 MED ORDER — ONDANSETRON HCL 4 MG/2ML IJ SOLN
INTRAMUSCULAR | Status: AC
  Filled 2024-06-03: qty 2

## 2024-06-03 MED ORDER — MIDAZOLAM HCL 2 MG/2ML IJ SOLN
INTRAMUSCULAR | Status: DC | PRN
  Administered 2024-06-03: 2 mg via INTRAVENOUS

## 2024-06-03 MED ORDER — GLYCOPYRROLATE 0.2 MG/ML IJ SOLN
INTRAMUSCULAR | Status: AC
  Filled 2024-06-03: qty 1

## 2024-06-03 MED ORDER — PROPOFOL 500 MG/50ML IV EMUL
INTRAVENOUS | Status: DC | PRN
Start: 2024-06-03 — End: 2024-06-03
  Administered 2024-06-03: 100 ug/kg/min via INTRAVENOUS

## 2024-06-03 MED ORDER — DEXAMETHASONE SODIUM PHOSPHATE 10 MG/ML IJ SOLN
INTRAMUSCULAR | Status: DC | PRN
  Administered 2024-06-03: 10 mg via INTRAVENOUS

## 2024-06-03 MED ORDER — CHLORHEXIDINE GLUCONATE 0.12 % MT SOLN
OROMUCOSAL | Status: AC
Start: 2024-06-03 — End: 2024-06-03
  Filled 2024-06-03: qty 15

## 2024-06-03 MED ORDER — DROPERIDOL 2.5 MG/ML IJ SOLN: 0.6250 mg | Freq: Once | INTRAMUSCULAR | Status: DC | PRN

## 2024-06-03 MED ORDER — GLYCOPYRROLATE 0.2 MG/ML IJ SOLN
INTRAMUSCULAR | Status: DC | PRN
  Administered 2024-06-03: .1 mg via INTRAVENOUS

## 2024-06-03 MED ORDER — FENTANYL CITRATE (PF) 100 MCG/2ML IJ SOLN: 25.0000 ug | INTRAMUSCULAR | Status: DC | PRN

## 2024-06-03 MED ORDER — DEXAMETHASONE SODIUM PHOSPHATE 10 MG/ML IJ SOLN
INTRAMUSCULAR | Status: AC
  Filled 2024-06-03: qty 1

## 2024-06-03 MED ORDER — ONDANSETRON HCL 4 MG/2ML IJ SOLN
INTRAMUSCULAR | Status: DC | PRN
  Administered 2024-06-03: 4 mg via INTRAVENOUS

## 2024-06-03 MED ORDER — FENTANYL CITRATE (PF) 100 MCG/2ML IJ SOLN
INTRAMUSCULAR | Status: DC | PRN
  Administered 2024-06-03 (×2): 25 ug via INTRAVENOUS

## 2024-06-03 SURGICAL SUPPLY — 27 items
BLADE PHOTON 3 ILLUMINATED (MISCELLANEOUS) ×1 IMPLANT
BLADE SURG 15 STRL LF DISP TIS (BLADE) ×1 IMPLANT
CHLORAPREP W/TINT 26 (MISCELLANEOUS) IMPLANT
DERMABOND ADVANCED .7 DNX12 (GAUZE/BANDAGES/DRESSINGS) ×1 IMPLANT
DEVICE DUBIN SPECIMEN MAMMOGRA (MISCELLANEOUS) ×1 IMPLANT
DRAPE LAPAROTOMY 77X122 PED (DRAPES) ×1 IMPLANT
ELECTRODE REM PT RTRN 9FT ADLT (ELECTROSURGICAL) ×1 IMPLANT
GLOVE BIOGEL PI IND STRL 7.0 (GLOVE) ×1 IMPLANT
GLOVE SURG SYN 6.5 PF PI (GLOVE) ×3 IMPLANT
GOWN STRL REUS W/ TWL LRG LVL3 (GOWN DISPOSABLE) ×3 IMPLANT
KIT MARKER MARGIN INK (KITS) ×1 IMPLANT
KIT TURNOVER KIT A (KITS) ×1 IMPLANT
LABEL OR SOLS (LABEL) ×1 IMPLANT
LIGHT WAVEGUIDE WIDE FLAT (MISCELLANEOUS) IMPLANT
MANIFOLD NEPTUNE II (INSTRUMENTS) ×1 IMPLANT
MARKER MARGIN CORRECT CLIP (MARKER) ×1 IMPLANT
NDL HYPO 22X1.5 SAFETY MO (MISCELLANEOUS) ×1 IMPLANT
NEEDLE HYPO 22X1.5 SAFETY MO (MISCELLANEOUS) ×1 IMPLANT
PACK BASIN MINOR ARMC (MISCELLANEOUS) ×1 IMPLANT
SUT SILK 3 0 12 30 (SUTURE) IMPLANT
SUT VIC AB 3-0 SH 27X BRD (SUTURE) ×1 IMPLANT
SUTURE MNCRL 4-0 27XMF (SUTURE) ×1 IMPLANT
SYR 20ML LL LF (SYRINGE) ×1 IMPLANT
SYR BULB IRRIG 60ML STRL (SYRINGE) ×1 IMPLANT
TRAP FLUID SMOKE EVACUATOR (MISCELLANEOUS) ×1 IMPLANT
TRAP NEPTUNE SPECIMEN COLLECT (MISCELLANEOUS) ×1 IMPLANT
WATER STERILE IRR 500ML POUR (IV SOLUTION) ×1 IMPLANT

## 2024-06-03 NOTE — Op Note (Signed)
 Pre-Op Dx: Left breast abscess Post-Op Dx: Same Anesthesia: MAC EBL: 15 mL Complications:  none apparent Specimen: Left breast abscess Procedure: Excision of left breast abscess Surgeon: Tye  Indications for procedure: See H&P  Description of Procedure:  Consent obtained, time out performed.  Patient placed in supine position.  Area sterilized and draped in usual position.  Local infused to area previously marked.  4 cm incision made through dermis with 15blade and infected epidermal cyst noted in subcutaneous layer.  The surrounding chronically inflamed tissue extending more cephalad and lateral beyond the initial incision so the incision was extended until all unhealthy tissue was able to be removed completely.  The initial specimen and cystlike structure measured 4 cm x 4.3 cm x 3.5 cm this was removed from surrounding tissue completely using electrocautery, passed off field pending pathology.  Wound hemostasis noted, then closed in two layer fashion with 3-0 vicryl in interrupted fashion for deep dermal layer, then running 4-0 monocryl in subcuticular fashion for epidermal layer.  Wound then dressed with dermabond.  Pt tolerated procedure well, and transferred to PACU in stable condition. Sponge and instrument count correct at end of procedure.

## 2024-06-03 NOTE — Discharge Instructions (Signed)
 Removal, Care After This sheet gives you information about how to care for yourself after your procedure. Your health care provider may also give you more specific instructions. If you have problems or questions, contact your health care provider. What can I expect after the procedure? After the procedure, it is common to have: Soreness. Bruising. Itching. Follow these instructions at home: site care Follow instructions from your health care provider about how to take care of your site. Make sure you: Wash your hands with soap and water before and after you change your bandage (dressing). If soap and water are not available, use hand sanitizer. Leave stitches (sutures), skin glue, or adhesive strips in place. These skin closures may need to stay in place for 2 weeks or longer. If adhesive strip edges start to loosen and curl up, you may trim the loose edges. Do not remove adhesive strips completely unless your health care provider tells you to do that. If the area bleeds or bruises, apply gentle pressure for 10 minutes. OK TO SHOWER IN 24HRS  Check your site every day for signs of infection. Check for: Redness, swelling, or pain. Fluid or blood. Warmth. Pus or a bad smell.  General instructions Rest and then return to your normal activities as told by your health care provider.  tylenol  as needed for discomfort.    Use narcotics, if prescribed, only when tylenol  is not enough to control pain.  325-650mg  every 8hrs to max of 3000mg /24hrs (including the 325mg  in every norco dose) for the tylenol .    RESUME  aspirin  and Plavix in 48 hours Keep all follow-up visits as told by your health care provider. This is important.  Contact a health care provider if: You have redness, swelling, or pain around your site. You have fluid or blood coming from your site. Your site feels warm to the touch. You have pus or a bad smell coming from your site. You have a fever. Your sutures, skin glue, or  adhesive strips loosen or come off sooner than expected. Get help right away if: You have bleeding that does not stop with pressure or a dressing. Summary After the procedure, it is common to have some soreness, bruising, and itching at the site. Follow instructions from your health care provider about how to take care of your site. Check your site every day for signs of infection. Contact a health care provider if you have redness, swelling, or pain around your site, or your site feels warm to the touch. Keep all follow-up visits as told by your health care provider. This is important. This information is not intended to replace advice given to you by your health care provider. Make sure you discuss any questions you have with your health care provider. Document Released: 10/05/2015 Document Revised: 03/08/2018 Document Reviewed: 03/08/2018 Elsevier Interactive Patient Education  [employer].

## 2024-06-03 NOTE — Anesthesia Postprocedure Evaluation (Signed)
 Anesthesia Post Note  Patient: Sara Hodges  Procedure(s) Performed: EXCISION, LESION, BREAST (Left: Breast)  Patient location during evaluation: PACU Anesthesia Type: General Level of consciousness: awake and alert Pain management: pain level controlled Vital Signs Assessment: post-procedure vital signs reviewed and stable Respiratory status: spontaneous breathing, nonlabored ventilation, respiratory function stable and patient connected to nasal cannula oxygen Cardiovascular status: blood pressure returned to baseline and stable Postop Assessment: no apparent nausea or vomiting Anesthetic complications: no   No notable events documented.   Last Vitals:  Vitals:   06/03/24 0845 06/03/24 0916  BP: 122/64 133/64  Pulse: 60   Resp: 12 14  Temp:  36.6 C  SpO2: 94% 100%    Last Pain:  Vitals:   06/03/24 0916  TempSrc: Oral  PainSc:                  Prentice Murphy

## 2024-06-03 NOTE — Transfer of Care (Signed)
 Immediate Anesthesia Transfer of Care Note  Patient: Sara Hodges  Procedure(s) Performed: EXCISION, LESION, BREAST (Left: Breast)  Patient Location: PACU  Anesthesia Type:MAC  Level of Consciousness: responds to stimulation  Airway & Oxygen Therapy: Patient Spontanous Breathing and Patient connected to face mask oxygen  Post-op Assessment: Report given to RN and Post -op Vital signs reviewed and stable  Post vital signs: stable  Last Vitals:  Vitals Value Taken Time  BP 118/63 06/03/24 08:18  Temp    Pulse 67 06/03/24 08:19  Resp 17 06/03/24 08:19  SpO2 100 % 06/03/24 08:19  Vitals shown include unfiled device data.  Last Pain:  Vitals:   06/03/24 0626  TempSrc: Temporal  PainSc: 0-No pain         Complications: No notable events documented.

## 2024-06-03 NOTE — Anesthesia Preprocedure Evaluation (Signed)
 Anesthesia Evaluation  Patient identified by MRN, date of birth, ID band Patient awake    Reviewed: Allergy & Precautions, H&P , NPO status , Patient's Chart, lab work & pertinent test results, reviewed documented beta blocker date and time   History of Anesthesia Complications Negative for: history of anesthetic complications  Airway Mallampati: I  TM Distance: >3 FB Neck ROM: full    Dental  (+) Dental Advidsory Given, Teeth Intact, Missing   Pulmonary neg pulmonary ROS   Pulmonary exam normal breath sounds clear to auscultation       Cardiovascular Exercise Tolerance: Good hypertension, (-) angina + CAD, + Cardiac Stents and +CHF  (-) Past MI and (-) CABG + dysrhythmias Atrial Fibrillation + pacemaker (-) Valvular Problems/Murmurs Rhythm:regular Rate:Normal     Neuro/Psych negative neurological ROS  negative psych ROS   GI/Hepatic Neg liver ROS,GERD  ,,  Endo/Other  negative endocrine ROS    Renal/GU negative Renal ROS  negative genitourinary   Musculoskeletal   Abdominal   Peds  Hematology negative hematology ROS (+)   Anesthesia Other Findings Past Medical History: No date: Anemia No date: Aortic atherosclerosis (HCC) No date: Arthritis No date: Atypical angina (HCC) No date: Bradycardia No date: Breast cyst, left No date: CAD (coronary artery disease)     Comment:  a.) LHC/PCI 11/20/2023: 90% mid LAD (3.0 x 22 mm               [employer] Onyx DES) No date: Chronic fatigue No date: Dilated pancreatic duct No date: GERD (gastroesophageal reflux disease) No date: Headache No date: HFrEF (heart failure with reduced ejection fraction) (HCC) No date: HLD (hyperlipidemia) No date: Hypertension No date: Hypertensive cardiomyopathy, with heart failure (HCC) 07/13/2014: Infarction of left basal ganglia (HCC)     Comment:  a.) MRI brain 07/13/2014: acute/subacute infarct in the               LEFT basal ganglia  involving the putamen and body of the               caudate No date: IPMN (intraductal papillary mucinous neoplasm)     Comment:  a.) single foci of high-grade dysplasia; s/p               pancreaticoduodenectomy 12/2022 No date: LBBB (left bundle branch block) No date: Long term current use of clopidogrel No date: Long-term use of aspirin  therapy No date: Mobitz type 1 second degree atrioventricular block     Comment:  a.) s/p PPM 09/09/2019 No date: NSVT (nonsustained ventricular tachycardia) (HCC) No date: Osteoporosis No date: PAF (paroxysmal atrial fibrillation) (HCC)     Comment:  a.) CHA2DS2VASc = 7 (age, sex, HFpEF, HTN, CVA x 2,               vascular disease) as of 06/02/2024; b.) rate/rhythm               maintained on metoprolol succinate; no chronic OAC (is on              DAPT therapy) No date: Pneumonia 09/09/2019: Presence of permanent cardiac pacemaker     Comment:  a.) Company secretary dual chamber               pacemaker No date: RA (rheumatoid arthritis) (HCC) No date: Statin myopathy No date: Thoracic spondylosis No date: Vitamin D  deficiency   Reproductive/Obstetrics negative OB ROS  Anesthesia Physical Anesthesia Plan  ASA: 3  Anesthesia Plan: General   Post-op Pain Management:    Induction: Intravenous  PONV Risk Score and Plan: 3 and Ondansetron , Dexamethasone  and Treatment may vary due to age or medical condition  Airway Management Planned: LMA  Additional Equipment:   Intra-op Plan:   Post-operative Plan: Extubation in OR  Informed Consent: I have reviewed the patients History and Physical, chart, labs and discussed the procedure including the risks, benefits and alternatives for the proposed anesthesia with the patient or authorized representative who has indicated his/her understanding and acceptance.     Dental Advisory Given  Plan Discussed with: Anesthesiologist,  CRNA and Surgeon  Anesthesia Plan Comments:          Anesthesia Quick Evaluation

## 2024-06-03 NOTE — Interval H&P Note (Signed)
 No change. Ok to proceed

## 2024-06-03 NOTE — H&P (Signed)
 Subjective:   CC: Breast abscess of female [N61.1]  HPI: Returns for evaluation of above.   History of Present Illness Sara Hodges is a 73 year old female who presents with left breast discomfort.  She experiences sharp discomfort under her left breast, described as a node causing pain, since an ultrasound-guided aspiration for a left breast abscess on April 15, 2024. She completed a course of antibiotics following the procedure. Her symptoms had completely resolved for a period of time after the drainage procedure, but has recurred. Her pain has been decreasing over the past few days, but she is concerned about the possibility of the abscess reforming. No fever, chills, diarrhea, constipation, or headache.    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/2017), Cardiac pacemaker (12/01/2017), Cardiomyopathy, secondary (CMS/HHS-HCC), Carotid artery occlusion, CHF (congestive heart failure) (CMS/HHS-HCC), Coronary artery disease, Encounter for care of pacemaker (09/17/2018), Heart disease (5 yrs ago), History of atrial fibrillation (12/08/2017), HTN (hypertension), Hyperlipidemia, Migraines, Motion sickness, Myocardial infarction (CMS/HHS-HCC), Neuropathic pain of foot, NSVT (nonsustained ventricular tachycardia) (CMS/HHS-HCC) (09/28/2018), Pacemaker, Pancreatic cyst (HHS-HCC) (12/25/2014), Pleurisy, Preoperative evaluation to rule out surgical contraindication (12/29/2022), and Rheumatoid arthritis (CMS/HHS-HCC) (Hand and legs).  Past Surgical History: has a past surgical history that includes Abdominal hysterectomy (1980s); hand surgery (Left); foot surgery (Left); Colonoscopy (03/19/2006, 08/25/2011); egd (08/25/2011); Cardiac Catheterization (06/2014); Colonoscopy; Upper gastrointestinal endoscopy (08/2019); Upper gastrointestinal endoscopy (N/A, 06/25/2016); Insert /  replace / remove pacemaker (2018); esophagogastrodoudenoscopy w/ultrasound guided aspiration/biopsy (N/A, 09/13/2019); Hysterectomy; arthroscopic rotator cuff repair (Right, 02/04/2022); arthroscopy shoulder w/debridement (Right, 02/04/2022); tenodesis biceps w/transplantation long tendon open (Right, 02/04/2022); arthroscopic subacromial decomp (Right, 02/04/2022); Upper gastrointestinal endoscopy (N/A, 12/03/2022); extraction cataract extracapsular w/insertion intraocular prosthesis (Right, 10/05/2023); and extraction cataract extracapsular w/insertion intraocular prosthesis (Left, 10/19/2023).  Family History: family history includes Allergies in her mother; Breast cancer in an other family member; Cancer in her mother and sister; Cardiomyopathy (Abnormal function of the heart muscle) in her mother; Colon cancer in an other family member; Colon polyps in an other family member; Heart disease in her mother; High blood pressure (Hypertension) in an other family member; Liver disease in her maternal grandfather; Lung cancer in her mother; Myocardial Infarction (Heart attack) in an other family member; No Known Problems in her daughter and daughter; Prostate cancer in her father; Rheum arthritis in her daughter; Uterine cancer in her sister.  Social History: reports that she has never smoked. She has never used smokeless tobacco. She reports that she does not currently use alcohol. She reports that she does not use drugs.  Current Medications: has a current medication list which includes the following prescription(s): acetaminophen , clopidogrel, ergocalciferol  (vitamin d2), repatha sureclick, folic acid , furosemide , metoprolol succinate, pantoprazole, polyethylene glycol, entresto , aspirin , and nitroglycerin.  Allergies:  Allergies  Allergen Reactions  Bactrim [Sulfamethoxazole-Trimethoprim] Hives and Abdominal Pain  Beta-Blockers (Beta-Adrenergic Blocking Agts) Other (See Comments)  Second degree heart  block  Statins-Hmg-Coa Reductase Inhibitors Muscle Pain  Multiple statins including atorvastatin, rosuva, prava  Amoxicillin-Pot Clavulanate Nausea and Other (See Comments)  Has patient had a PCN reaction causing immediate rash, facial/tongue/throat swelling, SOB or lightheadedness with hypotension: No Has patient had a PCN reaction causing severe rash involving mucus membranes or skin necrosis: No Has patient had a PCN reaction that required hospitalization No Has patient had a PCN reaction occurring within the last 10 years: unknown If all of the above answers are NO, then may proceed  with Cephalosporin use.  Tramadol Nausea   ROS:  A 15 point review of systems was performed and pertinent positives and negatives noted in HPI  Objective:    BP 125/66  Pulse 79  Ht 167.6 cm (5' 6)  Wt 75.3 kg (166 lb)  BMI 26.79 kg/m   Constitutional : No distress, cooperative, alert  Lymphatics/Throat: Supple with no lymphadenopathy  Respiratory: Clear to auscultation bilaterally  Cardiovascular: Regular rate and rhythm  Gastrointestinal: Soft, non-tender, non-distended, no organomegaly.  Musculoskeletal: Steady gait and movement  Skin: Cool and moist. Chaperone present for exam of left breast, where evidence of healing abscess noted near mammary fold. Some TTP but no obvious induration, fluctuance, skin changes to indicate active infection. No easily palpable cyst like structure.  Psychiatric: Normal affect, non-agitated, not confused     LABS:  N/a   RADS: CLINICAL DATA: Patient is status post ultrasound-guided aspiration  of a LEFT abscess which demonstrated Gram positive cocci on gram  stain without growth. Patient was prescribed and has completed 5  days of doxycycline . Patient reports improvement in symptomatology  but a persistent dermal area of concern.   EXAM:  ULTRASOUND OF THE LEFT BREAST   COMPARISON: Previous exams   FINDINGS:  On physical exam, there is a  hyperpigmented superficial shiny raised  area along the LEFT lower breast. There is adjacent  hyperpigmentation. No significant erythema.   Targeted ultrasound was performed of the LEFT breast. At 6 o'clock 7  cm from the nipple, there is revisualization of an intradermal  heterogeneous collection. This spans 2.5 x 3.1 x 0.6 cm, previously  3.2 by 0.9 by 3.0 cm when measured similarly. It is more planar in  appearance on today's exam with a predominately phlegmon in  appearance with hyperemia and trace interdigitating fluid. Overall  extent of interdigitating fluid is decreased compared to prior.   IMPRESSION:  1. Decrease in size of a LEFT breast intradermal abscess which may  reflect a superimposed infection of a chronic intradermal structure  such as an epidermal inclusion cyst. The residual area is  predominantly phlegmonous in appearance. Recommend clinical  follow-up to resolution with follow-up with provider/surgeon for any  additional antibiotics if clinically indicated. Recommend  consideration of dermatology/surgical consultation for excision  after resolution of acute inflammatory state.   RECOMMENDATION:  1. Recommend clinical follow-up to resolution with follow-up with  provider/surgeon for any additional antibiotics if clinically  indicated. Recommend consideration of dermatology/surgical  consultation for excision after resolution of acute inflammatory  state.  2. Should infection symptoms worsen, repeat ultrasound with  potential aspiration could be considered.  3. Bilateral annual screening mammogram, due October 2025   I have discussed the findings and recommendations with the patient.  If applicable, a reminder letter will be sent to the patient  regarding the next appointment.   BI-RADS CATEGORY 2: Benign.   Electronically Signed  By: Corean Salter M.D.  On: 04/15/2024 11:38   Assessment:    Breast abscess of female [N61.1]- likely infected  cyst  Plan:    1. Breast abscess of female [N61.1] skin changes persist, with report concerning for subclinical infection, likely recurrent. With US  findings above, will proceed with formal excision to prevent recurrent issues, understanding higher risk due to comorbidities  Cardiology to risk stratify and also recommend when to stop plavix and possibly aspirin . Will schedule  labs/images/medications/previous chart entries reviewed personally and relevant changes/updates noted above.

## 2024-06-04 ENCOUNTER — Encounter: Payer: Self-pay | Admitting: Surgery

## 2024-06-06 LAB — SURGICAL PATHOLOGY

## 2024-06-08 ENCOUNTER — Encounter

## 2024-06-09 ENCOUNTER — Encounter

## 2024-06-15 ENCOUNTER — Encounter

## 2024-06-15 DIAGNOSIS — Z955 Presence of coronary angioplasty implant and graft: Secondary | ICD-10-CM

## 2024-06-15 NOTE — Progress Notes (Signed)
 Daily Session Note  Patient Details  Name: Sara Hodges MRN: [id number] Date of Birth: 07/01/1951 Referring Provider:   Flowsheet Row Cardiac Rehab from 01/04/2024 in [facility]  Referring Provider Clarinda Sharper MD    Encounter Date: 06/15/2024  Check In:  Session Check In - 06/15/24 0923       Check-In   Supervising physician immediately available to respond to emergencies See telemetry face sheet for immediately available ER MD    Location [facility]-Cardiac & Pulmonary Rehab    Staff Present Burnard Davenport [facility] Peggi, RN, DNP, NE-BC;Maxon Conetta BS, Exercise Physiologist;Joseph Rolinda NORWOOD HARMAN Hobert Best, MS, Exercise Physiologist    Virtual Visit No    Medication changes reported     No    Fall or balance concerns reported    No    Warm-up and Cool-down Performed on first and last piece of equipment    Resistance Training Performed Yes    VAD Patient? No    PAD/SET Patient? No      Pain Assessment   Currently in Pain? No/denies             Social History   Tobacco Use  Smoking Status Never  Smokeless Tobacco Never    Goals Met:  Independence with exercise equipment Exercise tolerated well No report of concerns or symptoms today Strength training completed today  Goals Unmet:  Not Applicable  Comments: Pt able to follow exercise prescription today without complaint.  Will continue to monitor for progression.    Dr. Oneil Pinal is Medical Director for [facility] Cardiac [facility].  Dr. Fuad Aleskerov is Medical Director for [facility].

## 2024-06-15 NOTE — Progress Notes (Signed)
 Cardiac Individual Treatment Plan  Patient Details  Name: APOLLONIA AMINI MRN: [id number] Date of Birth: [birth year] Referring Provider:   Flowsheet Row Cardiac Rehab from 01/04/2024 in [address] [facility] Cardiac and Pulmonary Rehab  Referring Provider Clarinda Sharper MD    Initial Encounter Date:  Flowsheet Row Cardiac Rehab from 01/04/2024 in [facility] Cardiac and Pulmonary Rehab  Date 01/04/24    Visit Diagnosis: Status post coronary artery stent placement  Patient's Home Medications on Admission:  Current Outpatient Medications:    acetaminophen  (TYLENOL ) 500 MG tablet, Take 1,000 mg by mouth every 6 (six) hours as needed for moderate pain (pain score 4-6)., Disp: , Rfl:    aspirin  EC 81 MG tablet, Take 81 mg by mouth daily. , Disp: , Rfl:    clopidogrel (PLAVIX) 75 MG tablet, Take 75 mg by mouth daily., Disp: , Rfl:    ENTRESTO  49-51 MG, Take 1 tablet by mouth 2 (two) times daily., Disp: , Rfl:    Evolocumab (REPATHA SURECLICK) 140 MG/ML SOAJ, Inject 140 mg into the skin every 14 (fourteen) days., Disp: , Rfl:    folic acid  (FOLVITE ) 1 MG tablet, Take 1 mg by mouth daily., Disp: , Rfl:    furosemide  (LASIX ) 20 MG tablet, Take 20 mg by mouth daily as needed for fluid., Disp: , Rfl:    loratadine (CLARITIN) 10 MG tablet, Take 10 mg by mouth daily as needed for allergies., Disp: , Rfl:    metoprolol succinate (TOPROL-XL) 25 MG 24 hr tablet, Take 12.5 mg by mouth daily., Disp: , Rfl:    nitroGLYCERIN (NITROSTAT) 0.4 MG SL tablet, Place under the tongue., Disp: , Rfl:    oxyCODONE  (ROXICODONE ) 5 MG immediate release tablet, Take 1 tablet (5 mg total) by mouth every 8 (eight) hours as needed for severe pain (pain score 7-10) or moderate pain (pain score 4-6)., Disp: 6 tablet, Rfl: 0   pantoprazole (PROTONIX) 40 MG tablet, Take 40 mg by mouth daily., Disp: , Rfl:    Vitamin D , Ergocalciferol , (DRISDOL ) 50000 UNITS CAPS capsule, Take 50,000 Units by mouth every 7 (seven) days. 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   a.) MRI brain 07/13/2014: acute/subacute infarct in the LEFT basal ganglia involving the putamen and body of the caudate   IPMN (intraductal papillary mucinous neoplasm)    a.) single foci of high-grade dysplasia; s/p pancreaticoduodenectomy 12/2022   LBBB (left bundle branch block)    Long term current use of clopidogrel    Long-term use of aspirin  therapy    Mobitz type 1 second degree atrioventricular block    a.) s/p PPM 09/09/2019   NSVT (nonsustained ventricular tachycardia) (HCC)    Osteoporosis    PAF (paroxysmal atrial fibrillation) (HCC)    a.) CHA2DS2VASc = 7 (age, sex, HFpEF, HTN, CVA x  2, vascular disease) as of 06/02/2024; b.) rate/rhythm maintained on metoprolol succinate; no chronic OAC (is on DAPT therapy)   Pneumonia    Presence of permanent cardiac 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   Exercise Prescription Goal: Initial exercise prescription builds to 30-45 minutes a day of aerobic activity, 2-3 days per week.  Home exercise guidelines will be given to patient during program as part of exercise prescription that the participant will acknowledge.   Education: Aerobic Exercise: - Group verbal and visual presentation on the components of exercise prescription. Introduces F.I.T.T principle from ACSM for exercise prescriptions.  Reviews F.I.T.T. principles of aerobic exercise including progression. Written material provided at class time.   Education: Resistance Exercise: - Group verbal and visual presentation on the components of exercise prescription. Introduces F.I.T.T principle from ACSM for exercise prescriptions  Reviews F.I.T.T. principles of resistance exercise including progression. Written material provided at class time.    Education: Exercise & Equipment Safety: - Individual verbal instruction and demonstration of equipment use and safety with use of the equipment. Flowsheet Row Cardiac Rehab from 03/23/2024 in [facility] Cardiac and Pulmonary Rehab  Date 01/04/24  Educator MB  Instruction Review Code 1- Verbalizes Understanding    Education: Exercise Physiology & General Exercise Guidelines: - Group verbal and written instruction with models to review the exercise physiology of the cardiovascular system and associated critical values. Provides general exercise guidelines with specific guidelines to those with heart or lung disease. Written material provided at class time.   Education: Flexibility, Balance, Mind/Body Relaxation: - Group verbal and visual presentation with interactive activity on the components of exercise prescription. Introduces F.I.T.T principle from ACSM for exercise prescriptions. Reviews F.I.T.T. principles of flexibility and balance exercise training including progression. Also discusses the mind body connection.  Reviews various  relaxation techniques to help reduce and manage stress (i.e. Deep breathing, progressive muscle relaxation, and visualization). Balance handout provided to take home. Written material provided at class time.   Activity Barriers & Risk Stratification:  Activity Barriers & Cardiac Risk Stratification - 01/04/24 1352       Activity Barriers & Cardiac Risk Stratification   Activity Barriers Back Problems;Other (comment)    Comments L leg aches (mainly at night), occasional low and mid back pain due to degenerative disc disease    Cardiac Risk Stratification Moderate          6 Minute Walk:  6 Minute Walk     Row Name 01/04/24 1350         6 Minute Walk   Phase Initial     Distance 1390 feet     Walk Time 6 minutes     # of Rest Breaks 0     MPH 2.63     METS 2.83     RPE 9     Perceived Dyspnea  0     VO2 Peak 9.89  Symptoms No     Resting HR 69 bpm     Resting BP 108/70     Resting Oxygen Saturation  99 %     Exercise Oxygen Saturation  during 6 min walk 100 %     Max Ex. HR 90 bpm     Max Ex. BP 146/68     2 Minute Post BP 118/66        Oxygen Initial Assessment:   Oxygen Re-Evaluation:   Oxygen Discharge (Final Oxygen Re-Evaluation):   Initial Exercise Prescription:  Initial Exercise Prescription - 01/04/24 1300       Date of Initial Exercise RX and Referring Provider   Date 01/04/24    Referring Provider Clarinda Sharper MD      Oxygen   Maintain Oxygen Saturation 88% or higher      Recumbant Bike   Level 2    RPM 50    Watts 20    Minutes 15    METs 2.83      NuStep   Level 2    SPM 80    Minutes 15    METs 2.83      REL-XR   Level 1    Speed 50    Minutes 15    METs 2.83      T5 Nustep   Level 2    SPM 80    Minutes 15    METs 2.83      Track   Laps 33    Minutes 15    METs 2.79      Prescription Details   Frequency (times per week) 2    Duration Progress to 30 minutes of continuous aerobic without signs/symptoms of  physical distress      Intensity   THRR 40-80% of Max Heartrate 100-131    Ratings of Perceived Exertion 11-13    Perceived Dyspnea 0-4      Progression   Progression Continue to progress workloads to maintain intensity without signs/symptoms of physical distress.      Resistance Training   Training Prescription Yes    Weight 4 lb    Reps 10-15          Perform Capillary Blood Glucose checks as needed.  Exercise Prescription Changes:   Exercise Prescription Changes     Row Name 01/04/24 1300 01/14/24 1800 01/26/24 1500 02/11/24 1800 02/25/24 1600     Response to Exercise   Blood Pressure (Admit) 108/70 102/58 102/60 134/62 122/60   Blood Pressure (Exercise) 146/68 130/60 148/62 144/58 122/58   Blood Pressure (Exit) 118/66 108/60 110/62 118/70 110/62   Heart Rate (Admit) 69 bpm 82 bpm 77 bpm 72 bpm 79 bpm   Heart Rate (Exercise) 90 bpm 90 bpm 116 bpm 116 bpm 115 bpm   Heart Rate (Exit) 69 bpm 74 bpm 93 bpm 85 bpm 89 bpm   Oxygen Saturation (Admit) 99 % -- -- -- --   Oxygen Saturation (Exercise) 100 % -- -- -- --   Oxygen Saturation (Exit) 100 % -- -- -- --   Rating of Perceived Exertion (Exercise) 9 11 13 13 14    Perceived Dyspnea (Exercise) 0 -- -- -- --   Symptoms none none none none none   Comments results 1st full day of exercise -- -- --   Duration Progress to 30 minutes of  aerobic without signs/symptoms of physical distress Continue with 30 min of aerobic exercise without signs/symptoms of physical distress. Continue with 30 min  of aerobic exercise without signs/symptoms of physical distress. Continue with 30 min of aerobic exercise without signs/symptoms of physical distress. Continue with 30 min of aerobic exercise without signs/symptoms of physical distress.   Intensity THRR New THRR unchanged THRR unchanged THRR unchanged THRR unchanged     Progression   Progression Continue to progress workloads to maintain intensity without signs/symptoms of physical  distress. Continue to progress workloads to maintain intensity without signs/symptoms of physical distress. Continue to progress workloads to maintain intensity without signs/symptoms of physical distress. Continue to progress workloads to maintain intensity without signs/symptoms of physical distress. Continue to progress workloads to maintain intensity without signs/symptoms of physical distress.   Average METs 2.83 3.38 3.07 3.17 3.38     Resistance Training   Training Prescription -- Yes Yes Yes Yes   Weight -- 4 lb 5 lb 5 lb 5 lb   Reps -- 10-15 10-15 10-15 10-15     Interval Training   Interval Training -- No No No No     Treadmill   MPH -- -- 2.5 2.6 --   Grade -- -- 0 0.5 --   Minutes -- -- 15 15 --   METs -- -- 2.91 3.17 --     Recumbant Bike   Level -- -- 5 5 8    Watts -- -- 39 20 57   Minutes -- -- 15 15 15    METs -- -- 2.81 2.8 4.28     NuStep   Level -- 2 4 4 4    Minutes -- 15 15 15 15    METs -- 5 4 4.4 3.9     REL-XR   Level -- -- 6 -- --   Minutes -- -- 15 -- --   METs -- -- 4.5 -- --     T5 Nustep   Level -- -- 3 3 2    Minutes -- -- 15 15 15    METs -- -- 2.2 2.3 3     Track   Laps -- 14 14 -- --   Minutes -- 15 15 -- --   METs -- 1.76 1.76 -- --     Oxygen   Maintain Oxygen Saturation -- 88% or higher 88% or higher 88% or higher 88% or higher    Row Name 03/09/24 0900 03/10/24 0900 03/24/24 1500 04/06/24 0900 05/04/24 0900     Response to Exercise   Blood Pressure (Admit) -- 116/54 108/62 110/58 114/52   Blood Pressure (Exit) -- 106/58 96/62 112/70 110/58   Heart Rate (Admit) -- 74 bpm 68 bpm 74 bpm 81 bpm   Heart Rate (Exercise) -- 115 bpm 117 bpm 117 bpm 110 bpm   Heart Rate (Exit) 89 bpm 88 bpm 94 bpm 77 bpm 77 bpm   Rating of Perceived Exertion (Exercise) -- 13 15 12 13    Symptoms -- none none none none   Duration Continue with 30 min of aerobic exercise without signs/symptoms of physical distress. Continue with 30 min of aerobic exercise  without signs/symptoms of physical distress. Continue with 30 min of aerobic exercise without signs/symptoms of physical distress. Continue with 30 min of aerobic exercise without signs/symptoms of physical distress. Continue with 30 min of aerobic exercise without signs/symptoms of physical distress.   Intensity THRR unchanged THRR unchanged THRR unchanged THRR unchanged THRR unchanged     Progression   Progression Continue to progress workloads to maintain intensity without signs/symptoms of physical distress. Continue to progress workloads to maintain intensity without signs/symptoms of  physical distress. Continue to progress workloads to maintain intensity without signs/symptoms of physical distress. Continue to progress workloads to maintain intensity without signs/symptoms of physical distress. Continue to progress workloads to maintain intensity without signs/symptoms of physical distress.   Average METs 3.38 3.61 3.42 4.73 2.78     Resistance Training   Training Prescription Yes Yes Yes Yes Yes   Weight 5 lb 5 lb 5 lb 5 lb 5 lb   Reps 10-15 10-15 10-15 10-15 10-15     Interval Training   Interval Training No No No No No     Treadmill   MPH -- -- 2.6 2.6 --   Grade -- -- 2.5 2.6 --   Minutes -- -- 15 15 --   METs -- -- 3.89 3.89 --     Recumbant Bike   Level 8 8 8  -- 3   Watts 57 54 51 -- 26   Minutes 15 15 15  -- 15   METs 4.28 4.32 4.28 -- 3.07     NuStep   Level 4 4 3  -- --   Minutes 15 15 13  -- --   METs 3.9 4.6 2 -- --     REL-XR   Level -- -- -- 9 --   Minutes -- -- -- 15 --   METs -- -- -- 6.9 --     T5 Nustep   Level 2 4 4  -- 5   Minutes 15 15 15  -- 15   METs 3 2.2 2.5 -- 2.5     Biostep-RELP   Level -- -- 2 -- --   Minutes -- -- 15 -- --   METs -- -- 2 -- --     Rower   Level -- -- -- 10 --   Minutes -- -- -- 15 --   METs -- -- -- 30 --     Home Exercise Plan   Plans to continue exercise at Home (comment)  walk Home (comment)  walk Home (comment)   walk Home (comment)  walk Home (comment)  walk   Frequency Add 2 additional days to program exercise sessions. Add 2 additional days to program exercise sessions. Add 2 additional days to program exercise sessions. Add 2 additional days to program exercise sessions. Add 2 additional days to program exercise sessions.   Initial Home Exercises Provided 03/09/24 03/09/24 03/09/24 03/09/24 03/09/24     Oxygen   Maintain Oxygen Saturation 88% or higher 88% or higher 88% or higher 88% or higher 88% or higher    Row Name 05/31/24 1400             Response to Exercise   Blood Pressure (Admit) 106/72       Blood Pressure (Exit) 100/80       Heart Rate (Admit) 77 bpm       Heart Rate (Exercise) 111 bpm       Heart Rate (Exit) 89 bpm       Rating of Perceived Exertion (Exercise) 14       Symptoms none       Duration Continue with 30 min of aerobic exercise without signs/symptoms of physical distress.       Intensity THRR unchanged         Progression   Progression Continue to progress workloads to maintain intensity without signs/symptoms of physical distress.       Average METs 4.22         Resistance Training   Training Prescription Yes  Weight 5 lb       Reps 10-15         Interval Training   Interval Training No         Recumbant Bike   Level 10       Watts 80       Minutes 15       METs 5.28         NuStep   Level 5       Minutes 15       METs 3.8         Home Exercise Plan   Plans to continue exercise at Home (comment)  walk       Frequency Add 2 additional days to program exercise sessions.       Initial Home Exercises Provided 03/09/24         Oxygen   Maintain Oxygen Saturation 88% or higher          Exercise Comments:   Exercise Comments     Row Name 01/06/24 1045           Exercise Comments First full day of exercise!  Patient was oriented to gym and equipment including functions, settings, policies, and procedures.  Patient's individual exercise  prescription and treatment plan were reviewed.  All starting workloads were established based on the results of the 6 minute walk test done at initial orientation visit.  The plan for exercise progression was also introduced and progression will be customized based on patient's performance and goals.          Exercise Goals and Review:   Exercise Goals     Row Name 01/04/24 1356             Exercise Goals   Increase Physical Activity Yes       Intervention Provide advice, education, support and counseling about physical activity/exercise needs.;Develop an individualized exercise prescription for aerobic and resistive training based on initial evaluation findings, risk stratification, comorbidities and participant's personal goals.       Expected Outcomes Short Term: Attend rehab on a regular basis to increase amount of physical activity.;Long Term: Add in home exercise to make exercise part of routine and to increase amount of physical activity.;Long Term: Exercising regularly at least 3-5 days a week.       Increase Strength and Stamina Yes       Intervention Provide advice, education, support and counseling about physical activity/exercise needs.;Develop an individualized exercise prescription for aerobic and resistive training based on initial evaluation findings, risk stratification, comorbidities and participant's personal goals.       Expected Outcomes Short Term: Increase workloads from initial exercise prescription for resistance, speed, and METs.;Short Term: Perform resistance training exercises routinely during rehab and add in resistance training at home;Long Term: Improve cardiorespiratory fitness, muscular endurance and strength as measured by increased METs and functional capacity ( )       Able to understand and use rate of perceived exertion (RPE) scale Yes       Intervention Provide education and explanation on how to use RPE scale       Expected Outcomes Short Term: Able  to use RPE daily in rehab to express subjective intensity level;Long Term:  Able to use RPE to guide intensity level when exercising independently       Able to understand and use Dyspnea scale Yes       Intervention Provide education and explanation on how to use Dyspnea scale  Expected Outcomes Short Term: Able to use Dyspnea scale daily in rehab to express subjective sense of shortness of breath during exertion;Long Term: Able to use Dyspnea scale to guide intensity level when exercising independently       Knowledge and understanding of Target Heart Rate Range (THRR) Yes       Intervention Provide education and explanation of THRR including how the numbers were predicted and where they are located for reference       Expected Outcomes Short Term: Able to state/look up THRR;Short Term: Able to use daily as guideline for intensity in rehab;Long Term: Able to use THRR to govern intensity when exercising independently       Able to check pulse independently Yes       Intervention Provide education and demonstration on how to check pulse in carotid and radial arteries.;Review the importance of being able to check your own pulse for safety during independent exercise       Expected Outcomes Short Term: Able to explain why pulse checking is important during independent exercise;Long Term: Able to check pulse independently and accurately       Understanding of Exercise Prescription Yes       Intervention Provide education, explanation, and written materials on patient's individual exercise prescription       Expected Outcomes Short Term: Able to explain program exercise prescription;Long Term: Able to explain home exercise prescription to exercise independently          Exercise Goals Re-Evaluation :  Exercise Goals Re-Evaluation     Row Name 01/06/24 1045 01/14/24 1805 01/26/24 1527 02/11/24 1813 02/25/24 1629     Exercise Goal Re-Evaluation   Exercise Goals Review Able to understand and use  rate of perceived exertion (RPE) scale;Knowledge and understanding of Target Heart Rate Range (THRR);Understanding of Exercise Prescription;Able to understand and use Dyspnea scale Increase Physical Activity;Understanding of Exercise Prescription;Increase Strength and Stamina Increase Physical Activity;Understanding of Exercise Prescription;Increase Strength and Stamina Increase Physical Activity;Understanding of Exercise Prescription;Increase Strength and Stamina Increase Physical Activity;Understanding of Exercise Prescription;Increase Strength and Stamina   Comments Reviewed RPE and dyspnea scale, THR and program prescription with pt today.  Pt voiced understanding and was given a copy of goals to take home. Ota completed her first exercise session in this review. She was able to do level 2 on the T4 nustep and 14 laps on the track. We will continue to monitor her progress in the program. Nahia is doing well in the rehab. She improved to level 3 on the T5 nustep, level 4 on the T4 nustep, level 5 on the recumbent bike, and level 6 on the XR. She also began using the treadmill at a speed of 2.5 mph with no incline. We will continue to monitor her progress in the program. Marney is doing well in the rehab. She increased her workload on the treadmill to a speed of 2.6 mph and incline of 0.5%. She maintained level 4 on the T4 nustep, level 3 on the T5 nustep, and level 5 on the recumbent bike. We will continue to monitor her progress in the program. Rushie continues to do well in rehab. She recently improved to level 8 on the recumbent bike. She has not done any walking on the treadmill since the last review. We will continue to monitor her progress in the program.   Expected Outcomes Short: Use RPE daily to regulate intensity. Long: Follow program prescription in THR. Short: Continue to follow current exercise prescription. Long:  Continue exercise to improve strength and stamina. Short: Continue to progressively  increase treadmill workload. Long: Continue exercise to improve strength and stamina. Short: Try level 4 on the T5 nustep. Long: Continue exercise to improve strength and stamina. Short: Return to walking on the treadmill in the program. Long: Continue exercise to improve strength and stamina.    Row Name 03/09/24 9045 03/10/24 0924 03/24/24 1535 03/24/24 1536 03/24/24 1539     Exercise Goal Re-Evaluation   Exercise Goals Review Increase Physical Activity;Able to understand and use rate of perceived exertion (RPE) scale;Knowledge and understanding of Target Heart Rate Range (THRR);Understanding of Exercise Prescription;Increase Strength and Stamina;Able to understand and use Dyspnea scale;Able to check pulse independently Increase Physical Activity;Increase Strength and Stamina;Understanding of Exercise Prescription Increase Physical Activity;Increase Strength and Stamina;Understanding of Exercise Prescription -- --   Comments Reviewed home exercise with pt today.  Pt plans to walk for exercise.  Reviewed THR, pulse, RPE, sign and symptoms, pulse oximetery and when to call 911 or MD.  Also discussed weather considerations and indoor options.  Pt voiced understanding. Rogelio is doing well in rehab. She recently improved to level 4 on the T5 nustep, and continued to work at level 8 on the recumbent bike. She has not done any walking on the treadmill since the last review. We will continue to monitor her progress in the program. Tiasia continues to do well with her exercise progression.  She Lama continues to do well with her exercise progression.  She increased the RB from level 1 to level 8. T5 from level 1 to level 4, BS from level 1 to level 2 and T4 from levle 2 to level 3. Maintained Bike at level 1. Chloe continues to do well with her exercise progression.  She increased the RB from level 1 to level 8. T5 from level 1 to level 4, BS from level 1 to level 2 and T4 from levle 2 to level 3. Maintained Bike at  level 1.  WIll continue to monitor her progress   Expected Outcomes Short: add 1-2 days a week of home exercise on off days of cardiac rehab. Long: maintain independent exercise routine upon graduation from cardiac rehab. Short: Return to walking the treadmill in rehab. Long: Continue exercise to improve strength and stamina. -- -- STG continue with good attendance, exercise progression as tolerated  LTG exercise progression continues after discharge    Row Name 04/06/24 0928 04/20/24 1118 05/04/24 0908 05/19/24 0804 05/31/24 1407     Exercise Goal Re-Evaluation   Exercise Goals Review Increase Physical Activity;Increase Strength and Stamina;Understanding of Exercise Prescription Increase Physical Activity;Increase Strength and Stamina;Understanding of Exercise Prescription Increase Physical Activity;Increase Strength and Stamina;Understanding of Exercise Prescription Increase Physical Activity;Increase Strength and Stamina;Understanding of Exercise Prescription Increase Physical Activity;Increase Strength and Stamina;Understanding of Exercise Prescription   Comments Inger is doing well in rehab. She was able to use the XR at level 9 and was also able to maintain a treadmill workload of 2.6mph and 2.5% incline. She was also able to add the rowing machine at level 10. We will continue to monitor her progress in the program. Kataryna has not attended rehab since 07/09. We will stay in contact with her to determine her status in the program. Chelan has returned to rehab and is doing well. She was able to attend one session during this review period. During her session she was able to increase from level 4 to 5 on the T5 nustep. We will continue  to monitor her progress in the program. Melodie has not attended rehab since the last review. She last attended on 08/27. We will contact her to see when she plans to return. We will continue to monitor her progress when she returns to the program. Romelia has only attended two  sessions since the last review. During her two sessions she improved to level 5 on the T4 nustep and level 10 on the recumbent bike. We will continue to monitor her progress in the program.   Expected Outcomes Short: Improve on . Long: Continue exercise to improve strength and stamina. Short: Return to rehab. Long: Continue exercise to improve strength and stamina. Short: Attend rehab more frequently. Long: Continue exercise to improve strength and stamina. Short: Return to rehab when appropriate. Long: Graduate. Short: Attend rehab more consistently. Long: Continue exercise to improve strength and stamina.    Row Name 06/15/24 1006             Exercise Goal Re-Evaluation   Exercise Goals Review Increase Physical Activity;Increase Strength and Stamina;Understanding of Exercise Prescription       Comments Dezaray is back at rehab after procedure and hoping to be more consistent. She is not doing much home exercise since her procedure       Expected Outcomes STG: Attend rehab more consistently. LTG: Continue exercise to improve strength and stamina.          Discharge Exercise Prescription (Final Exercise Prescription Changes):  Exercise Prescription Changes - 05/31/24 1400       Response to Exercise   Blood Pressure (Admit) 106/72    Blood Pressure (Exit) 100/80    Heart Rate (Admit) 77 bpm    Heart Rate (Exercise) 111 bpm    Heart Rate (Exit) 89 bpm    Rating of Perceived Exertion (Exercise) 14    Symptoms none    Duration Continue with 30 min of aerobic exercise without signs/symptoms of physical distress.    Intensity THRR unchanged      Progression   Progression Continue to progress workloads to maintain intensity without signs/symptoms of physical distress.    Average METs 4.22      Resistance Training   Training Prescription Yes    Weight 5 lb    Reps 10-15      Interval Training   Interval Training No      Recumbant Bike   Level 10    Watts 80    Minutes 15     METs 5.28      NuStep   Level 5    Minutes 15    METs 3.8      Home Exercise Plan   Plans to continue exercise at Home (comment)   walk   Frequency Add 2 additional days to program exercise sessions.    Initial Home Exercises Provided 03/09/24      Oxygen   Maintain Oxygen Saturation 88% or higher          Nutrition:  Target Goals: Understanding of nutrition guidelines, daily intake of sodium 1500mg , cholesterol 200mg , calories 30% from fat and 7% or less from saturated fats, daily to have 5 or more servings of fruits and vegetables.  Education: Nutrition 1 -Group instruction provided by verbal, written material, interactive activities, discussions, models, and posters to present general guidelines for heart healthy nutrition including macronutrients, label reading, and promoting whole foods over processed counterparts. Education serves as Pensions consultant of discussion of heart healthy eating for all. Written material provided at  class time.    Education: Nutrition 2 -Group instruction provided by verbal, written material, interactive activities, discussions, models, and posters to present general guidelines for heart healthy nutrition including sodium, cholesterol, and saturated fat. Providing guidance of habit forming to improve blood pressure, cholesterol, and body weight. Written material provided at class time.     Biometrics:  Pre Biometrics - 01/04/24 1357       Pre Biometrics   Height 5' 5.39 (1.661 m)    Weight 171 lb 6.4 oz (77.7 kg)    Waist Circumference 33 inches    Hip Circumference 43.5 inches    Waist to Hip Ratio 0.76 %    BMI (Calculated) 28.18    Single Leg Stand 30 seconds           Nutrition Therapy Plan and Nutrition Goals:  Nutrition Therapy & Goals - 01/06/24 1115       Nutrition Therapy   Diet Cardiac, Low Na    Protein (specify units) 80g    Fiber 25 grams    Whole Grain Foods 3 servings    Saturated Fats 15 max. grams    Fruits and  Vegetables 5 servings/day    Sodium 2 grams      Personal Nutrition Goals   Nutrition Goal Read labels and reduce sodium intake to below 2300mg . Ideally 1500mg  per day.    Personal Goal #2 Eat 15-30gProtein and 30-60gCarbs at each meal.    Personal Goal #3 Reduce saturated fat, less than 12g per day. Replace bad fats for more heart healthy fats.    Comments Patient drinking mostly water but sometimes has a sweet beverage. She reports she has been working on cutting back on the sugary beverages. She reports eating healthy choices mostly, says sometimes she snacks at night on popcorn with butter or cookies. Sometimes she might cook eggs and bacon for breakfast on weekends but not every week. Encouraged her to enjoy comfort foods from time to time, but should not be regular or routine. Reviewed mediterranean diet handout. Educated on types of fats, sources, how to read labels. Provided guidelines on sodium and saturated fat, recommending she stay below 1500mg  sodium and 15gSat fat. Brainstormed some meal and snack ideas with foods she likes and will eat focusing on balanced plates with adequate protein and controlled sodium.      Intervention Plan   Intervention Prescribe, educate and counsel regarding individualized specific dietary modifications aiming towards targeted core components such as weight, hypertension, lipid management, diabetes, heart failure and other comorbidities.;Nutrition handout(s) given to patient.    Expected Outcomes Short Term Goal: Understand basic principles of dietary content, such as calories, fat, sodium, cholesterol and nutrients.;Short Term Goal: A plan has been developed with personal nutrition goals set during dietitian appointment.;Long Term Goal: Adherence to prescribed nutrition plan.          Nutrition Assessments:  MEDIFICTS Score Key: >=70 Need to make dietary changes  40-70 Heart Healthy Diet <= 40 Therapeutic Level Cholesterol Diet  Flowsheet Row Cardiac  Rehab from 01/13/2024 in [facility] Cardiac and Pulmonary Rehab  Picture Your Plate Total Score on Admission 61   Picture Your Plate Scores: <59 Unhealthy dietary pattern with much room for improvement. 41-50 Dietary pattern unlikely to meet recommendations for good health and room for improvement. 51-60 More healthful dietary pattern, with some room for improvement.  >60 Healthy dietary pattern, although there may be some specific behaviors that could be improved.    Nutrition Goals Re-Evaluation:  Nutrition Goals Re-Evaluation     Row Name 03/09/24 0956 06/15/24 1022           Goals   Comment Jaycey reports that she is reading food labels and being mindful of what she is eating. She mostly is trying to eat lean protein and fresh vegetables to make sure she has balanced meals. She is also making an effort to drink more water. Spoke with Gabriel about eatting enought protein and making sure she is reading facts labels to prevent over eating in sodium or saturated fats.      Expected Outcome Short: continue to get comfortable with reading food labels. Long: maintain heart healthy diet long term to help control cardiac risk factors. STG: Read labels and keep sodium below 1500mg  per day. LTG follow a heart healthy lifestyle         Nutrition Goals Discharge (Final Nutrition Goals Re-Evaluation):  Nutrition Goals Re-Evaluation - 06/15/24 1022       Goals   Comment Spoke with Yolandra about eatting enought protein and making sure she is reading facts labels to prevent over eating in sodium or saturated fats.    Expected Outcome STG: Read labels and keep sodium below 1500mg  per day. LTG follow a heart healthy lifestyle          Psychosocial: Target Goals: Acknowledge presence or absence of significant depression and/or stress, maximize coping skills, provide positive support system. Participant is able to verbalize types and ability to use techniques and skills needed for reducing stress and  depression.   Education: Stress, Anxiety, and Depression - Group verbal and visual presentation to define topics covered.  Reviews how body is impacted by stress, anxiety, and depression.  Also discusses healthy ways to reduce stress and to treat/manage anxiety and depression. Written material provided at class time. Flowsheet Row Cardiac Rehab from 03/23/2024 in [facility] Cardiac and Pulmonary Rehab  Date 01/27/24  Educator [facility]  Instruction Review Code 1- [employer] Understanding    Education: Sleep Hygiene -Provides group verbal and written instruction about how sleep can affect your health.  Define sleep hygiene, discuss sleep cycles and impact of sleep habits. Review good sleep hygiene tips.   Initial Review & Psychosocial Screening:  Initial Psych Review & Screening - 12/28/23 1617       Initial Review   Current issues with None Identified      Family Dynamics   Good Support System? Yes    Comments Shauna can look to her friends, significant other and daughter for support. She states no depression or anxiety and does not take any medications for her mood.      Barriers   Psychosocial barriers to participate in program There are no identifiable barriers or psychosocial needs.;The patient should benefit from training in stress management and relaxation.      Screening Interventions   Interventions Encouraged to exercise;Provide feedback about the scores to participant;To provide support and resources with identified psychosocial needs    Expected Outcomes Short Term goal: Utilizing psychosocial counselor, staff and physician to assist with identification of specific Stressors or current issues interfering with healing process. Setting desired goal for each stressor or current issue identified.;Long Term Goal: Stressors or current issues are controlled or eliminated.;Short Term goal: Identification and review with participant of any Quality of Life or Depression concerns found by scoring the  questionnaire.;Long Term goal: The participant improves quality of Life and PHQ9 Scores as seen by post scores and/or verbalization of changes  Quality of Life Scores:   Quality of Life - 01/13/24 1045       Quality of Life   Select Quality of Life      Quality of Life Scores   Health/Function Pre 25.86 %    Socioeconomic Pre 22.56 %    Psych/Spiritual Pre 25.75 %    Family Pre 24.9 %    GLOBAL Pre 24.89 %         Scores of 19 and below usually indicate a poorer quality of life in these areas.  A difference of  2-3 points is a clinically meaningful difference.  A difference of 2-3 points in the total score of the Quality of Life Index has been associated with significant improvement in overall quality of life, self-image, physical symptoms, and general health in studies assessing change in quality of life.  PHQ-9: Review Flowsheet       01/04/2024 04/22/2016  Depression screen PHQ 2/9  Decreased Interest 0 0  Down, Depressed, Hopeless 0 0  PHQ - 2 Score 0 0  Altered sleeping 1 0  Tired, decreased energy 0 1  Change in appetite 0 1  Feeling bad or failure about yourself  0 0  Trouble concentrating 0 0  Moving slowly or fidgety/restless 0 0  Suicidal thoughts 0 0   PHQ-9 Score 1 2  Difficult doing work/chores Not difficult at all Not difficult at all    Details       Data saved with a previous flowsheet row definition        Interpretation of Total Score  Total Score Depression Severity:  1-4 = Minimal depression, 5-9 = Mild depression, 10-14 = Moderate depression, 15-19 = Moderately severe depression, 20-27 = Severe depression   Psychosocial Evaluation and Intervention:  Psychosocial Evaluation - 12/28/23 1618       Psychosocial Evaluation & Interventions   Interventions Encouraged to exercise with the program and follow exercise prescription;Relaxation education;Stress management education    Comments Kleo can look to her friends, significant other  and daughter for support. She states no depression or anxiety and does not take any medications for her mood.    Expected Outcomes Short: Start Heart[facility] to help with mood. Long: Maintain a healthy mental state    Continue Psychosocial Services  Follow up required by staff          Psychosocial Re-Evaluation:  Psychosocial Re-Evaluation     Row Name 03/09/24 1000 06/15/24 1021           Psychosocial Re-Evaluation   Current issues with None Identified Current Sleep Concerns      Comments Jazel reports that she has no concerns with stress or mental health effecting her ability to function in life. She does report that she does not sleep well all the time. She has a good support system and healthy ways of managing stress. Maevis denies any anxiety, depression, or stress at this time. She is struggling to sleep more than 5hours a night. Says this has been a struggle for her for years.      Expected Outcomes Short: continue to attend cardiac rehab for mental health benefits of exercise. Long: maintain good mental health routine. Short: continue to attend cardiac rehab for mental health benefits of exercise. Long: maintain good mental health routine      Interventions Encouraged to attend Cardiac Rehabilitation for the exercise Encouraged to attend Cardiac Rehabilitation for the exercise      Continue Psychosocial Services  Follow up  required by staff Follow up required by staff         Psychosocial Discharge (Final Psychosocial Re-Evaluation):  Psychosocial Re-Evaluation - 06/15/24 1021       Psychosocial Re-Evaluation   Current issues with Current Sleep Concerns    Comments Diala denies any anxiety, depression, or stress at this time. She is struggling to sleep more than 5hours a night. Says this has been a struggle for her for years.    Expected Outcomes Short: continue to attend cardiac rehab for mental health benefits of exercise. Long: maintain good mental health routine     Interventions Encouraged to attend Cardiac Rehabilitation for the exercise    Continue Psychosocial Services  Follow up required by staff          Vocational Rehabilitation: Provide vocational rehab assistance to qualifying candidates.   Vocational Rehab Evaluation & Intervention:   Education: Education Goals: Education classes will be provided on a variety of topics geared toward better understanding of heart health and risk factor modification. Participant will state understanding/return demonstration of topics presented as noted by education test scores.  Learning Barriers/Preferences:  Learning Barriers/Preferences - 12/28/23 1614       Learning Barriers/Preferences   Learning Barriers None    Learning Preferences Group Instruction;Individual Instruction;Pictoral;Skilled Demonstration;Verbal Instruction;Video;Written Material          General Cardiac Education Topics:  AED/CPR: - Group verbal and written instruction with the use of models to demonstrate the basic use of the AED with the basic ABC's of resuscitation.   Test and Procedures: - Group verbal and visual presentation and models provide information about basic cardiac anatomy and function. Reviews the testing methods done to diagnose heart disease and the outcomes of the test results. Describes the treatment choices: Medical Management, Angioplasty, or Coronary Bypass Surgery for treating various heart conditions including Myocardial Infarction, Angina, Valve Disease, and Cardiac Arrhythmias. Written material provided at class time. Flowsheet Row Cardiac Rehab from 03/23/2024 in [facility] Cardiac and Pulmonary Rehab  Date 03/09/24  Educator SB  Instruction Review Code 1- Verbalizes Understanding    Medication Safety: - Group verbal and visual instruction to review commonly prescribed medications for heart and lung disease. Reviews the medication, class of the drug, and side effects. Includes the steps to properly  store meds and maintain the prescription regimen. Written material provided at class time.   Intimacy: - Group verbal instruction through game format to discuss how heart and lung disease can affect sexual intimacy. Written material provided at class time.   Know Your Numbers and Heart Failure: - Group verbal and visual instruction to discuss disease risk factors for cardiac and pulmonary disease and treatment options.  Reviews associated critical values for Overweight/Obesity, Hypertension, Cholesterol, and Diabetes.  Discusses basics of heart failure: signs/symptoms and treatments.  Introduces Heart Failure Zone chart for action plan for heart failure. Written material provided at class time. Flowsheet Row Cardiac Rehab from 03/23/2024 in [facility] Cardiac and Pulmonary Rehab  Date 01/13/24  Educator [facility]  Instruction Review Code 1- Verbalizes Understanding    Infection Prevention: - Provides verbal and written material to individual with discussion of infection control including proper hand washing and proper equipment cleaning during exercise session. Flowsheet Row Cardiac Rehab from 03/23/2024 in [facility] Cardiac and Pulmonary Rehab  Date 01/04/24  Educator MB  Instruction Review Code 1- Verbalizes Understanding    Falls Prevention: - Provides verbal and written material to individual with discussion of falls prevention and safety. Flowsheet Row Cardiac  Rehab from 03/23/2024 in [facility] Cardiac and Pulmonary Rehab  Date 01/04/24  Educator MB  Instruction Review Code 1- Verbalizes Understanding    Other: -Provides group and verbal instruction on various topics (see comments)   Knowledge Questionnaire Score:  Knowledge Questionnaire Score - 01/13/24 1045       Knowledge Questionnaire Score   Pre Score 25/26          Core Components/Risk Factors/Patient Goals at Admission:  Personal Goals and Risk Factors at Admission - 01/04/24 1359       Core Components/Risk Factors/Patient Goals on  Admission    Weight Management Yes;Weight Loss    Intervention Weight Management: Develop a combined nutrition and exercise program designed to reach desired caloric intake, while maintaining appropriate intake of nutrient and fiber, sodium and fats, and appropriate energy expenditure required for the weight goal.;Weight Management: Provide education and appropriate resources to help participant work on and attain dietary goals.;Weight Management/Obesity: Establish reasonable short term and long term weight goals.    Admit Weight 171 lb 6.4 oz (77.7 kg)    Goal Weight: Short Term 158 lb 3.2 oz (71.8 kg)    Goal Weight: Long Term 145 lb (65.8 kg)    Expected Outcomes Short Term: Continue to assess and modify interventions until short term weight is achieved;Long Term: Adherence to nutrition and physical activity/exercise program aimed toward attainment of established weight goal;Understanding recommendations for meals to include 15-35% energy as protein, 25-35% energy from fat, 35-60% energy from carbohydrates, less than 200mg  of dietary cholesterol, 20-35 gm of total fiber daily;Understanding of distribution of calorie intake throughout the day with the consumption of 4-5 meals/snacks;Weight Loss: Understanding of general recommendations for a balanced deficit meal plan, which promotes 1-2 lb weight loss per week and includes a negative energy balance of [phone] kcal/d    Heart Failure Yes    Intervention Provide a combined exercise and nutrition program that is supplemented with education, support and counseling about heart failure. Directed toward relieving symptoms such as shortness of breath, decreased exercise tolerance, and extremity edema.    Expected Outcomes Improve functional capacity of life;Short term: Attendance in program 2-3 days a week with increased exercise capacity. Reported lower sodium intake. Reported increased fruit and vegetable intake. Reports medication compliance.;Short term:  Daily weights obtained and reported for increase. Utilizing diuretic protocols set by physician.;Long term: Adoption of self-care skills and reduction of barriers for early signs and symptoms recognition and intervention leading to self-care maintenance.    Hypertension Yes    Intervention Provide education on lifestyle modifcations including regular physical activity/exercise, weight management, moderate sodium restriction and increased consumption of fresh fruit, vegetables, and low fat dairy, alcohol moderation, and smoking cessation.;Monitor prescription use compliance.    Expected Outcomes Short Term: Continued assessment and intervention until BP is < 140/40mm HG in hypertensive participants. < 130/47mm HG in hypertensive participants with diabetes, heart failure or chronic kidney disease.;Long Term: Maintenance of blood pressure at goal levels.    Lipids Yes    Intervention Provide education and support for participant on nutrition & aerobic/resistive exercise along with prescribed medications to achieve LDL 70mg , HDL >40mg .    Expected Outcomes Short Term: Participant states understanding of desired cholesterol values and is compliant with medications prescribed. Participant is following exercise prescription and nutrition guidelines.;Long Term: Cholesterol controlled with medications as prescribed, with individualized exercise RX and with personalized nutrition plan. Value goals: LDL < 70mg , HDL > 40 mg.  Education:Diabetes - Individual verbal and written instruction to review signs/symptoms of diabetes, desired ranges of glucose level fasting, after meals and with exercise. Acknowledge that pre and post exercise glucose checks will be done for 3 sessions at entry of program.   Core Components/Risk Factors/Patient Goals Review:   Goals and Risk Factor Review     Row Name 03/09/24 0958 06/15/24 1023           Core Components/Risk Factors/Patient Goals Review   Personal Goals  Review Weight Management/Obesity;Heart Failure;Lipids Hypertension;Weight Management/Obesity      Review Alonna stated that her weight has been steady. She still wants to work towards her weight loss goal, but the importance of weighing daily and monitoring for symptoms of heart failure was reviewed. She continues to take all her cholesterol medication as well and follows up with her doctor for regular appointments and lab work. Katriel reports that she checks her BP at home. Takes her meds as prescribed. Says she noticed her weight fluctuating some lately but thinks it is likely due to water weight.      Expected Outcomes Short: weight daily and monitor for signs of heart failure. Long: continue to control cardiac risk factors. Short: weight daily and monitor for signs of heart failure. Long: continue to control cardiac risk factors.         Core Components/Risk Factors/Patient Goals at Discharge (Final Review):   Goals and Risk Factor Review - 06/15/24 1023       Core Components/Risk Factors/Patient Goals Review   Personal Goals Review Hypertension;Weight Management/Obesity    Review Alvilda reports that she checks her BP at home. Takes her meds as prescribed. Says she noticed her weight fluctuating some lately but thinks it is likely due to water weight.    Expected Outcomes Short: weight daily and monitor for signs of heart failure. Long: continue to control cardiac risk factors.          ITP Comments:  ITP Comments     Row Name 12/28/23 1620 01/04/24 1350 01/06/24 1045 01/27/24 1317 02/24/24 1111   ITP Comments Virtual Visit completed. Patient informed on EP and RD appointment and 6 Minute walk test. Patient also informed of patient health questionnaires on My Chart. Patient Verbalizes understanding. Visit diagnosis can be found in [facility] 11/20/2023. Completed and gym orientation for cardiac rehab. Initial ITP created and sent for review to Dr. Oneil Pinal, Medical Director. First full day of  exercise!  Patient was oriented to gym and equipment including functions, settings, policies, and procedures.  Patient's individual exercise prescription and treatment plan were reviewed.  All starting workloads were established based on the results of the 6 minute walk test done at initial orientation visit.  The plan for exercise progression was also introduced and progression will be customized based on patient's performance and goals. 30 Day review completed. Medical Director ITP review done, changes made as directed, and signed approval by Medical Director. New to program. 30 Day review completed. Medical Director ITP review done, changes made as directed, and signed approval by Medical Director.    Row Name 03/23/24 1120 04/20/24 1017 05/18/24 1053 06/15/24 1126     ITP Comments 30 Day review completed. Medical Director ITP review done, changes made as directed, and signed approval by Medical Director. 30 Day review completed. Medical Director ITP review done, changes made as directed, and signed approval by Medical Director. 30 Day review completed. Medical Director ITP review done, changes made as directed, and signed  approval by Medical Director. 30 Day review completed. Medical Director ITP review done, changes made as directed, and signed approval by Medical Director.       Comments: 30 day review

## 2024-06-16 ENCOUNTER — Encounter: Admitting: Emergency Medicine

## 2024-06-16 DIAGNOSIS — Z955 Presence of coronary angioplasty implant and graft: Secondary | ICD-10-CM

## 2024-06-16 NOTE — Progress Notes (Signed)
 Daily Session Note  Patient Details  Name: MAIANA HENNIGAN MRN: [id number] Date of Birth: [birth year] Referring Provider:   Flowsheet Row Cardiac Rehab from 01/04/2024 in [facility] Cardiac and Pulmonary Rehab  Referring Provider Clarinda Sharper MD    Encounter Date: 06/16/2024  Check In:  Session Check In - 06/16/24 1725       Check-In   Supervising physician immediately available to respond to emergencies See telemetry face sheet for immediately available ER MD    Location [facility]-Cardiac & Pulmonary Rehab    Staff Present Maxon Conetta BS, Exercise Physiologist;Joseph Hood RCP,RRT,BSRT;Shaunika Italiano RN,BSN;Margaret Best, MS, Exercise Physiologist    Virtual Visit No    Medication changes reported     No    Fall or balance concerns reported    No    Warm-up and Cool-down Performed on first and last piece of equipment    Resistance Training Performed Yes    VAD Patient? No    PAD/SET Patient? No      Pain Assessment   Currently in Pain? No/denies             Social History   Tobacco Use  Smoking Status Never  Smokeless Tobacco Never    Goals Met:  Independence with exercise equipment Exercise tolerated well No report of concerns or symptoms today Strength training completed today  Goals Unmet:  Not Applicable  Comments: Pt able to follow exercise prescription today without complaint.  Will continue to monitor for progression.    Dr. Oneil Pinal is Medical Director for [facility] Cardiac Rehabilitation.  Dr. Fuad Aleskerov is Medical Director for [facility] Pulmonary Rehabilitation.

## 2024-06-22 ENCOUNTER — Encounter: Attending: Cardiovascular Disease

## 2024-06-22 VITALS — Ht 65.39 in | Wt 169.0 lb

## 2024-06-22 DIAGNOSIS — Z955 Presence of coronary angioplasty implant and graft: Secondary | ICD-10-CM | POA: Diagnosis present

## 2024-06-22 NOTE — Patient Instructions (Signed)
 Discharge Patient Instructions  Patient Details  Name: Sara Hodges MRN: [id number] Date of Birth: 07-10-1951 Referring Provider:  Auston Reyes BIRCH, MD   Number of Visits: 40  Reason for Discharge:  Patient reached a stable level of exercise. Patient independent in their exercise. Patient has met program and personal goals.  Diagnosis:  Status post coronary artery stent placement  Initial Exercise Prescription:  Initial Exercise Prescription - 01/04/24 1300       Date of Initial Exercise RX and Referring Provider   Date 01/04/24    Referring Provider Clarinda Sharper MD      Oxygen   Maintain Oxygen Saturation 88% or higher      Recumbant Bike   Level 2    RPM 50    Watts 20    Minutes 15    METs 2.83      NuStep   Level 2    SPM 80    Minutes 15    METs 2.83      REL-XR   Level 1    Speed 50    Minutes 15    METs 2.83      T5 Nustep   Level 2    SPM 80    Minutes 15    METs 2.83      Track   Laps 33    Minutes 15    METs 2.79      Prescription Details   Frequency (times per week) 2    Duration Progress to 30 minutes of continuous aerobic without signs/symptoms of physical distress      Intensity   THRR 40-80% of Max Heartrate 100-131    Ratings of Perceived Exertion 11-13    Perceived Dyspnea 0-4      Progression   Progression Continue to progress workloads to maintain intensity without signs/symptoms of physical distress.      Resistance Training   Training Prescription Yes    Weight 4 lb    Reps 10-15          Discharge Exercise Prescription (Final Exercise Prescription Changes):  Exercise Prescription Changes - 06/16/24 0900       Response to Exercise   Blood Pressure (Admit) 120/72    Blood Pressure (Exit) 122/80    Heart Rate (Admit) 76 bpm    Heart Rate (Exercise) 115 bpm    Heart Rate (Exit) 53 bpm    Rating of Perceived Exertion (Exercise) 11    Symptoms none    Duration Continue with 30 min of aerobic exercise  without signs/symptoms of physical distress.    Intensity THRR unchanged      Progression   Progression Continue to progress workloads to maintain intensity without signs/symptoms of physical distress.    Average METs 5.13      Resistance Training   Training Prescription Yes    Weight 5 lb    Reps 10-15      Interval Training   Interval Training No      Recumbant Bike   Level 3    Watts 80    Minutes 15    METs 5.26      NuStep   Level 4    Minutes 15    METs 5      Home Exercise Plan   Plans to continue exercise at Home (comment)   walk   Frequency Add 2 additional days to program exercise sessions.    Initial Home Exercises Provided 03/09/24  Oxygen   Maintain Oxygen Saturation 88% or higher          Functional Capacity:  6 Minute Walk     Row Name 01/04/24 1350 06/22/24 0950       6 Minute Walk   Phase Initial Discharge    Distance 1390 feet 1645 feet    Distance % Change -- 18.35 %    Distance Feet Change -- 255 ft    Walk Time 6 minutes 6 minutes    # of Rest Breaks 0 0    MPH 2.63 3.12    METS 2.83 3.52    RPE 9 12    Perceived Dyspnea  0 0    VO2 Peak 9.89 12.31    Symptoms No Yes (comment)    Comments -- Nausea/got hot at end of test    Resting HR 69 bpm 69 bpm    Resting BP 108/70 130/70    Resting Oxygen Saturation  99 % 100 %    Exercise Oxygen Saturation  during 6 min walk 100 % 100 %    Max Ex. HR 90 bpm 105 bpm    Max Ex. BP 146/68 154/64    2 Minute Post BP 118/66 126/66      Nutrition & Weight - Outcomes:  Pre Biometrics - 01/04/24 1357       Pre Biometrics   Height 5' 5.39 (1.661 m)    Weight 171 lb 6.4 oz (77.7 kg)    Waist Circumference 33 inches    Hip Circumference 43.5 inches    Waist to Hip Ratio 0.76 %    BMI (Calculated) 28.18    Single Leg Stand 30 seconds          Post Biometrics - 06/22/24 0956        Post  Biometrics   Height 5' 5.39 (1.661 m)    Weight 169 lb (76.7 kg)    Waist Circumference  36 inches    Hip Circumference 43.2 inches    Waist to Hip Ratio 0.83 %    BMI (Calculated) 27.79    Single Leg Stand 30 seconds          Nutrition:  Nutrition Therapy & Goals - 01/06/24 1115       Nutrition Therapy   Diet Cardiac, Low Na    Protein (specify units) 80g    Fiber 25 grams    Whole Grain Foods 3 servings    Saturated Fats 15 max. grams    Fruits and Vegetables 5 servings/day    Sodium 2 grams      Personal Nutrition Goals   Nutrition Goal Read labels and reduce sodium intake to below 2300mg . Ideally 1500mg  per day.    Personal Goal #2 Eat 15-30gProtein and 30-60gCarbs at each meal.    Personal Goal #3 Reduce saturated fat, less than 12g per day. Replace bad fats for more heart healthy fats.    Comments Patient drinking mostly water but sometimes has a sweet beverage. She reports she has been working on cutting back on the sugary beverages. She reports eating healthy choices mostly, says sometimes she snacks at night on popcorn with butter or cookies. Sometimes she might cook eggs and bacon for breakfast on weekends but not every week. Encouraged her to enjoy comfort foods from time to time, but should not be regular or routine. Reviewed mediterranean diet handout. Educated on types of fats, sources, how to read labels. Provided guidelines on sodium and saturated fat,  recommending she stay below 1500mg  sodium and 15gSat fat. Brainstormed some meal and snack ideas with foods she likes and will eat focusing on balanced plates with adequate protein and controlled sodium.      Intervention Plan   Intervention Prescribe, educate and counsel regarding individualized specific dietary modifications aiming towards targeted core components such as weight, hypertension, lipid management, diabetes, heart failure and other comorbidities.;Nutrition handout(s) given to patient.    Expected Outcomes Short Term Goal: Understand basic principles of dietary content, such as calories, fat,  sodium, cholesterol and nutrients.;Short Term Goal: A plan has been developed with personal nutrition goals set during dietitian appointment.;Long Term Goal: Adherence to prescribed nutrition plan.

## 2024-06-22 NOTE — Progress Notes (Signed)
 Daily Session Note  Patient Details  Name: Sara Hodges MRN: [id number] Date of Birth: [birth year] Referring Provider:   Flowsheet Row Cardiac Rehab from 01/04/2024 in [facility] Cardiac and Pulmonary Rehab  Referring Provider Clarinda Sharper MD    Encounter Date: 06/22/2024  Check In:  Session Check In - 06/22/24 0920       Check-In   Supervising physician immediately available to respond to emergencies See telemetry face sheet for immediately available ER MD    Location [facility]-Cardiac & Pulmonary Rehab    Staff Present Burnard Davenport [facility] Peggi, RN, DNP, NE-BC;Joseph [facility] RN,BSN;Margaret Best, MS, Exercise Physiologist    Virtual Visit No    Medication changes reported     No    Fall or balance concerns reported    No    Warm-up and Cool-down Performed on first and last piece of equipment    Resistance Training Performed Yes    VAD Patient? No    PAD/SET Patient? No      Pain Assessment   Currently in Pain? No/denies             Social History   Tobacco Use  Smoking Status Never  Smokeless Tobacco Never    Goals Met:  Independence with exercise equipment Exercise tolerated well No report of concerns or symptoms today Strength training completed today  Goals Unmet:  Not Applicable  Comments: Pt able to follow exercise prescription today without complaint.  Will continue to monitor for progression.    Dr. Oneil Pinal is Medical Director for [facility] Cardiac Rehabilitation.  Dr. Fuad Aleskerov is Medical Director for [facility] Pulmonary Rehabilitation.

## 2024-06-23 ENCOUNTER — Encounter: Admitting: Emergency Medicine

## 2024-06-23 DIAGNOSIS — Z955 Presence of coronary angioplasty implant and graft: Secondary | ICD-10-CM

## 2024-06-23 NOTE — Progress Notes (Signed)
 Daily Session Note  Patient Details  Name: Sara Hodges MRN: [id number] Date of Birth: [birth year] Referring Provider:   Flowsheet Row Cardiac Rehab from 01/04/2024 in [facility] Cardiac and Pulmonary Rehab  Referring Provider Clarinda Sharper MD    Encounter Date: 06/23/2024  Check In:  Session Check In - 06/23/24 0741       Check-In   Supervising physician immediately available to respond to emergencies See telemetry face sheet for immediately available ER MD    Location [facility]-Cardiac & Pulmonary Rehab    Staff Present Rollene Paterson, MS, Exercise Physiologist;Serge Main RN,BSN;Joseph Rolinda RCP,RRT,BSRT    Virtual Visit No    Medication changes reported     No    Fall or balance concerns reported    No    Warm-up and Cool-down Performed on first and last piece of equipment    Resistance Training Performed Yes    VAD Patient? No    PAD/SET Patient? No      Pain Assessment   Currently in Pain? No/denies             Social History   Tobacco Use  Smoking Status Never  Smokeless Tobacco Never    Goals Met:  Independence with exercise equipment Exercise tolerated well No report of concerns or symptoms today Strength training completed today  Goals Unmet:  Not Applicable  Comments: Pt able to follow exercise prescription today without complaint.  Will continue to monitor for progression.    Dr. Oneil Pinal is Medical Director for [facility] Cardiac Rehabilitation.  Dr. Fuad Aleskerov is Medical Director for [facility] Pulmonary Rehabilitation.

## 2024-06-29 ENCOUNTER — Encounter

## 2024-06-29 DIAGNOSIS — Z955 Presence of coronary angioplasty implant and graft: Secondary | ICD-10-CM | POA: Diagnosis not present

## 2024-06-29 NOTE — Progress Notes (Signed)
 Cardiac Individual Treatment Plan  Patient Details  Name: Sara Hodges MRN: [id number] Date of Birth: 04-09-1951 Referring Provider:   Flowsheet Row Cardiac Rehab from 01/04/2024 in [facility] Cardiac and Pulmonary Rehab  Referring Provider Clarinda Sharper MD    Initial Encounter Date:  Flowsheet Row Cardiac Rehab from 01/04/2024 in [facility] Cardiac and Pulmonary Rehab  Date 01/04/24    Visit Diagnosis: Status post coronary artery stent placement  Patient's Home Medications on Admission:  Current Outpatient Medications:    acetaminophen  (TYLENOL ) 500 MG tablet, Take 1,000 mg by mouth every 6 (six) hours as needed for moderate pain (pain score 4-6)., Disp: , Rfl:    aspirin  EC 81 MG tablet, Take 81 mg by mouth daily. , Disp: , Rfl:    clopidogrel (PLAVIX) 75 MG tablet, Take 75 mg by mouth daily., Disp: , Rfl:    ENTRESTO  49-51 MG, Take 1 tablet by mouth 2 (two) times daily., Disp: , Rfl:    Evolocumab (REPATHA SURECLICK) 140 MG/ML SOAJ, Inject 140 mg into the skin every 14 (fourteen) days., Disp: , Rfl:    folic acid  (FOLVITE ) 1 MG tablet, Take 1 mg by mouth daily., Disp: , Rfl:    furosemide  (LASIX ) 20 MG tablet, Take 20 mg by mouth daily as needed for fluid., Disp: , Rfl:    loratadine (CLARITIN) 10 MG tablet, Take 10 mg by mouth daily as needed for allergies., Disp: , Rfl:    metoprolol succinate (TOPROL-XL) 25 MG 24 hr tablet, Take 12.5 mg by mouth daily., Disp: , Rfl:    nitroGLYCERIN (NITROSTAT) 0.4 MG SL tablet, Place under the tongue., Disp: , Rfl:    oxyCODONE  (ROXICODONE ) 5 MG immediate release tablet, Take 1 tablet (5 mg total) by mouth every 8 (eight) hours as needed for severe pain (pain score 7-10) or moderate pain (pain score 4-6)., Disp: 6 tablet, Rfl: 0   pantoprazole (PROTONIX) 40 MG tablet, Take 40 mg by mouth daily., Disp: , Rfl:    Vitamin D , Ergocalciferol , (DRISDOL ) 50000 UNITS CAPS capsule, Take 50,000 Units by mouth every 7 (seven) days. 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   a.) MRI brain 07/13/2014: acute/subacute infarct in the LEFT basal ganglia involving the putamen and body of the caudate   IPMN (intraductal papillary mucinous neoplasm)    a.) single foci of high-grade dysplasia; s/p pancreaticoduodenectomy 12/2022   LBBB (left bundle branch block)    Long term current use of clopidogrel    Long-term use of aspirin  therapy    Mobitz type 1 second degree atrioventricular block    a.) s/p PPM 09/09/2019   NSVT (nonsustained ventricular tachycardia) (HCC)    Osteoporosis    PAF (paroxysmal atrial fibrillation) (HCC)    a.) CHA2DS2VASc = 7 (age, sex, HFpEF, HTN, CVA x  2, vascular disease) as of 06/02/2024; b.) rate/rhythm maintained on metoprolol succinate; no chronic OAC (is on DAPT therapy)   Pneumonia    Presence of permanent cardiac pacemaker 09/09/2019   a.) [employer] [phone] dual chamber pacemaker   RA (rheumatoid arthritis) (HCC)    Statin myopathy    Thoracic spondylosis    Vitamin D  deficiency     Tobacco Use: Social History   Tobacco Use  Smoking Status Never  Smokeless Tobacco Never    Labs: Review Flowsheet        No data to display           Exercise Target Goals: Exercise Program Goal: Individual exercise prescription set using results from initial 6 min walk test and THRR while considering  patient's activity barriers and  safety.   Exercise Prescription Goal: Initial exercise prescription builds to 30-45 minutes a day of aerobic activity, 2-3 days per week.  Home exercise guidelines will be given to patient during program as part of exercise prescription that the participant will acknowledge.   Education: Aerobic Exercise: - Group verbal and visual presentation on the components of exercise prescription. Introduces F.I.T.T principle from ACSM for exercise prescriptions.  Reviews F.I.T.T. principles of aerobic exercise including progression. Written material provided at class time.   Education: Resistance Exercise: - Group verbal and visual presentation on the components of exercise prescription. Introduces F.I.T.T principle from ACSM for exercise prescriptions  Reviews F.I.T.T. principles of resistance exercise including progression. Written material provided at class time. Flowsheet Row Cardiac Rehab from 06/29/2024 in [facility] Cardiac and Pulmonary Rehab  Date 06/29/24  Educator nt  Instruction Review Code 1- TEFL teacher Understanding     Education: Exercise & Equipment Safety: - Individual verbal instruction and demonstration of equipment use and safety with use of the equipment. Flowsheet Row Cardiac Rehab from 06/29/2024 in [facility] Cardiac and Pulmonary Rehab  Date 01/04/24  Educator MB  Instruction Review Code 1- Verbalizes Understanding    Education: Exercise Physiology & General Exercise Guidelines: - Group verbal and written instruction with models to review the exercise physiology of the cardiovascular system and associated critical values. Provides general exercise guidelines with specific guidelines to those with heart or lung disease. Written material provided at class time. Flowsheet Row Cardiac Rehab from 06/29/2024 in [facility] Cardiac and Pulmonary Rehab  Date 06/22/24  Educator nt  Instruction Review Code 1- TEFL teacher Understanding    Education: Flexibility, Balance, Mind/Body Relaxation: - Group  verbal and visual presentation with interactive activity on the components of exercise prescription. Introduces F.I.T.T principle from ACSM for exercise prescriptions. Reviews F.I.T.T. principles of flexibility and balance exercise training including progression. Also discusses the mind body connection.  Reviews various relaxation techniques to help reduce and manage stress (i.e. Deep breathing, progressive muscle relaxation, and visualization). Balance handout provided to take home. Written material provided at class time. Flowsheet Row Cardiac Rehab from 06/29/2024 in [facility] Cardiac and Pulmonary Rehab  Date 06/29/24  Educator nt  Instruction Review Code 1- Verbalizes Understanding    Activity Barriers & Risk Stratification:  Activity Barriers & Cardiac Risk Stratification - 01/04/24 1352       Activity Barriers & Cardiac Risk Stratification   Activity Barriers Back Problems;Other (comment)    Comments L leg aches (mainly at night), occasional low and mid back pain due to degenerative disc disease    Cardiac Risk Stratification Moderate          6 Minute Walk:  6 Minute Walk  Row Name 01/04/24 1350 06/22/24 0950       6 Minute Walk   Phase Initial Discharge    Distance 1390 feet 1645 feet    Distance % Change -- 18.35 %    Distance Feet Change -- 255 ft    Walk Time 6 minutes 6 minutes    # of Rest Breaks 0 0    MPH 2.63 3.12    METS 2.83 3.52    RPE 9 12    Perceived Dyspnea  0 0    VO2 Peak 9.89 12.31    Symptoms No Yes (comment)    Comments -- Nausea/got hot at end of test    Resting HR 69 bpm 69 bpm    Resting BP 108/70 130/70    Resting Oxygen Saturation  99 % 100 %    Exercise Oxygen Saturation  during 6 min walk 100 % 100 %    Max Ex. HR 90 bpm 105 bpm    Max Ex. BP 146/68 154/64    2 Minute Post BP 118/66 126/66       Oxygen Initial Assessment:   Oxygen Re-Evaluation:   Oxygen Discharge (Final Oxygen Re-Evaluation):   Initial Exercise  Prescription:  Initial Exercise Prescription - 01/04/24 1300       Date of Initial Exercise RX and Referring Provider   Date 01/04/24    Referring Provider Clarinda Sharper MD      Oxygen   Maintain Oxygen Saturation 88% or higher      Recumbant Bike   Level 2    RPM 50    Watts 20    Minutes 15    METs 2.83      NuStep   Level 2    SPM 80    Minutes 15    METs 2.83      REL-XR   Level 1    Speed 50    Minutes 15    METs 2.83      T5 Nustep   Level 2    SPM 80    Minutes 15    METs 2.83      Track   Laps 33    Minutes 15    METs 2.79      Prescription Details   Frequency (times per week) 2    Duration Progress to 30 minutes of continuous aerobic without signs/symptoms of physical distress      Intensity   THRR 40-80% of Max Heartrate 100-131    Ratings of Perceived Exertion 11-13    Perceived Dyspnea 0-4      Progression   Progression Continue to progress workloads to maintain intensity without signs/symptoms of physical distress.      Resistance Training   Training Prescription Yes    Weight 4 lb    Reps 10-15          Perform Capillary Blood Glucose checks as needed.  Exercise Prescription Changes:   Exercise Prescription Changes     Row Name 01/04/24 1300 01/14/24 1800 01/26/24 1500 02/11/24 1800 02/25/24 1600     Response to Exercise   Blood Pressure (Admit) 108/70 102/58 102/60 134/62 122/60   Blood Pressure (Exercise) 146/68 130/60 148/62 144/58 122/58   Blood Pressure (Exit) 118/66 108/60 110/62 118/70 110/62   Heart Rate (Admit) 69 bpm 82 bpm 77 bpm 72 bpm 79 bpm   Heart Rate (Exercise) 90 bpm 90 bpm 116 bpm 116 bpm 115 bpm   Heart Rate (Exit) 69  bpm 74 bpm 93 bpm 85 bpm 89 bpm   Oxygen Saturation (Admit) 99 % -- -- -- --   Oxygen Saturation (Exercise) 100 % -- -- -- --   Oxygen Saturation (Exit) 100 % -- -- -- --   Rating of Perceived Exertion (Exercise) 9 11 13 13 14    Perceived Dyspnea (Exercise) 0 -- -- -- --   Symptoms  none none none none none   Comments results 1st full day of exercise -- -- --   Duration Progress to 30 minutes of  aerobic without signs/symptoms of physical distress Continue with 30 min of aerobic exercise without signs/symptoms of physical distress. Continue with 30 min of aerobic exercise without signs/symptoms of physical distress. Continue with 30 min of aerobic exercise without signs/symptoms of physical distress. Continue with 30 min of aerobic exercise without signs/symptoms of physical distress.   Intensity THRR New THRR unchanged THRR unchanged THRR unchanged THRR unchanged     Progression   Progression Continue to progress workloads to maintain intensity without signs/symptoms of physical distress. Continue to progress workloads to maintain intensity without signs/symptoms of physical distress. Continue to progress workloads to maintain intensity without signs/symptoms of physical distress. Continue to progress workloads to maintain intensity without signs/symptoms of physical distress. Continue to progress workloads to maintain intensity without signs/symptoms of physical distress.   Average METs 2.83 3.38 3.07 3.17 3.38     Resistance Training   Training Prescription -- Yes Yes Yes Yes   Weight -- 4 lb 5 lb 5 lb 5 lb   Reps -- 10-15 10-15 10-15 10-15     Interval Training   Interval Training -- No No No No     Treadmill   MPH -- -- 2.5 2.6 --   Grade -- -- 0 0.5 --   Minutes -- -- 15 15 --   METs -- -- 2.91 3.17 --     Recumbant Bike   Level -- -- 5 5 8    Watts -- -- 39 20 57   Minutes -- -- 15 15 15    METs -- -- 2.81 2.8 4.28     NuStep   Level -- 2 4 4 4    Minutes -- 15 15 15 15    METs -- 5 4 4.4 3.9     REL-XR   Level -- -- 6 -- --   Minutes -- -- 15 -- --   METs -- -- 4.5 -- --     T5 Nustep   Level -- -- 3 3 2    Minutes -- -- 15 15 15    METs -- -- 2.2 2.3 3     Track   Laps -- 14 14 -- --   Minutes -- 15 15 -- --   METs -- 1.76 1.76 -- --      Oxygen   Maintain Oxygen Saturation -- 88% or higher 88% or higher 88% or higher 88% or higher    Row Name 03/09/24 0900 03/10/24 0900 03/24/24 1500 04/06/24 0900 05/04/24 0900     Response to Exercise   Blood Pressure (Admit) -- 116/54 108/62 110/58 114/52   Blood Pressure (Exit) -- 106/58 96/62 112/70 110/58   Heart Rate (Admit) -- 74 bpm 68 bpm 74 bpm 81 bpm   Heart Rate (Exercise) -- 115 bpm 117 bpm 117 bpm 110 bpm   Heart Rate (Exit) 89 bpm 88 bpm 94 bpm 77 bpm 77 bpm   Rating of Perceived Exertion (Exercise) -- 13 15  12 13   Symptoms -- none none none none   Duration Continue with 30 min of aerobic exercise without signs/symptoms of physical distress. Continue with 30 min of aerobic exercise without signs/symptoms of physical distress. Continue with 30 min of aerobic exercise without signs/symptoms of physical distress. Continue with 30 min of aerobic exercise without signs/symptoms of physical distress. Continue with 30 min of aerobic exercise without signs/symptoms of physical distress.   Intensity THRR unchanged THRR unchanged THRR unchanged THRR unchanged THRR unchanged     Progression   Progression Continue to progress workloads to maintain intensity without signs/symptoms of physical distress. Continue to progress workloads to maintain intensity without signs/symptoms of physical distress. Continue to progress workloads to maintain intensity without signs/symptoms of physical distress. Continue to progress workloads to maintain intensity without signs/symptoms of physical distress. Continue to progress workloads to maintain intensity without signs/symptoms of physical distress.   Average METs 3.38 3.61 3.42 4.73 2.78     Resistance Training   Training Prescription Yes Yes Yes Yes Yes   Weight 5 lb 5 lb 5 lb 5 lb 5 lb   Reps 10-15 10-15 10-15 10-15 10-15     Interval Training   Interval Training No No No No No     Treadmill   MPH -- -- 2.6 2.6 --   Grade -- -- 2.5 2.6 --    Minutes -- -- 15 15 --   METs -- -- 3.89 3.89 --     Recumbant Bike   Level 8 8 8  -- 3   Watts 57 54 51 -- 26   Minutes 15 15 15  -- 15   METs 4.28 4.32 4.28 -- 3.07     NuStep   Level 4 4 3  -- --   Minutes 15 15 13  -- --   METs 3.9 4.6 2 -- --     REL-XR   Level -- -- -- 9 --   Minutes -- -- -- 15 --   METs -- -- -- 6.9 --     T5 Nustep   Level 2 4 4  -- 5   Minutes 15 15 15  -- 15   METs 3 2.2 2.5 -- 2.5     Biostep-RELP   Level -- -- 2 -- --   Minutes -- -- 15 -- --   METs -- -- 2 -- --     Rower   Level -- -- -- 10 --   Minutes -- -- -- 15 --   METs -- -- -- 30 --     Home Exercise Plan   Plans to continue exercise at Home (comment)  walk Home (comment)  walk Home (comment)  walk Home (comment)  walk Home (comment)  walk   Frequency Add 2 additional days to program exercise sessions. Add 2 additional days to program exercise sessions. Add 2 additional days to program exercise sessions. Add 2 additional days to program exercise sessions. Add 2 additional days to program exercise sessions.   Initial Home Exercises Provided 03/09/24 03/09/24 03/09/24 03/09/24 03/09/24     Oxygen   Maintain Oxygen Saturation 88% or higher 88% or higher 88% or higher 88% or higher 88% or higher    Row Name 05/31/24 1400 06/16/24 0900           Response to Exercise   Blood Pressure (Admit) 106/72 120/72      Blood Pressure (Exit) 100/80 122/80      Heart Rate (Admit) 77 bpm 76 bpm  Heart Rate (Exercise) 111 bpm 115 bpm      Heart Rate (Exit) 89 bpm 53 bpm      Rating of Perceived Exertion (Exercise) 14 11      Symptoms none none      Duration Continue with 30 min of aerobic exercise without signs/symptoms of physical distress. Continue with 30 min of aerobic exercise without signs/symptoms of physical distress.      Intensity THRR unchanged THRR unchanged        Progression   Progression Continue to progress workloads to maintain intensity without signs/symptoms of physical  distress. Continue to progress workloads to maintain intensity without signs/symptoms of physical distress.      Average METs 4.22 5.13        Resistance Training   Training Prescription Yes Yes      Weight 5 lb 5 lb      Reps 10-15 10-15        Interval Training   Interval Training No No        Recumbant Bike   Level 10 3      Watts 80 80      Minutes 15 15      METs 5.28 5.26        NuStep   Level 5 4      Minutes 15 15      METs 3.8 5        Home Exercise Plan   Plans to continue exercise at Home (comment)  walk Home (comment)  walk      Frequency Add 2 additional days to program exercise sessions. Add 2 additional days to program exercise sessions.      Initial Home Exercises Provided 03/09/24 03/09/24        Oxygen   Maintain Oxygen Saturation 88% or higher 88% or higher         Exercise Comments:   Exercise Comments     Row Name 01/06/24 1045 06/29/24 0940         Exercise Comments First full day of exercise!  Patient was oriented to gym and equipment including functions, settings, policies, and procedures.  Patient's individual exercise prescription and treatment plan were reviewed.  All starting workloads were established based on the results of the 6 minute walk test done at initial orientation visit.  The plan for exercise progression was also introduced and progression will be customized based on patient's performance and goals. Floye graduated today from  rehab with 36 sessions completed.  Details of the patient's exercise prescription and what She needs to do in order to continue the prescription and progress were discussed with patient.  Patient was given a copy of prescription and goals.  Patient verbalized understanding. Rejeana plans to continue to exercise by walking at home.         Exercise Goals and Review:   Exercise Goals     Row Name 01/04/24 1356             Exercise Goals   Increase Physical Activity Yes       Intervention Provide  advice, education, support and counseling about physical activity/exercise needs.;Develop an individualized exercise prescription for aerobic and resistive training based on initial evaluation findings, risk stratification, comorbidities and participant's personal goals.       Expected Outcomes Short Term: Attend rehab on a regular basis to increase amount of physical activity.;Long Term: Add in home exercise to make exercise part of routine and to increase amount of  physical activity.;Long Term: Exercising regularly at least 3-5 days a week.       Increase Strength and Stamina Yes       Intervention Provide advice, education, support and counseling about physical activity/exercise needs.;Develop an individualized exercise prescription for aerobic and resistive training based on initial evaluation findings, risk stratification, comorbidities and participant's personal goals.       Expected Outcomes Short Term: Increase workloads from initial exercise prescription for resistance, speed, and METs.;Short Term: Perform resistance training exercises routinely during rehab and add in resistance training at home;Long Term: Improve cardiorespiratory fitness, muscular endurance and strength as measured by increased METs and functional capacity ( )       Able to understand and use rate of perceived exertion (RPE) scale Yes       Intervention Provide education and explanation on how to use RPE scale       Expected Outcomes Short Term: Able to use RPE daily in rehab to express subjective intensity level;Long Term:  Able to use RPE to guide intensity level when exercising independently       Able to understand and use Dyspnea scale Yes       Intervention Provide education and explanation on how to use Dyspnea scale       Expected Outcomes Short Term: Able to use Dyspnea scale daily in rehab to express subjective sense of shortness of breath during exertion;Long Term: Able to use Dyspnea scale to guide intensity  level when exercising independently       Knowledge and understanding of Target Heart Rate Range (THRR) Yes       Intervention Provide education and explanation of THRR including how the numbers were predicted and where they are located for reference       Expected Outcomes Short Term: Able to state/look up THRR;Short Term: Able to use daily as guideline for intensity in rehab;Long Term: Able to use THRR to govern intensity when exercising independently       Able to check pulse independently Yes       Intervention Provide education and demonstration on how to check pulse in carotid and radial arteries.;Review the importance of being able to check your own pulse for safety during independent exercise       Expected Outcomes Short Term: Able to explain why pulse checking is important during independent exercise;Long Term: Able to check pulse independently and accurately       Understanding of Exercise Prescription Yes       Intervention Provide education, explanation, and written materials on patient's individual exercise prescription       Expected Outcomes Short Term: Able to explain program exercise prescription;Long Term: Able to explain home exercise prescription to exercise independently          Exercise Goals Re-Evaluation :  Exercise Goals Re-Evaluation     Row Name 01/06/24 1045 01/14/24 1805 01/26/24 1527 02/11/24 1813 02/25/24 1629     Exercise Goal Re-Evaluation   Exercise Goals Review Able to understand and use rate of perceived exertion (RPE) scale;Knowledge and understanding of Target Heart Rate Range (THRR);Understanding of Exercise Prescription;Able to understand and use Dyspnea scale Increase Physical Activity;Understanding of Exercise Prescription;Increase Strength and Stamina Increase Physical Activity;Understanding of Exercise Prescription;Increase Strength and Stamina Increase Physical Activity;Understanding of Exercise Prescription;Increase Strength and Stamina Increase  Physical Activity;Understanding of Exercise Prescription;Increase Strength and Stamina   Comments Reviewed RPE and dyspnea scale, THR and program prescription with pt today.  Pt voiced understanding and was given a  copy of goals to take home. Ronni completed her first exercise session in this review. She was able to do level 2 on the T4 nustep and 14 laps on the track. We will continue to monitor her progress in the program. Glenice is doing well in the rehab. She improved to level 3 on the T5 nustep, level 4 on the T4 nustep, level 5 on the recumbent bike, and level 6 on the XR. She also began using the treadmill at a speed of 2.5 mph with no incline. We will continue to monitor her progress in the program. Arlean is doing well in the rehab. She increased her workload on the treadmill to a speed of 2.6 mph and incline of 0.5%. She maintained level 4 on the T4 nustep, level 3 on the T5 nustep, and level 5 on the recumbent bike. We will continue to monitor her progress in the program. Lakitha continues to do well in rehab. She recently improved to level 8 on the recumbent bike. She has not done any walking on the treadmill since the last review. We will continue to monitor her progress in the program.   Expected Outcomes Short: Use RPE daily to regulate intensity. Long: Follow program prescription in THR. Short: Continue to follow current exercise prescription. Long: Continue exercise to improve strength and stamina. Short: Continue to progressively increase treadmill workload. Long: Continue exercise to improve strength and stamina. Short: Try level 4 on the T5 nustep. Long: Continue exercise to improve strength and stamina. Short: Return to walking on the treadmill in the program. Long: Continue exercise to improve strength and stamina.    Row Name 03/09/24 9045 03/10/24 0924 03/24/24 1535 03/24/24 1536 03/24/24 1539     Exercise Goal Re-Evaluation   Exercise Goals Review Increase Physical Activity;Able to  understand and use rate of perceived exertion (RPE) scale;Knowledge and understanding of Target Heart Rate Range (THRR);Understanding of Exercise Prescription;Increase Strength and Stamina;Able to understand and use Dyspnea scale;Able to check pulse independently Increase Physical Activity;Increase Strength and Stamina;Understanding of Exercise Prescription Increase Physical Activity;Increase Strength and Stamina;Understanding of Exercise Prescription -- --   Comments Reviewed home exercise with pt today.  Pt plans to walk for exercise.  Reviewed THR, pulse, RPE, sign and symptoms, pulse oximetery and when to call 911 or MD.  Also discussed weather considerations and indoor options.  Pt voiced understanding. Lorra is doing well in rehab. She recently improved to level 4 on the T5 nustep, and continued to work at level 8 on the recumbent bike. She has not done any walking on the treadmill since the last review. We will continue to monitor her progress in the program. Tymber continues to do well with her exercise progression.  She Valary continues to do well with her exercise progression.  She increased the RB from level 1 to level 8. T5 from level 1 to level 4, BS from level 1 to level 2 and T4 from levle 2 to level 3. Maintained Bike at level 1. Jaileen continues to do well with her exercise progression.  She increased the RB from level 1 to level 8. T5 from level 1 to level 4, BS from level 1 to level 2 and T4 from levle 2 to level 3. Maintained Bike at level 1.  WIll continue to monitor her progress   Expected Outcomes Short: add 1-2 days a week of home exercise on off days of cardiac rehab. Long: maintain independent exercise routine upon graduation from cardiac rehab. Short: Return  to walking the treadmill in rehab. Long: Continue exercise to improve strength and stamina. -- -- STG continue with good attendance, exercise progression as tolerated  LTG exercise progression continues after discharge    Row Name  04/06/24 0928 04/20/24 1118 05/04/24 0908 05/19/24 0804 05/31/24 1407     Exercise Goal Re-Evaluation   Exercise Goals Review Increase Physical Activity;Increase Strength and Stamina;Understanding of Exercise Prescription Increase Physical Activity;Increase Strength and Stamina;Understanding of Exercise Prescription Increase Physical Activity;Increase Strength and Stamina;Understanding of Exercise Prescription Increase Physical Activity;Increase Strength and Stamina;Understanding of Exercise Prescription Increase Physical Activity;Increase Strength and Stamina;Understanding of Exercise Prescription   Comments Ronette is doing well in rehab. She was able to use the XR at level 9 and was also able to maintain a treadmill workload of 2.6mph and 2.5% incline. She was also able to add the rowing machine at level 10. We will continue to monitor her progress in the program. Cruz has not attended rehab since 07/09. We will stay in contact with her to determine her status in the program. Allesha has returned to rehab and is doing well. She was able to attend one session during this review period. During her session she was able to increase from level 4 to 5 on the T5 nustep. We will continue to monitor her progress in the program. Nicloe has not attended rehab since the last review. She last attended on 08/27. We will contact her to see when she plans to return. We will continue to monitor her progress when she returns to the program. Rose-Marie has only attended two sessions since the last review. During her two sessions she improved to level 5 on the T4 nustep and level 10 on the recumbent bike. We will continue to monitor her progress in the program.   Expected Outcomes Short: Improve on . Long: Continue exercise to improve strength and stamina. Short: Return to rehab. Long: Continue exercise to improve strength and stamina. Short: Attend rehab more frequently. Long: Continue exercise to improve strength and stamina.  Short: Return to rehab when appropriate. Long: Graduate. Short: Attend rehab more consistently. Long: Continue exercise to improve strength and stamina.    Row Name 06/15/24 1006 06/16/24 0946 06/29/24 0930 06/29/24 0932       Exercise Goal Re-Evaluation   Exercise Goals Review Increase Physical Activity;Increase Strength and Stamina;Understanding of Exercise Prescription Increase Physical Activity;Increase Strength and Stamina;Understanding of Exercise Prescription -- Increase Physical Activity;Increase Strength and Stamina;Understanding of Exercise Prescription    Comments Nikya is back at rehab after procedure and hoping to be more consistent. She is not doing much home exercise since her procedure Oaklynn has only attended one session since the last review. She continued to work on both the T4 nustep and recumbent bike during her one session. She also continues to use 5 lb hand weights for resistance training. We will continue to monitor her progress in the program. Laticha is graduating Trishia is graduating today from cardiac rehab. She also improved by 18.35% on her walk test. She plans to use her exercise equipment at home and possibly join the [employer] [facility].    Expected Outcomes STG: Attend rehab more consistently. LTG: Continue exercise to improve strength and stamina. Short: Attend rehab more consistently. Long: Continue exercise to improve strength and stamina. -- Short: graduate. Long: maintain independent exercise routine.       Discharge Exercise Prescription (Final Exercise Prescription Changes):  Exercise Prescription Changes - 06/16/24 0900       Response  to Exercise   Blood Pressure (Admit) 120/72    Blood Pressure (Exit) 122/80    Heart Rate (Admit) 76 bpm    Heart Rate (Exercise) 115 bpm    Heart Rate (Exit) 53 bpm    Rating of Perceived Exertion (Exercise) 11    Symptoms none    Duration Continue with 30 min of aerobic exercise without signs/symptoms of physical  distress.    Intensity THRR unchanged      Progression   Progression Continue to progress workloads to maintain intensity without signs/symptoms of physical distress.    Average METs 5.13      Resistance Training   Training Prescription Yes    Weight 5 lb    Reps 10-15      Interval Training   Interval Training No      Recumbant Bike   Level 3    Watts 80    Minutes 15    METs 5.26      NuStep   Level 4    Minutes 15    METs 5      Home Exercise Plan   Plans to continue exercise at Home (comment)   walk   Frequency Add 2 additional days to program exercise sessions.    Initial Home Exercises Provided 03/09/24      Oxygen   Maintain Oxygen Saturation 88% or higher          Nutrition:  Target Goals: Understanding of nutrition guidelines, daily intake of sodium 1500mg , cholesterol 200mg , calories 30% from fat and 7% or less from saturated fats, daily to have 5 or more servings of fruits and vegetables.  Education: Nutrition 1 -Group instruction provided by verbal, written material, interactive activities, discussions, models, and posters to present general guidelines for heart healthy nutrition including macronutrients, label reading, and promoting whole foods over processed counterparts. Education serves as Pensions consultant of discussion of heart healthy eating for all. Written material provided at class time.    Education: Nutrition 2 -Group instruction provided by verbal, written material, interactive activities, discussions, models, and posters to present general guidelines for heart healthy nutrition including sodium, cholesterol, and saturated fat. Providing guidance of habit forming to improve blood pressure, cholesterol, and body weight. Written material provided at class time.     Biometrics:  Pre Biometrics - 01/04/24 1357       Pre Biometrics   Height 5' 5.39 (1.661 m)    Weight 171 lb 6.4 oz (77.7 kg)    Waist Circumference 33 inches    Hip  Circumference 43.5 inches    Waist to Hip Ratio 0.76 %    BMI (Calculated) 28.18    Single Leg Stand 30 seconds          Post Biometrics - 06/22/24 0956        Post  Biometrics   Height 5' 5.39 (1.661 m)    Weight 169 lb (76.7 kg)    Waist Circumference 36 inches    Hip Circumference 43.2 inches    Waist to Hip Ratio 0.83 %    BMI (Calculated) 27.79    Single Leg Stand 30 seconds          Nutrition Therapy Plan and Nutrition Goals:  Nutrition Therapy & Goals - 01/06/24 1115       Nutrition Therapy   Diet Cardiac, Low Na    Protein (specify units) 80g    Fiber 25 grams    Whole Grain Foods 3 servings  Saturated Fats 15 max. grams    Fruits and Vegetables 5 servings/day    Sodium 2 grams      Personal Nutrition Goals   Nutrition Goal Read labels and reduce sodium intake to below 2300mg . Ideally 1500mg  per day.    Personal Goal #2 Eat 15-30gProtein and 30-60gCarbs at each meal.    Personal Goal #3 Reduce saturated fat, less than 12g per day. Replace bad fats for more heart healthy fats.    Comments Patient drinking mostly water but sometimes has a sweet beverage. She reports she has been working on cutting back on the sugary beverages. She reports eating healthy choices mostly, says sometimes she snacks at night on popcorn with butter or cookies. Sometimes she might cook eggs and bacon for breakfast on weekends but not every week. Encouraged her to enjoy comfort foods from time to time, but should not be regular or routine. Reviewed mediterranean diet handout. Educated on types of fats, sources, how to read labels. Provided guidelines on sodium and saturated fat, recommending she stay below 1500mg  sodium and 15gSat fat. Brainstormed some meal and snack ideas with foods she likes and will eat focusing on balanced plates with adequate protein and controlled sodium.      Intervention Plan   Intervention Prescribe, educate and counsel regarding individualized specific dietary  modifications aiming towards targeted core components such as weight, hypertension, lipid management, diabetes, heart failure and other comorbidities.;Nutrition handout(s) given to patient.    Expected Outcomes Short Term Goal: Understand basic principles of dietary content, such as calories, fat, sodium, cholesterol and nutrients.;Short Term Goal: A plan has been developed with personal nutrition goals set during dietitian appointment.;Long Term Goal: Adherence to prescribed nutrition plan.          Nutrition Assessments:  MEDIFICTS Score Key: >=70 Need to make dietary changes  40-70 Heart Healthy Diet <= 40 Therapeutic Level Cholesterol Diet  Flowsheet Row Cardiac Rehab from 06/23/2024 in [facility] Cardiac and Pulmonary Rehab  Picture Your Plate Total Score on Admission 61  Picture Your Plate Total Score on Discharge 50   Picture Your Plate Scores: <59 Unhealthy dietary pattern with much room for improvement. 41-50 Dietary pattern unlikely to meet recommendations for good health and room for improvement. 51-60 More healthful dietary pattern, with some room for improvement.  >60 Healthy dietary pattern, although there may be some specific behaviors that could be improved.    Nutrition Goals Re-Evaluation:  Nutrition Goals Re-Evaluation     Row Name 03/09/24 0956 06/15/24 1022 06/29/24 0933         Goals   Comment Kelee reports that she is reading food labels and being mindful of what she is eating. She mostly is trying to eat lean protein and fresh vegetables to make sure she has balanced meals. She is also making an effort to drink more water. Spoke with Cody about eatting enought protein and making sure she is reading facts labels to prevent over eating in sodium or saturated fats. Alaila reports that she has become comfortable reading food labels and decreasing her sodium as part of her daily routine. She plans to continue to watch her sodium and eat a heart healthy diet upon  graduation from cardiac rehab.     Expected Outcome Short: continue to get comfortable with reading food labels. Long: maintain heart healthy diet long term to help control cardiac risk factors. STG: Read labels and keep sodium below 1500mg  per day. LTG follow a heart healthy lifestyle Short: Graduate  Long: maintain heart healthy diet upon graduation from cardiac rehab.        Nutrition Goals Discharge (Final Nutrition Goals Re-Evaluation):  Nutrition Goals Re-Evaluation - 06/29/24 0933       Goals   Comment Xochil reports that she has become comfortable reading food labels and decreasing her sodium as part of her daily routine. She plans to continue to watch her sodium and eat a heart healthy diet upon graduation from cardiac rehab.    Expected Outcome Short: Graduate  Long: maintain heart healthy diet upon graduation from cardiac rehab.          Psychosocial: Target Goals: Acknowledge presence or absence of significant depression and/or stress, maximize coping skills, provide positive support system. Participant is able to verbalize types and ability to use techniques and skills needed for reducing stress and depression.   Education: Stress, Anxiety, and Depression - Group verbal and visual presentation to define topics covered.  Reviews how body is impacted by stress, anxiety, and depression.  Also discusses healthy ways to reduce stress and to treat/manage anxiety and depression. Written material provided at class time. Flowsheet Row Cardiac Rehab from 06/29/2024 in [facility] Cardiac and Pulmonary Rehab  Date 01/27/24  Educator [facility]  Instruction Review Code 1- [employer] Understanding    Education: Sleep Hygiene -Provides group verbal and written instruction about how sleep can affect your health.  Define sleep hygiene, discuss sleep cycles and impact of sleep habits. Review good sleep hygiene tips.   Initial Review & Psychosocial Screening:   Quality of Life Scores:   Quality of Life  - 06/23/24 0753       Quality of Life   Select Quality of Life      Quality of Life Scores   Health/Function Pre 25.86 %    Health/Function Post 27 %    Health/Function % Change 4.41 %    Socioeconomic Pre 22.56 %    Socioeconomic Post 27 %    Socioeconomic % Change  19.68 %    Psych/Spiritual Pre 25.75 %    Psych/Spiritual Post 29.14 %    Psych/Spiritual % Change 13.17 %    Family Pre 24.9 %    Family Post 27.6 %    Family % Change 10.84 %    GLOBAL Pre 24.89 %    GLOBAL Post 27.53 %    GLOBAL % Change 10.61 %         Scores of 19 and below usually indicate a poorer quality of life in these areas.  A difference of  2-3 points is a clinically meaningful difference.  A difference of 2-3 points in the total score of the Quality of Life Index has been associated with significant improvement in overall quality of life, self-image, physical symptoms, and general health in studies assessing change in quality of life.  PHQ-9: Review Flowsheet       06/23/2024 01/04/2024 04/22/2016  Depression screen PHQ 2/9  Decreased Interest 1 0 0  Down, Depressed, Hopeless 0 0 0  PHQ - 2 Score 1 0 0  Altered sleeping 1 1 0  Tired, decreased energy 1 0 1  Change in appetite 0 0 1  Feeling bad or failure about yourself  0 0 0  Trouble concentrating 0 0 0  Moving slowly or fidgety/restless 0 0 0  Suicidal thoughts 0 0 0   PHQ-9 Score 3 1 2   Difficult doing work/chores Not difficult at all Not difficult at all Not difficult at all  Details       Data saved with a previous flowsheet row definition        Interpretation of Total Score  Total Score Depression Severity:  1-4 = Minimal depression, 5-9 = Mild depression, 10-14 = Moderate depression, 15-19 = Moderately severe depression, 20-27 = Severe depression   Psychosocial Evaluation and Intervention:   Psychosocial Re-Evaluation:  Psychosocial Re-Evaluation     Row Name 03/09/24 1000 06/15/24 1021           Psychosocial  Re-Evaluation   Current issues with None Identified Current Sleep Concerns      Comments Deyci reports that she has no concerns with stress or mental health effecting her ability to function in life. She does report that she does not sleep well all the time. She has a good support system and healthy ways of managing stress. Stassi denies any anxiety, depression, or stress at this time. She is struggling to sleep more than 5hours a night. Says this has been a struggle for her for years.      Expected Outcomes Short: continue to attend cardiac rehab for mental health benefits of exercise. Long: maintain good mental health routine. Short: continue to attend cardiac rehab for mental health benefits of exercise. Long: maintain good mental health routine      Interventions Encouraged to attend Cardiac Rehabilitation for the exercise Encouraged to attend Cardiac Rehabilitation for the exercise      Continue Psychosocial Services  Follow up required by staff Follow up required by staff         Psychosocial Discharge (Final Psychosocial Re-Evaluation):  Psychosocial Re-Evaluation - 06/15/24 1021       Psychosocial Re-Evaluation   Current issues with Current Sleep Concerns    Comments Kyara denies any anxiety, depression, or stress at this time. She is struggling to sleep more than 5hours a night. Says this has been a struggle for her for years.    Expected Outcomes Short: continue to attend cardiac rehab for mental health benefits of exercise. Long: maintain good mental health routine    Interventions Encouraged to attend Cardiac Rehabilitation for the exercise    Continue Psychosocial Services  Follow up required by staff          Vocational Rehabilitation: Provide vocational rehab assistance to qualifying candidates.   Vocational Rehab Evaluation & Intervention:   Education: Education Goals: Education classes will be provided on a variety of topics geared toward better understanding of heart  health and risk factor modification. Participant will state understanding/return demonstration of topics presented as noted by education test scores.  Learning Barriers/Preferences:   General Cardiac Education Topics:  AED/CPR: - Group verbal and written instruction with the use of models to demonstrate the basic use of the AED with the basic ABC's of resuscitation.   Test and Procedures: - Group verbal and visual presentation and models provide information about basic cardiac anatomy and function. Reviews the testing methods done to diagnose heart disease and the outcomes of the test results. Describes the treatment choices: Medical Management, Angioplasty, or Coronary Bypass Surgery for treating various heart conditions including Myocardial Infarction, Angina, Valve Disease, and Cardiac Arrhythmias. Written material provided at class time. Flowsheet Row Cardiac Rehab from 06/29/2024 in [facility] Cardiac and Pulmonary Rehab  Date 03/09/24  Educator SB  Instruction Review Code 1- Verbalizes Understanding    Medication Safety: - Group verbal and visual instruction to review commonly prescribed medications for heart and lung disease. Reviews the medication, class of the  drug, and side effects. Includes the steps to properly store meds and maintain the prescription regimen. Written material provided at class time.   Intimacy: - Group verbal instruction through game format to discuss how heart and lung disease can affect sexual intimacy. Written material provided at class time.   Know Your Numbers and Heart Failure: - Group verbal and visual instruction to discuss disease risk factors for cardiac and pulmonary disease and treatment options.  Reviews associated critical values for Overweight/Obesity, Hypertension, Cholesterol, and Diabetes.  Discusses basics of heart failure: signs/symptoms and treatments.  Introduces Heart Failure Zone chart for action plan for heart failure. Written material  provided at class time. Flowsheet Row Cardiac Rehab from 06/29/2024 in [facility] Cardiac and Pulmonary Rehab  Date 01/13/24  Educator [facility]  Instruction Review Code 1- Verbalizes Understanding    Infection Prevention: - Provides verbal and written material to individual with discussion of infection control including proper hand washing and proper equipment cleaning during exercise session. Flowsheet Row Cardiac Rehab from 06/29/2024 in [facility] Cardiac and Pulmonary Rehab  Date 01/04/24  Educator MB  Instruction Review Code 1- Verbalizes Understanding    Falls Prevention: - Provides verbal and written material to individual with discussion of falls prevention and safety. Flowsheet Row Cardiac Rehab from 06/29/2024 in [facility] Cardiac and Pulmonary Rehab  Date 01/04/24  Educator MB  Instruction Review Code 1- Verbalizes Understanding    Other: -Provides group and verbal instruction on various topics (see comments)   Knowledge Questionnaire Score:  Knowledge Questionnaire Score - 06/23/24 0753       Knowledge Questionnaire Score   Pre Score 25/26    Post Score 24/26          Core Components/Risk Factors/Patient Goals at Admission:  Personal Goals and Risk Factors at Admission - 01/04/24 1359       Core Components/Risk Factors/Patient Goals on Admission    Weight Management Yes;Weight Loss    Intervention Weight Management: Develop a combined nutrition and exercise program designed to reach desired caloric intake, while maintaining appropriate intake of nutrient and fiber, sodium and fats, and appropriate energy expenditure required for the weight goal.;Weight Management: Provide education and appropriate resources to help participant work on and attain dietary goals.;Weight Management/Obesity: Establish reasonable short term and long term weight goals.    Admit Weight 171 lb 6.4 oz (77.7 kg)    Goal Weight: Short Term 158 lb 3.2 oz (71.8 kg)    Goal Weight: Long Term 145 lb (65.8 kg)     Expected Outcomes Short Term: Continue to assess and modify interventions until short term weight is achieved;Long Term: Adherence to nutrition and physical activity/exercise program aimed toward attainment of established weight goal;Understanding recommendations for meals to include 15-35% energy as protein, 25-35% energy from fat, 35-60% energy from carbohydrates, less than 200mg  of dietary cholesterol, 20-35 gm of total fiber daily;Understanding of distribution of calorie intake throughout the day with the consumption of 4-5 meals/snacks;Weight Loss: Understanding of general recommendations for a balanced deficit meal plan, which promotes 1-2 lb weight loss per week and includes a negative energy balance of [phone] kcal/d    Heart Failure Yes    Intervention Provide a combined exercise and nutrition program that is supplemented with education, support and counseling about heart failure. Directed toward relieving symptoms such as shortness of breath, decreased exercise tolerance, and extremity edema.    Expected Outcomes Improve functional capacity of life;Short term: Attendance in program 2-3 days a week with increased exercise capacity.  Reported lower sodium intake. Reported increased fruit and vegetable intake. Reports medication compliance.;Short term: Daily weights obtained and reported for increase. Utilizing diuretic protocols set by physician.;Long term: Adoption of self-care skills and reduction of barriers for early signs and symptoms recognition and intervention leading to self-care maintenance.    Hypertension Yes    Intervention Provide education on lifestyle modifcations including regular physical activity/exercise, weight management, moderate sodium restriction and increased consumption of fresh fruit, vegetables, and low fat dairy, alcohol moderation, and smoking cessation.;Monitor prescription use compliance.    Expected Outcomes Short Term: Continued assessment and intervention until BP is  < 140/44mm HG in hypertensive participants. < 130/26mm HG in hypertensive participants with diabetes, heart failure or chronic kidney disease.;Long Term: Maintenance of blood pressure at goal levels.    Lipids Yes    Intervention Provide education and support for participant on nutrition & aerobic/resistive exercise along with prescribed medications to achieve LDL 70mg , HDL >40mg .    Expected Outcomes Short Term: Participant states understanding of desired cholesterol values and is compliant with medications prescribed. Participant is following exercise prescription and nutrition guidelines.;Long Term: Cholesterol controlled with medications as prescribed, with individualized exercise RX and with personalized nutrition plan. Value goals: LDL < 70mg , HDL > 40 mg.          Education:Diabetes - Individual verbal and written instruction to review signs/symptoms of diabetes, desired ranges of glucose level fasting, after meals and with exercise. Acknowledge that pre and post exercise glucose checks will be done for 3 sessions at entry of program.   Core Components/Risk Factors/Patient Goals Review:   Goals and Risk Factor Review     Row Name 03/09/24 0958 06/15/24 1023           Core Components/Risk Factors/Patient Goals Review   Personal Goals Review Weight Management/Obesity;Heart Failure;Lipids Hypertension;Weight Management/Obesity      Review Brelynn stated that her weight has been steady. She still wants to work towards her weight loss goal, but the importance of weighing daily and monitoring for symptoms of heart failure was reviewed. She continues to take all her cholesterol medication as well and follows up with her doctor for regular appointments and lab work. Tari reports that she checks her BP at home. Takes her meds as prescribed. Says she noticed her weight fluctuating some lately but thinks it is likely due to water weight.      Expected Outcomes Short: weight daily and monitor for  signs of heart failure. Long: continue to control cardiac risk factors. Short: weight daily and monitor for signs of heart failure. Long: continue to control cardiac risk factors.         Core Components/Risk Factors/Patient Goals at Discharge (Final Review):   Goals and Risk Factor Review - 06/15/24 1023       Core Components/Risk Factors/Patient Goals Review   Personal Goals Review Hypertension;Weight Management/Obesity    Review Bridgette reports that she checks her BP at home. Takes her meds as prescribed. Says she noticed her weight fluctuating some lately but thinks it is likely due to water weight.    Expected Outcomes Short: weight daily and monitor for signs of heart failure. Long: continue to control cardiac risk factors.          ITP Comments:  ITP Comments     Row Name 01/04/24 1350 01/06/24 1045 01/27/24 1317 02/24/24 1111 03/23/24 1120   ITP Comments Completed and gym orientation for cardiac rehab. Initial ITP created and sent for review to  Dr. Oneil Pinal, Medical Director. First full day of exercise!  Patient was oriented to gym and equipment including functions, settings, policies, and procedures.  Patient's individual exercise prescription and treatment plan were reviewed.  All starting workloads were established based on the results of the 6 minute walk test done at initial orientation visit.  The plan for exercise progression was also introduced and progression will be customized based on patient's performance and goals. 30 Day review completed. Medical Director ITP review done, changes made as directed, and signed approval by Medical Director. New to program. 30 Day review completed. Medical Director ITP review done, changes made as directed, and signed approval by Medical Director. 30 Day review completed. Medical Director ITP review done, changes made as directed, and signed approval by Medical Director.    Row Name 04/20/24 1017 05/18/24 1053 06/15/24 1126 06/29/24  0940     ITP Comments 30 Day review completed. Medical Director ITP review done, changes made as directed, and signed approval by Medical Director. 30 Day review completed. Medical Director ITP review done, changes made as directed, and signed approval by Medical Director. 30 Day review completed. Medical Director ITP review done, changes made as directed, and signed approval by Medical Director. Karlynn graduated today from  rehab with 36 sessions completed.  Details of the patient's exercise prescription and what She needs to do in order to continue the prescription and progress were discussed with patient.  Patient was given a copy of prescription and goals.  Patient verbalized understanding. Ryanne plans to continue to exercise by walking at home.       Comments: discharge ITP

## 2024-06-29 NOTE — Progress Notes (Signed)
 Discharge Summary Patient: [name] DOB: 10/17/1950  Sara Hodges graduated today from  rehab with 36 sessions completed.  Details of the patient's exercise prescription and what She needs to do in order to continue the prescription and progress were discussed with patient.  Patient was given a copy of prescription and goals.  Patient verbalized understanding. Sara Hodges plans to continue to exercise by walking at home.   6 Minute Walk     Row Name 01/04/24 1350 06/22/24 0950       6 Minute Walk   Phase Initial Discharge    Distance 1390 feet 1645 feet    Distance % Change -- 18.35 %    Distance Feet Change -- 255 ft    Walk Time 6 minutes 6 minutes    # of Rest Breaks 0 0    MPH 2.63 3.12    METS 2.83 3.52    RPE 9 12    Perceived Dyspnea  0 0    VO2 Peak 9.89 12.31    Symptoms No Yes (comment)    Comments -- Nausea/got hot at end of test    Resting HR 69 bpm 69 bpm    Resting BP 108/70 130/70    Resting Oxygen Saturation  99 % 100 %    Exercise Oxygen Saturation  during 6 min walk 100 % 100 %    Max Ex. HR 90 bpm 105 bpm    Max Ex. BP 146/68 154/64    2 Minute Post BP 118/66 126/66

## 2024-06-29 NOTE — Progress Notes (Signed)
 Daily Session Note  Patient Details  Name: Sara Hodges MRN: [id number] Date of Birth: August 12, 1951 Referring Provider:   Flowsheet Row Cardiac Rehab from 01/04/2024 in [facility]  Referring Provider Clarinda Sharper MD    Encounter Date: 06/29/2024  Check In:  Session Check In - 06/29/24 0939       Check-In   Supervising physician immediately available to respond to emergencies See telemetry face sheet for immediately available ER MD    Location [facility]-Cardiac & Pulmonary Rehab    Staff Present Burnard Davenport [facility], RN, DNP, NE-BC;Joseph [facility] RN,BSN;Margaret Best, MS, Exercise Physiologist;Oaklynn Stierwalt Dyane HECKLE, ACSM CEP, Exercise Physiologist    Virtual Visit No    Medication changes reported     No    Fall or balance concerns reported    No    Warm-up and Cool-down Performed on first and last piece of equipment    Resistance Training Performed Yes    VAD Patient? No    PAD/SET Patient? No      Pain Assessment   Currently in Pain? No/denies             Social History   Tobacco Use  Smoking Status Never  Smokeless Tobacco Never    Goals Met:  Independence with exercise equipment Exercise tolerated well No report of concerns or symptoms today Strength training completed today  Goals Unmet:  Not Applicable  Comments:  Ortha graduated today from  rehab with 36 sessions completed.  Details of the patient's exercise prescription and what She needs to do in order to continue the prescription and progress were discussed with patient.  Patient was given a copy of prescription and goals.  Patient verbalized understanding. [name] plans to continue to exercise by walking at home.    Dr. Oneil Pinal is Medical Director for [facility] Cardiac Rehabilitation.  Dr. Fuad Aleskerov is Medical Director for [facility] Pulmonary Rehabilitation.

## 2024-06-30 ENCOUNTER — Encounter

## 2024-07-06 ENCOUNTER — Encounter

## 2024-07-07 ENCOUNTER — Encounter

## 2024-07-13 ENCOUNTER — Encounter

## 2024-07-14 ENCOUNTER — Encounter

## 2024-08-15 ENCOUNTER — Other Ambulatory Visit: Payer: Self-pay | Admitting: Surgery

## 2024-08-15 ENCOUNTER — Encounter: Payer: Self-pay | Admitting: Surgery

## 2024-08-15 DIAGNOSIS — N611 Abscess of the breast and nipple: Secondary | ICD-10-CM

## 2024-08-15 DIAGNOSIS — Z1231 Encounter for screening mammogram for malignant neoplasm of breast: Secondary | ICD-10-CM

## 2024-08-17 ENCOUNTER — Ambulatory Visit
Admission: RE | Admit: 2024-08-17 | Discharge: 2024-08-17 | Disposition: A | Discharge status: Home / Self Care | Source: Ambulatory Visit | Attending: Surgery | Admitting: Surgery

## 2024-08-17 DIAGNOSIS — N611 Abscess of the breast and nipple: Secondary | ICD-10-CM | POA: Insufficient documentation

## 2024-08-17 DIAGNOSIS — Z1231 Encounter for screening mammogram for malignant neoplasm of breast: Secondary | ICD-10-CM | POA: Diagnosis present
